# Patient Record
Sex: Male | Born: 1960 | Race: Black or African American | Hispanic: No | Smoking: Never smoker
Health system: Southern US, Community
[De-identification: ages and names within clinical notes are randomized; demographics above are authoritative.]

## PROBLEM LIST (undated history)

## (undated) DIAGNOSIS — F419 Anxiety disorder, unspecified: Secondary | ICD-10-CM

## (undated) DIAGNOSIS — I10 Essential (primary) hypertension: Secondary | ICD-10-CM

## (undated) DIAGNOSIS — R21 Rash and other nonspecific skin eruption: Secondary | ICD-10-CM

## (undated) DIAGNOSIS — N529 Male erectile dysfunction, unspecified: Secondary | ICD-10-CM

## (undated) HISTORY — DX: Rash and other nonspecific skin eruption: R21

## (undated) HISTORY — DX: Male erectile dysfunction, unspecified: N52.9

## (undated) HISTORY — DX: Essential (primary) hypertension: I10

## (undated) HISTORY — DX: Anxiety disorder, unspecified: F41.9

---

## 2016-09-29 DIAGNOSIS — R0902 Hypoxemia: Secondary | ICD-10-CM | POA: Diagnosis not present

## 2016-09-30 DIAGNOSIS — D571 Sickle-cell disease without crisis: Secondary | ICD-10-CM | POA: Diagnosis not present

## 2016-09-30 DIAGNOSIS — R0902 Hypoxemia: Secondary | ICD-10-CM | POA: Diagnosis not present

## 2016-10-04 DIAGNOSIS — Q8901 Asplenia (congenital): Secondary | ICD-10-CM | POA: Diagnosis not present

## 2016-10-04 DIAGNOSIS — R040 Epistaxis: Secondary | ICD-10-CM | POA: Diagnosis not present

## 2016-10-04 DIAGNOSIS — D571 Sickle-cell disease without crisis: Secondary | ICD-10-CM | POA: Diagnosis not present

## 2016-10-04 DIAGNOSIS — R931 Abnormal findings on diagnostic imaging of heart and coronary circulation: Secondary | ICD-10-CM | POA: Diagnosis not present

## 2016-10-04 DIAGNOSIS — Z7722 Contact with and (suspected) exposure to environmental tobacco smoke (acute) (chronic): Secondary | ICD-10-CM | POA: Diagnosis not present

## 2016-10-04 DIAGNOSIS — D5740 Sickle-cell thalassemia without crisis: Secondary | ICD-10-CM | POA: Diagnosis not present

## 2016-10-04 DIAGNOSIS — J4520 Mild intermittent asthma, uncomplicated: Secondary | ICD-10-CM | POA: Diagnosis not present

## 2016-11-02 DIAGNOSIS — Z5189 Encounter for other specified aftercare: Secondary | ICD-10-CM | POA: Diagnosis not present

## 2016-11-02 DIAGNOSIS — Q8901 Asplenia (congenital): Secondary | ICD-10-CM | POA: Diagnosis not present

## 2016-11-02 DIAGNOSIS — D571 Sickle-cell disease without crisis: Secondary | ICD-10-CM | POA: Diagnosis not present

## 2016-11-02 DIAGNOSIS — R040 Epistaxis: Secondary | ICD-10-CM | POA: Diagnosis not present

## 2016-11-29 DIAGNOSIS — D571 Sickle-cell disease without crisis: Secondary | ICD-10-CM | POA: Diagnosis not present

## 2016-11-30 DIAGNOSIS — D571 Sickle-cell disease without crisis: Secondary | ICD-10-CM | POA: Diagnosis not present

## 2016-12-11 DIAGNOSIS — R1110 Vomiting, unspecified: Secondary | ICD-10-CM | POA: Diagnosis not present

## 2016-12-11 DIAGNOSIS — R040 Epistaxis: Secondary | ICD-10-CM | POA: Diagnosis not present

## 2016-12-11 DIAGNOSIS — R509 Fever, unspecified: Secondary | ICD-10-CM | POA: Diagnosis not present

## 2016-12-11 DIAGNOSIS — R0981 Nasal congestion: Secondary | ICD-10-CM | POA: Diagnosis not present

## 2016-12-26 DIAGNOSIS — D571 Sickle-cell disease without crisis: Secondary | ICD-10-CM | POA: Diagnosis not present

## 2016-12-27 DIAGNOSIS — R040 Epistaxis: Secondary | ICD-10-CM | POA: Diagnosis not present

## 2016-12-27 DIAGNOSIS — Z5189 Encounter for other specified aftercare: Secondary | ICD-10-CM | POA: Diagnosis not present

## 2016-12-27 DIAGNOSIS — D571 Sickle-cell disease without crisis: Secondary | ICD-10-CM | POA: Diagnosis not present

## 2016-12-27 DIAGNOSIS — Q8901 Asplenia (congenital): Secondary | ICD-10-CM | POA: Diagnosis not present

## 2016-12-28 DIAGNOSIS — D571 Sickle-cell disease without crisis: Secondary | ICD-10-CM | POA: Diagnosis not present

## 2017-01-24 DIAGNOSIS — Q8901 Asplenia (congenital): Secondary | ICD-10-CM | POA: Diagnosis not present

## 2017-01-24 DIAGNOSIS — Z79899 Other long term (current) drug therapy: Secondary | ICD-10-CM | POA: Diagnosis not present

## 2017-01-24 DIAGNOSIS — D571 Sickle-cell disease without crisis: Secondary | ICD-10-CM | POA: Diagnosis not present

## 2017-01-24 DIAGNOSIS — R9439 Abnormal result of other cardiovascular function study: Secondary | ICD-10-CM | POA: Diagnosis not present

## 2017-01-24 DIAGNOSIS — R931 Abnormal findings on diagnostic imaging of heart and coronary circulation: Secondary | ICD-10-CM | POA: Diagnosis not present

## 2017-01-24 DIAGNOSIS — Z52008 Unspecified donor, other blood: Secondary | ICD-10-CM | POA: Diagnosis not present

## 2017-01-25 DIAGNOSIS — Z79899 Other long term (current) drug therapy: Secondary | ICD-10-CM | POA: Diagnosis not present

## 2017-01-25 DIAGNOSIS — D571 Sickle-cell disease without crisis: Secondary | ICD-10-CM | POA: Diagnosis not present

## 2017-01-25 DIAGNOSIS — Z52008 Unspecified donor, other blood: Secondary | ICD-10-CM | POA: Diagnosis not present

## 2017-01-25 DIAGNOSIS — Q8901 Asplenia (congenital): Secondary | ICD-10-CM | POA: Diagnosis not present

## 2017-01-25 DIAGNOSIS — R9439 Abnormal result of other cardiovascular function study: Secondary | ICD-10-CM | POA: Diagnosis not present

## 2017-03-30 ENCOUNTER — Emergency Department (HOSPITAL_BASED_OUTPATIENT_CLINIC_OR_DEPARTMENT_OTHER): Payer: BLUE CROSS/BLUE SHIELD

## 2017-03-30 ENCOUNTER — Encounter (HOSPITAL_BASED_OUTPATIENT_CLINIC_OR_DEPARTMENT_OTHER): Payer: Self-pay

## 2017-03-30 ENCOUNTER — Other Ambulatory Visit: Payer: Self-pay

## 2017-03-30 ENCOUNTER — Emergency Department (HOSPITAL_BASED_OUTPATIENT_CLINIC_OR_DEPARTMENT_OTHER)
Admission: EM | Admit: 2017-03-30 | Discharge: 2017-03-30 | Disposition: A | Discharge status: Home / Self Care | Payer: BLUE CROSS/BLUE SHIELD | Attending: Emergency Medicine | Admitting: Emergency Medicine

## 2017-03-30 DIAGNOSIS — Y939 Activity, unspecified: Secondary | ICD-10-CM | POA: Diagnosis not present

## 2017-03-30 DIAGNOSIS — S300XXA Contusion of lower back and pelvis, initial encounter: Secondary | ICD-10-CM | POA: Insufficient documentation

## 2017-03-30 DIAGNOSIS — Y929 Unspecified place or not applicable: Secondary | ICD-10-CM | POA: Diagnosis not present

## 2017-03-30 DIAGNOSIS — Y999 Unspecified external cause status: Secondary | ICD-10-CM | POA: Insufficient documentation

## 2017-03-30 DIAGNOSIS — S8991XA Unspecified injury of right lower leg, initial encounter: Secondary | ICD-10-CM | POA: Insufficient documentation

## 2017-03-30 DIAGNOSIS — W109XXA Fall (on) (from) unspecified stairs and steps, initial encounter: Secondary | ICD-10-CM | POA: Diagnosis not present

## 2017-03-30 DIAGNOSIS — S301XXA Contusion of abdominal wall, initial encounter: Secondary | ICD-10-CM | POA: Diagnosis not present

## 2017-03-30 DIAGNOSIS — M25461 Effusion, right knee: Secondary | ICD-10-CM | POA: Diagnosis not present

## 2017-03-30 DIAGNOSIS — M25462 Effusion, left knee: Secondary | ICD-10-CM | POA: Diagnosis not present

## 2017-03-30 MED ORDER — NAPROXEN 250 MG PO TABS
500.0000 mg | ORAL_TABLET | Freq: Once | ORAL | Status: AC
  Administered 2017-03-30: 500 mg via ORAL
  Filled 2017-03-30: qty 2

## 2017-03-30 MED ORDER — NAPROXEN 500 MG PO TABS: ORAL_TABLET | ORAL | 0 refills | Status: DC

## 2017-03-30 NOTE — ED Triage Notes (Signed)
Pt tripped up the stairs two nights ago, c/o bilateral knee pain, walks with slight limp, last took ibuprofen Wednesday morning

## 2017-03-30 NOTE — ED Provider Notes (Signed)
[facility] Provider Note: Lowella DellJ. Lane Javious Hallisey, MD, FACEP  CSN: [id number] MRN: [id number] ARRIVAL: 03/30/17 at 0251 ROOM: [facility]   CHIEF COMPLAINT  Fall   HISTORY OF PRESENT ILLNESS  03/30/17 3:02 AM Scarlette CalicoRoderick Laventure is a 57 y.o. male who tripped on the stairs and fell to nights ago.  He is complaining of moderate, throbbing right knee pain that is worse with flexion and extension or weightbearing.  The pain is located primarily in the posterior medial aspect of the knee.  The knee is not unstable.  He is also having pain in his left iliac crest.  He has been taking ibuprofen intermittently without adequate relief of the pain.  He denies neck pain or back pain.   History reviewed. No pertinent past medical history.  History reviewed. No pertinent surgical history.  No family history on file.  Social History   Tobacco Use  . Smoking status: Not on file  Substance Use Topics  . Alcohol use: Not on file  . Drug use: Not on file    Prior to Admission medications   Not on File    Allergies Patient has no known allergies.   REVIEW OF SYSTEMS  Negative except as noted here or in the History of Present Illness.   PHYSICAL EXAMINATION  Initial Vital Signs Blood pressure (!) 182/122, pulse 90, temperature 99 F (37.2 C), temperature source Oral, resp. rate 20, SpO2 97 %.  Examination General: Well-developed, well-nourished male in no acute distress; appearance consistent with age of record HENT: normocephalic; atraumatic Eyes: pupils equal, round and reactive to light; extraocular muscles intact Neck: supple; nontender Heart: regular rate and rhythm Lungs: clear to auscultation bilaterally Abdomen: soft; nondistended; nontender; bowel sounds present Extremities: No deformity; right knee stable but with posterior medial pain on passive range of motion; pulses normal; tenderness over left iliac crest without deformity or crepitus Neurologic: Awake, alert and  oriented; motor function intact in all extremities and symmetric; no facial droop Skin: Warm and dry Psychiatric: Normal mood and affect   RESULTS  Summary of this visit's results, reviewed by myself:   EKG Interpretation  Date/Time:    Ventricular Rate:    PR Interval:    QRS Duration:   QT Interval:    QTC Calculation:   R Axis:     Text Interpretation:        Laboratory Studies: No results found for this or any previous visit (from the past 24 hour(s)). Imaging Studies: Dg Knee Complete 4 Views Left  Result Date: 03/30/2017 CLINICAL DATA:  Left knee pain after fall last night. EXAM: LEFT KNEE - COMPLETE 4+ VIEW COMPARISON:  None. FINDINGS: No fracture or dislocation. Moderate joint effusion. Tricompartmental osteoarthritis with peripheral spurring. Lobular osseous density projecting medially from the proximal fibula may be an osteochondroma or sequela of remote prior injury. Possible intra-articular bodies posteriorly versus capsular calcification. IMPRESSION: 1. Moderate joint effusion without acute fracture. 2. Mild to moderate tricompartmental osteoarthritis. 3. Lobular osseous density projecting medially from the proximal fibula may be an osteochondroma versus sequela of remote prior injury. Electronically Signed   By: Rubye OaksMelanie  Ehinger M.D.   On: 03/30/2017 03:37   Dg Knee Complete 4 Views Right  Result Date: 03/30/2017 CLINICAL DATA:  Fall last evening with anterior and lateral right knee pain. EXAM: RIGHT KNEE - COMPLETE 4+ VIEW COMPARISON:  None. FINDINGS: No evidence of fracture or dislocation. Moderate joint effusion. Tricompartmental osteoarthritis with peripheral spurring and mild lateral tibiofemoral joint space  narrowing. There is soft tissue edema anteriorly. IMPRESSION: 1. Joint effusion without acute fracture. 2. Mild-to-moderate tricompartmental osteoarthritis. Electronically Signed   By: Rubye Oaks M.D.   On: 03/30/2017 03:35    ED COURSE  Nursing notes  and initial vitals signs, including pulse oximetry, reviewed.  Vitals:   03/30/17 0254  BP: (!) 182/122  Pulse: 90  Resp: 20  Temp: 99 F (37.2 C)  TempSrc: Oral  SpO2: 97%   Will place in right knee immobilizer and treat with NSAIDs.  Will refer to sports medicine.  PROCEDURES    ED DIAGNOSES     ICD-10-CM   1. Fall on stairs, initial encounter W10.9XXA   2. Right knee injury, initial encounter S89.91XA   3. Contusion of iliac crest, initial encounter S30.1XXA        Barclay Lennox, Jonny Ruiz, MD 03/30/17 [phone]

## 2018-05-30 ENCOUNTER — Emergency Department (HOSPITAL_COMMUNITY)
Admission: EM | Admit: 2018-05-30 | Discharge: 2018-05-30 | Disposition: A | Discharge status: Home / Self Care | Payer: BC Managed Care – PPO | Attending: Emergency Medicine | Admitting: Emergency Medicine

## 2018-05-30 ENCOUNTER — Other Ambulatory Visit: Payer: Self-pay

## 2018-05-30 ENCOUNTER — Emergency Department (HOSPITAL_COMMUNITY): Payer: BC Managed Care – PPO

## 2018-05-30 ENCOUNTER — Encounter (HOSPITAL_COMMUNITY): Payer: Self-pay | Admitting: Emergency Medicine

## 2018-05-30 DIAGNOSIS — R05 Cough: Secondary | ICD-10-CM | POA: Diagnosis not present

## 2018-05-30 DIAGNOSIS — Z20828 Contact with and (suspected) exposure to other viral communicable diseases: Secondary | ICD-10-CM | POA: Diagnosis not present

## 2018-05-30 DIAGNOSIS — J069 Acute upper respiratory infection, unspecified: Secondary | ICD-10-CM | POA: Diagnosis not present

## 2018-05-30 DIAGNOSIS — R0602 Shortness of breath: Secondary | ICD-10-CM | POA: Diagnosis not present

## 2018-05-30 DIAGNOSIS — I10 Essential (primary) hypertension: Secondary | ICD-10-CM | POA: Insufficient documentation

## 2018-05-30 DIAGNOSIS — B9789 Other viral agents as the cause of diseases classified elsewhere: Secondary | ICD-10-CM | POA: Diagnosis not present

## 2018-05-30 LAB — CBC WITH DIFFERENTIAL/PLATELET
Abs Immature Granulocytes: 0 10*3/uL (ref 0.00–0.07)
Basophils Absolute: 0 10*3/uL (ref 0.0–0.1)
Basophils Relative: 0 %
Eosinophils Absolute: 0.1 10*3/uL (ref 0.0–0.5)
Eosinophils Relative: 1 %
HCT: 47 % (ref 39.0–52.0)
Hemoglobin: 15.5 g/dL (ref 13.0–17.0)
Immature Granulocytes: 0 %
Lymphocytes Relative: 54 %
Lymphs Abs: 2.9 10*3/uL (ref 0.7–4.0)
MCH: 31.3 pg (ref 26.0–34.0)
MCHC: 33 g/dL (ref 30.0–36.0)
MCV: 94.9 fL (ref 80.0–100.0)
Monocytes Absolute: 0.4 10*3/uL (ref 0.1–1.0)
Monocytes Relative: 7 %
Neutro Abs: 2.1 10*3/uL (ref 1.7–7.7)
Neutrophils Relative %: 38 %
Platelets: 296 10*3/uL (ref 150–400)
RBC: 4.95 MIL/uL (ref 4.22–5.81)
RDW: 13.2 % (ref 11.5–15.5)
WBC: 5.4 10*3/uL (ref 4.0–10.5)
nRBC: 0 % (ref 0.0–0.2)

## 2018-05-30 LAB — COMPREHENSIVE METABOLIC PANEL
ALT: 36 U/L (ref 0–44)
AST: 26 U/L (ref 15–41)
Albumin: 4.5 g/dL (ref 3.5–5.0)
Alkaline Phosphatase: 51 U/L (ref 38–126)
Anion gap: 8 (ref 5–15)
BUN: 13 mg/dL (ref 6–20)
CO2: 28 mmol/L (ref 22–32)
Calcium: 9.4 mg/dL (ref 8.9–10.3)
Chloride: 103 mmol/L (ref 98–111)
Creatinine, Ser: 1.12 mg/dL (ref 0.61–1.24)
GFR calc Af Amer: >60 mL/min (ref >60)
GFR calc non Af Amer: >60 mL/min (ref >60)
Glucose, Bld: 116 mg/dL — ABNORMAL HIGH (ref 70–99)
Potassium: 4.1 mmol/L (ref 3.5–5.1)
Sodium: 139 mmol/L (ref 135–145)
Total Bilirubin: 0.6 mg/dL (ref 0.3–1.2)
Total Protein: 8 g/dL (ref 6.5–8.1)

## 2018-05-30 LAB — I-STAT TROPONIN, ED: Troponin i, poc: 0 ng/mL (ref 0.00–0.08)

## 2018-05-30 LAB — SARS CORONAVIRUS 2 BY RT PCR (HOSPITAL ORDER, PERFORMED IN ~~LOC~~ HOSPITAL LAB): SARS Coronavirus 2: NEGATIVE

## 2018-05-30 NOTE — ED Triage Notes (Signed)
Patient c/o intermittent SOB and chest pain with non productive cough x1 week. Denies N/V/D and fevers.

## 2018-05-30 NOTE — ED Provider Notes (Signed)
[facility] Note   CSN: [id number] Arrival date & time: 05/30/18  1508    History   Chief Complaint Chief Complaint  Patient presents with  . Shortness of Breath    HPI William Ellison is a 58 y.o. male here presenting with shortness of breath.  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  For the last 3 to 4 days, he has been having some congestion.  He states that he has a nonproductive cough.  He has some subjective shortness of breath as well but denies any fevers.  He denies any sick contacts with known COVID.  He denies any recent travels.  Denies any medical problems and denies any lung disease in particular.      The history is provided by the patient.    History reviewed. No pertinent past medical history.  There are no active problems to display for this patient.   History reviewed. No pertinent surgical history.      Home Medications    Prior to Admission medications   Medication Sig Start Date End Date Taking? Authorizing Provider  naproxen (NAPROSYN) 500 MG tablet Take 1 tablet twice daily as needed for pain. Patient not taking: Reported on 05/30/2018 03/30/17   Molpus, Jonny RuizJohn, MD    Family History No family history on file.  Social History Social History   Tobacco Use  . Smoking status: Not on file  Substance Use Topics  . Alcohol use: Not on file  . Drug use: Not on file     Allergies   Patient has no known allergies.   Review of Systems Review of Systems  Respiratory: Positive for shortness of breath.   All other systems reviewed and are negative.    Physical Exam Updated Vital Signs BP (!) 155/107   Pulse 73   Temp 98.6 F (37 C) (Oral)   Resp 16   Ht 5\' 10"  (1.778 m)   Wt 120.2 kg   SpO2 98%   BMI 38.02 kg/m   Physical Exam Vitals signs and nursing note reviewed.  HENT:     Head: Normocephalic.     Mouth/Throat:     Mouth: Mucous membranes are  moist.  Eyes:     Pupils: Pupils are equal, round, and reactive to light.  Neck:     Musculoskeletal: Normal range of motion.  Cardiovascular:     Rate and Rhythm: Normal rate and regular rhythm.  Pulmonary:     Effort: Pulmonary effort is normal.     Breath sounds: Normal breath sounds.  Abdominal:     General: Bowel sounds are normal.     Palpations: Abdomen is soft.  Musculoskeletal: Normal range of motion.  Skin:    General: Skin is warm.     Capillary Refill: Capillary refill takes less than 2 seconds.  Neurological:     General: No focal deficit present.     Mental Status: He is alert and oriented to person, place, and time.  Psychiatric:        Mood and Affect: Mood normal.        Behavior: Behavior normal.      ED Treatments / Results  Labs (all labs ordered are listed, but only abnormal results are displayed) Labs Reviewed  COMPREHENSIVE METABOLIC PANEL - Abnormal; Notable for the following components:      Result Value   Glucose, Bld 116 (*)    All other components within  normal limits  SARS CORONAVIRUS 2 (HOSPITAL ORDER, PERFORMED IN [facility])  CBC WITH DIFFERENTIAL/PLATELET  I-STAT TROPONIN, ED    EKG EKG Interpretation  Date/Time:  Wednesday May 30 2018 15:34:20 EDT Ventricular Rate:  84 PR Interval:    QRS Duration: 90 QT Interval:  339 QTC Calculation: 401 R Axis:   -17 Text Interpretation:  Sinus rhythm Borderline left axis deviation Borderline T wave abnormalities No previous ECGs available Confirmed by Richardean Canal ([id number]) on 05/30/2018 3:41:40 PM   Radiology Dg Chest Port 1 View  Result Date: 05/30/2018 CLINICAL DATA:  Shortness of breath EXAM: PORTABLE CHEST 1 VIEW COMPARISON:  None. FINDINGS: The heart size and mediastinal contours are within normal limits. Both lungs are clear. The visualized skeletal structures are unremarkable. IMPRESSION: No active disease. Electronically Signed   By: Katherine Mantle M.D.   On:  05/30/2018 16:02    Procedures Procedures (including critical care time)  Medications Ordered in ED Medications - No data to display   Initial Impression / Assessment and Plan / ED Course  I have reviewed the triage vital signs and the nursing notes.  Pertinent labs & imaging results that were available during my care of the patient were reviewed by me and considered in my medical decision making (see chart for details).       William Ellison is a 58 y.o. male here with SOB. Likely viral URI vs pneumonia. Low risk for COVID but given the pandemic, will test for COVID. Will get labs, CXR, COVID. Normal O2 saturation currently.   6:13 PM Labs and CXR and COVID negative. Stable for discharge. Likely viral URI.     Final Clinical Impressions(s) / ED Diagnoses   Final diagnoses:  None    ED Discharge Orders    None       Charlynne Pander, MD 05/30/18 [phone]

## 2018-05-30 NOTE — Discharge Instructions (Signed)
You likely have viral infection. Your COVID testing is negative today   Your blood pressure is elevated and follow up with your doctor for recheck   Return to ER if you have worse cough, chest pain, trouble breathing, fever

## 2018-06-08 ENCOUNTER — Telehealth: Payer: Self-pay

## 2018-06-08 NOTE — Telephone Encounter (Signed)
Left a vm for patient to callback 

## 2018-06-11 ENCOUNTER — Ambulatory Visit: Payer: BLUE CROSS/BLUE SHIELD | Admitting: Family Medicine

## 2018-06-19 ENCOUNTER — Telehealth: Payer: Self-pay

## 2018-06-19 NOTE — Telephone Encounter (Signed)
Patient was negative for screening and will be coming in office 

## 2018-06-20 ENCOUNTER — Encounter (HOSPITAL_COMMUNITY): Payer: Self-pay

## 2018-06-20 ENCOUNTER — Ambulatory Visit (INDEPENDENT_AMBULATORY_CARE_PROVIDER_SITE_OTHER): Payer: BC Managed Care – PPO | Admitting: Family Medicine

## 2018-06-20 ENCOUNTER — Encounter: Payer: Self-pay | Admitting: Family Medicine

## 2018-06-20 ENCOUNTER — Emergency Department (HOSPITAL_COMMUNITY)
Admission: EM | Admit: 2018-06-20 | Discharge: 2018-06-20 | Disposition: A | Discharge status: Home / Self Care | Payer: BC Managed Care – PPO | Attending: Emergency Medicine | Admitting: Emergency Medicine

## 2018-06-20 ENCOUNTER — Other Ambulatory Visit: Payer: Self-pay

## 2018-06-20 VITALS — BP 188/110 | HR 86 | Temp 98.0°F | Ht 70.0 in | Wt 296.0 lb

## 2018-06-20 DIAGNOSIS — Z6841 Body Mass Index (BMI) 40.0 and over, adult: Secondary | ICD-10-CM

## 2018-06-20 DIAGNOSIS — Z09 Encounter for follow-up examination after completed treatment for conditions other than malignant neoplasm: Secondary | ICD-10-CM | POA: Diagnosis not present

## 2018-06-20 DIAGNOSIS — I160 Hypertensive urgency: Secondary | ICD-10-CM

## 2018-06-20 DIAGNOSIS — E66813 Obesity, class 3: Secondary | ICD-10-CM | POA: Insufficient documentation

## 2018-06-20 DIAGNOSIS — Z131 Encounter for screening for diabetes mellitus: Secondary | ICD-10-CM

## 2018-06-20 DIAGNOSIS — R079 Chest pain, unspecified: Secondary | ICD-10-CM | POA: Diagnosis not present

## 2018-06-20 DIAGNOSIS — I10 Essential (primary) hypertension: Secondary | ICD-10-CM

## 2018-06-20 DIAGNOSIS — R21 Rash and other nonspecific skin eruption: Secondary | ICD-10-CM | POA: Insufficient documentation

## 2018-06-20 DIAGNOSIS — Z7689 Persons encountering health services in other specified circumstances: Secondary | ICD-10-CM

## 2018-06-20 DIAGNOSIS — R030 Elevated blood-pressure reading, without diagnosis of hypertension: Secondary | ICD-10-CM | POA: Insufficient documentation

## 2018-06-20 DIAGNOSIS — N529 Male erectile dysfunction, unspecified: Secondary | ICD-10-CM | POA: Insufficient documentation

## 2018-06-20 LAB — POCT URINALYSIS DIP (MANUAL ENTRY)
Bilirubin, UA: NEGATIVE
Glucose, UA: NEGATIVE mg/dL
Ketones, POC UA: NEGATIVE mg/dL
Leukocytes, UA: NEGATIVE
Nitrite, UA: NEGATIVE
Protein Ur, POC: NEGATIVE mg/dL
Spec Grav, UA: 1.025 (ref 1.010–1.025)
Urobilinogen, UA: 0.2 E.U./dL
pH, UA: 5.5 (ref 5.0–8.0)

## 2018-06-20 LAB — BASIC METABOLIC PANEL
Anion gap: 8 (ref 5–15)
BUN: 21 mg/dL — ABNORMAL HIGH (ref 6–20)
CO2: 26 mmol/L (ref 22–32)
Calcium: 8.7 mg/dL — ABNORMAL LOW (ref 8.9–10.3)
Chloride: 104 mmol/L (ref 98–111)
Creatinine, Ser: 1.23 mg/dL (ref 0.61–1.24)
GFR calc Af Amer: >60 mL/min (ref >60)
GFR calc non Af Amer: >60 mL/min (ref >60)
Glucose, Bld: 113 mg/dL — ABNORMAL HIGH (ref 70–99)
Potassium: 4.2 mmol/L (ref 3.5–5.1)
Sodium: 138 mmol/L (ref 135–145)

## 2018-06-20 LAB — POCT GLYCOSYLATED HEMOGLOBIN (HGB A1C): Hemoglobin A1C: 6.1 % — AB (ref 4.0–5.6)

## 2018-06-20 MED ORDER — HYDROCORTISONE 1 % EX CREA: 1.0000 "application " | TOPICAL_CREAM | Freq: Two times a day (BID) | CUTANEOUS | 3 refills | Status: DC

## 2018-06-20 MED ORDER — CLONIDINE HCL 0.1 MG PO TABS
0.1000 mg | ORAL_TABLET | Freq: Once | ORAL | Status: AC
  Administered 2018-06-20: 0.1 mg via ORAL

## 2018-06-20 MED ORDER — AMLODIPINE BESYLATE 5 MG PO TABS: 5.0000 mg | ORAL_TABLET | Freq: Every day | ORAL | 0 refills | Status: DC

## 2018-06-20 MED ORDER — TADALAFIL 10 MG PO TABS: 10.0000 mg | ORAL_TABLET | Freq: Every day | ORAL | 0 refills | Status: DC | PRN

## 2018-06-20 NOTE — ED Triage Notes (Signed)
Pt BIB EMS from PCP with BP 180/110. Pt was given 0.3 mg of clonidine before he left and BP is now 140/86. Pt is not symptomatic just sent here by PCP.  HR 80  18 100% RA CBG 115  20G LAC

## 2018-06-20 NOTE — ED Provider Notes (Signed)
[facility] DEPT Provider Note   CSN: [id number] Arrival date & time: 06/20/18  1142    History   Chief Complaint Chief Complaint  Patient presents with  . Hypertension    HPI William Ellison is a 58 y.o. male resenting today from primary care provider's office for hypertension.  Brought by EMS after primary care noted patient's blood pressure to be 180/110, he was given 0.3 mg of clonidine and blood pressure has since decreased to 140/86 on arrival.  Review of primary care provider's note reveals that patient had chest discomfort 1 week ago without visual changes, cough, shortness of breath, heart palpitations or falls, occasional headache and dizziness with positional changes.  He was diagnosed with hypertensive urgency and sent to the ER. - On my initial evaluation patient is resting comfortably in no acute distress blood pressure has improved.  Patient reports that he is asymptomatic throughout the day.  I discussed the above previous review of symptoms with the patient including the chest discomfort mentioned above, he states that he was not having chest pain at all, he states that he had chest congestion for which he was diagnosed with a URI on May 30, 2018 here in the ER and that chest congestion had lingered for some time and has since resolved.  He denies any chest pain, shortness of breath, visual symptoms or any additional concerns.  Additionally he denies to me any headache or dizziness.  He states that he is feeling well today and at his baseline.    HPI  Past Medical History:  Diagnosis Date  . Erectile dysfunction   . Hypertension   . Skin rash     Patient Active Problem List   Diagnosis Date Noted  . Blood pressure elevated without history of HTN 06/20/2018  . Class 3 severe obesity due to excess calories with serious comorbidity and body mass index (BMI) of 40.0 to 44.9 in adult ([place]) 06/20/2018  . Erectile dysfunction 06/20/2018  .  Skin rash 06/20/2018    History reviewed. No pertinent surgical history.      Home Medications    Prior to Admission medications   Medication Sig Start Date End Date Taking? Authorizing Provider  amLODipine (NORVASC) 5 MG tablet Take 1 tablet (5 mg total) by mouth daily. 06/20/18   Nuala Alpha A, PA-C  hydrocortisone cream 1 % Apply 1 application topically 2 (two) times daily. 06/20/18   Azzie Glatter, FNP  naproxen (NAPROSYN) 500 MG tablet Take 1 tablet twice daily as needed for pain. Patient not taking: Reported on 05/30/2018 03/30/17   Molpus, Jenny Reichmann, MD  tadalafil (CIALIS) 10 MG tablet Take 1 tablet (10 mg total) by mouth daily as needed for erectile dysfunction. 06/20/18   Azzie Glatter, FNP    Family History Family History  Problem Relation Age of Onset  . Hypertension Mother     Social History Social History   Tobacco Use  . Smoking status: Never Smoker  . Smokeless tobacco: Never Used  Substance Use Topics  . Alcohol use: Yes  . Drug use: Never     Allergies   Patient has no known allergies.   Review of Systems Review of Systems  Constitutional: Negative.  Negative for chills and fever.  Eyes: Negative.  Negative for visual disturbance.  Respiratory: Negative.  Negative for cough and shortness of breath.   Cardiovascular: Negative.  Negative for chest pain, palpitations and leg swelling.  Gastrointestinal: Negative.  Negative for abdominal pain,  nausea and vomiting.  Neurological: Negative.  Negative for dizziness, syncope, weakness and headaches.  All other systems reviewed and are negative.  Physical Exam Updated Vital Signs BP (!) 137/91   Pulse 73   Temp 98.5 F (36.9 C) (Oral)   Resp 19   Ht 5\' 10"  (1.778 m)   Wt 134.2 kg   SpO2 98%   BMI 42.45 kg/m   Physical Exam Constitutional:      General: He is not in acute distress.    Appearance: Normal appearance. He is well-developed. He is not ill-appearing or diaphoretic.  HENT:      Head: Normocephalic and atraumatic.     Right Ear: External ear normal.     Left Ear: External ear normal.     Nose: Nose normal.  Eyes:     General: Vision grossly intact. Gaze aligned appropriately.     Pupils: Pupils are equal, round, and reactive to light.  Neck:     Musculoskeletal: Normal range of motion.     Trachea: Trachea and phonation normal. No tracheal deviation.  Cardiovascular:     Rate and Rhythm: Normal rate and regular rhythm.     Pulses: Normal pulses.     Heart sounds: Normal heart sounds.  Pulmonary:     Effort: Pulmonary effort is normal. No accessory muscle usage or respiratory distress.     Breath sounds: Normal breath sounds and air entry.  Abdominal:     General: There is no distension.     Palpations: Abdomen is soft.     Tenderness: There is no abdominal tenderness. There is no guarding or rebound.  Musculoskeletal: Normal range of motion.  Skin:    General: Skin is warm and dry.  Neurological:     Mental Status: He is alert.     GCS: GCS eye subscore is 4. GCS verbal subscore is 5. GCS motor subscore is 6.     Comments: Speech is clear and goal oriented, follows commands Major Cranial nerves without deficit, no facial droop Moves extremities without ataxia, coordination intact  Psychiatric:        Behavior: Behavior normal.      ED Treatments / Results  Labs (all labs ordered are listed, but only abnormal results are displayed) Labs Reviewed  BASIC METABOLIC PANEL - Abnormal; Notable for the following components:      Result Value   Glucose, Bld 113 (*)    BUN 21 (*)    Calcium 8.7 (*)    All other components within normal limits    EKG EKG Interpretation  Date/Time:  Wednesday June 20 2018 11:58:51 EDT Ventricular Rate:  84 PR Interval:    QRS Duration: 93 QT Interval:  365 QTC Calculation: 432 R Axis:   -40 Text Interpretation:  Sinus rhythm Left axis deviation Abnormal R-wave progression, late transition Minimal ST depression,  inferior leads No significant change since last tracing Confirmed by Lorre NickAllen, Anthony ([id number]) on 06/20/2018 12:11:28 PM   Radiology No results found.  Procedures Procedures (including critical care time)  Medications Ordered in ED Medications - No data to display   Initial Impression / Assessment and Plan / ED Course  I have reviewed the triage vital signs and the nursing notes.  Pertinent labs & imaging results that were available during my care of the patient were reviewed by me and considered in my medical decision making (see chart for details).  Clinical Course as of Jun 19 1301  Wed Jun 20, 2018  1247 Amlodipine 5mg    [BM]    Clinical Course User Index [BM] Bill SalinasMorelli, Lynita Groseclose A, PA-C   Patient here with asymptomatic hypertension resolved following 0.3 mg clonidine at primary care doctor's office.  Will check EKG and BMP during ED visit. - BMP with mildly elevated glucose, nonacute, no evidence of kidney injury EKG Sinus rhythm Left axis deviation Abnormal R-wave progression, late transition Minimal ST depression, inferior leads No significant change since last tracing Confirmed by Lorre NickAllen, Anthony - As patient is without history of chest pain, shortness of breath, headache/vision changes or other signs of hypertensive emergency/urgency no further work-up is indicated here in the ER.  Repeat blood pressure 137/91.  Will prescribe patient amlodipine 5 mg daily encourage PCP follow-up within 1 week for blood pressure recheck and medication management. I have spoken with the patient regarding elevated blood pressure readings and the need for improved management. I also counseled the patient regarding the signs and symptoms which would require an emergent visit to an emergency department for hypertensive urgency and/or hypertensive emergency.  At this time there does not appear to be any evidence of an acute emergency medical condition and the patient appears stable for discharge with  appropriate outpatient follow up. Diagnosis was discussed with patient who verbalizes understanding of care plan and is agreeable to discharge. I have discussed return precautions with patient who verbalizes understanding of return precautions. Patient encouraged to follow-up with their PCP. All questions answered.  Patient has been discharged in good condition.  Patient's case discussed with Dr. Freida BusmanAllen who has seen and evaluated the patient and agrees with plan to discharge with amlodipine 5 mg and PCP follow-up.   Note: Portions of this report may have been transcribed using voice recognition software. Every effort was made to ensure accuracy; however, inadvertent computerized transcription errors may still be present. Final Clinical Impressions(s) / ED Diagnoses   Final diagnoses:  Asymptomatic hypertension    ED Discharge Orders         Ordered    amLODipine (NORVASC) 5 MG tablet  Daily     06/20/18 1258           Elizabeth PalauMorelli, Lurena Naeve A, PA-C 06/20/18 1313    Lorre NickAllen, Anthony, MD 06/21/18 [phone]

## 2018-06-20 NOTE — Progress Notes (Signed)
[facility]    Established Patient Office Visit  Subjective:  Patient ID: William Ellison, male    DOB: 1960/01/16  Age: 58 y.o. MRN: [id number]  CC:  Chief Complaint  Patient presents with  . Establish Care  . Hospitalization Follow-up  . Rash    right knee    HPI William Ellison is a 58 year old male who presents for Hospital Follow Up and to Establish Care today.   Past Medical History:  Diagnosis Date  . Erectile dysfunction   . Hypertension   . Skin rash     Current Status: This will be my initial office visit with William Ellison . Sice his last ED visit on 05/30/2018 for URI and Cough. He was briefly a previous patient of [facility] where he was diagnosed with Essential Hypertension on 07/26/2017. He is no longer there, because he was lost to follow up. He state that he was diagnosed by nurse at his job and given Rx for Hypertension last, which he never picked up Rx.  He reports that he did have mild chest discomfort 1 week ago. He denies visual changes, cough, shortness of breath, heart palpitations, and falls. He has occasional headaches and dizziness with position changes. Denies severe headaches, confusion, seizures, double vision, and blurred vision, nausea and vomiting.  He denies fevers, chills, fatigue, recent infections, weight loss, and night sweats. No reports of GI problems such as diarrhea, and constipation. He has no reports of blood in stools, dysuria and hematuria. No depression or anxiety reported. He denies pain today.   No past surgical history on file.  Family History  Problem Relation Age of Onset  . Hypertension Mother     Social History   Socioeconomic History  . Marital status: Significant Other    Spouse name: Not on file  . Number of children: Not on file  . Years of education: Not on file  . Highest education level: Not on file  Occupational History  . Not on file  Social  Needs  . Financial resource strain: Not on file  . Food insecurity:    Worry: Not on file    Inability: Not on file  . Transportation needs:    Medical: Not on file    Non-medical: Not on file  Tobacco Use  . Smoking status: Never Smoker  . Smokeless tobacco: Never Used  Substance and Sexual Activity  . Alcohol use: Yes  . Drug use: Never  . Sexual activity: Not on file  Lifestyle  . Physical activity:    Days per week: Not on file    Minutes per session: Not on file  . Stress: Not on file  Relationships  . Social connections:    Talks on phone: Not on file    Gets together: Not on file    Attends religious service: Not on file    Active member of club or organization: Not on file    Attends meetings of clubs or organizations: Not on file    Relationship status: Not on file  . Intimate partner violence:    Fear of current or ex partner: Not on file    Emotionally abused: Not on file    Physically abused: Not on file    Forced sexual activity: Not on file  Other Topics Concern  . Not on file  Social History Narrative  . Not on file    Outpatient Medications Prior to  Visit  Medication Sig Dispense Refill  . naproxen (NAPROSYN) 500 MG tablet Take 1 tablet twice daily as needed for pain. (Patient not taking: Reported on 05/30/2018) 15 tablet 0   No facility-administered medications prior to visit.     No Known Allergies  ROS Review of Systems  Constitutional: Negative.   HENT: Negative.   Eyes: Negative.   Respiratory: Negative.   Cardiovascular: Negative.   Gastrointestinal: Negative.   Endocrine: Negative.   Genitourinary: Negative.   Musculoskeletal: Negative.   Skin: Negative.   Allergic/Immunologic: Negative.   Neurological: Positive for dizziness (Occasional) and headaches (occasional ).  Hematological: Negative.   Psychiatric/Behavioral: Negative.       Objective:    Physical Exam  Constitutional: He is oriented to person, place, and time. He  appears well-developed and well-nourished.  HENT:  Head: Normocephalic and atraumatic.  Eyes: Conjunctivae are normal.  Neck: Normal range of motion. Neck supple.  Cardiovascular: Normal rate, regular rhythm, normal heart sounds and intact distal pulses.  Pulmonary/Chest: Effort normal and breath sounds normal.  Abdominal: Soft. Bowel sounds are normal.  Musculoskeletal: Normal range of motion.  Neurological: He is alert and oriented to person, place, and time. He has normal reflexes.  Skin: Skin is warm and dry.  Psychiatric: He has a normal mood and affect. His behavior is normal. Judgment and thought content normal.  Nursing note and vitals reviewed.   BP (!) 188/110   Pulse 86   Temp 98 F (36.7 C) (Oral)   Ht 5\' 10"  (1.778 m)   Wt 296 lb (134.3 kg)   SpO2 92%   BMI 42.47 kg/m  Wt Readings from Last 3 Encounters:  06/20/18 295 lb 13.7 oz (134.2 kg)  06/20/18 296 lb (134.3 kg)  05/30/18 265 lb (120.2 kg)     Health Maintenance Due  Topic Date Due  . Hepatitis C Screening  March 12, 1960  . HIV Screening  05/22/1975  . COLONOSCOPY  05/22/2010    There are no preventive care reminders to display for this patient.  No results found for: TSH Lab Results  Component Value Date   WBC 5.4 05/30/2018   HGB 15.5 05/30/2018   HCT 47.0 05/30/2018   MCV 94.9 05/30/2018   PLT 296 05/30/2018   Lab Results  Component Value Date   NA 139 05/30/2018   K 4.1 05/30/2018   CO2 28 05/30/2018   GLUCOSE 116 (H) 05/30/2018   BUN 13 05/30/2018   CREATININE 1.12 05/30/2018   BILITOT 0.6 05/30/2018   ALKPHOS 51 05/30/2018   AST 26 05/30/2018   ALT 36 05/30/2018   PROT 8.0 05/30/2018   ALBUMIN 4.5 05/30/2018   CALCIUM 9.4 05/30/2018   ANIONGAP 8 05/30/2018   No results found for: CHOL No results found for: HDL No results found for: LDLCALC No results found for: TRIG No results found for: CHOLHDL Lab Results  Component Value Date   HGBA1C 6.1 (A) 06/20/2018   Assessment &  Plan:   1. Hospital discharge follow-up  2. Encounter to establish care  3. Hypertensive urgency Blood pressures are elevated today. Clonidine 0.3 mg given to patient in office and blood pressures remain elevated. We referred him to ED via ambulance at this time. He denies severe headaches, confusion, seizures, double vision, and blurred vision, nausea and vomiting. Patient transported to ED via EMS today.  - cloNIDine (CATAPRES) tablet 0.1 mg - cloNIDine (CATAPRES) tablet 0.1 mg - cloNIDine (CATAPRES) tablet 0.1 mg  4. Chest pain  ECG performed in office today.   5. Hypertension, unspecified type  6. Obesity Body mass index is 42.47 kg/m. Goal BMI  is <30. Encouraged efforts to reduce weight include engaging in physical activity as tolerated with goal of 150 minutes per week. Improve dietary choices and eat a meal regimen consistent with a Mediterranean or DASH diet. Reduce simple carbohydrates. Do not skip meals and eat healthy snacks throughout the day to avoid over-eating at dinner. Set a goal weight loss that is achievable for you.  7. Screening for diabetes mellitus Hgb A1c is within normal range of 6.1 today. He will continue to decrease foods/beverages high in sugars and carbs and follow Heart Healthy or DASH diet. Increase physical activity to at least 30 minutes cardio exercise daily.  - POCT glycosylated hemoglobin (Hb A1C) - POCT urinalysis dipstick   8. Erectile dysfunction Rx for Cialis sent to pharmacy today.   9. Skin rash Rx for Hydrocortisone sent to pharmacy today.   10. Follow up He will follow up with our office after ED evaluation.   Meds ordered this encounter  Medications  . cloNIDine (CATAPRES) tablet 0.1 mg  . cloNIDine (CATAPRES) tablet 0.1 mg  . cloNIDine (CATAPRES) tablet 0.1 mg  . tadalafil (CIALIS) 10 MG tablet    Sig: Take 1 tablet (10 mg total) by mouth daily as needed for erectile dysfunction.    Dispense:  10 tablet    Refill:  0  .  hydrocortisone cream 1 %    Sig: Apply 1 application topically 2 (two) times daily.    Dispense:  30 g    Refill:  3    Orders Placed This Encounter  Procedures  . POCT glycosylated hemoglobin (Hb A1C)  . POCT urinalysis dipstick  . EKG 12-Lead    Referral Orders  No referral(s) requested today    Raliegh IpNatalie Denzell Colasanti,  MSN, FNP-BC [facility] [address]  [place], [place] [id number] [phone]  Problem List Items Addressed This Visit      Other   Blood pressure elevated without history of HTN   Relevant Medications   cloNIDine (CATAPRES) tablet 0.1 mg (Completed)   cloNIDine (CATAPRES) tablet 0.1 mg (Completed)   cloNIDine (CATAPRES) tablet 0.1 mg (Completed)    Other Visit Diagnoses    Hospital discharge follow-up    -  Primary   Encounter to establish care       Hypertensive urgency       Relevant Medications   cloNIDine (CATAPRES) tablet 0.1 mg (Completed)   cloNIDine (CATAPRES) tablet 0.1 mg (Completed)   cloNIDine (CATAPRES) tablet 0.1 mg (Completed)   tadalafil (CIALIS) 10 MG tablet   Other Relevant Orders   EKG 12-Lead   Chest pain, unspecified type       Relevant Orders   EKG 12-Lead   Class 3 severe obesity due to excess calories with serious comorbidity and body mass index (BMI) of 40.0 to 44.9 in adult [facility](HCC)       Screening for diabetes mellitus       Relevant Orders   POCT glycosylated hemoglobin (Hb A1C) (Completed)   POCT urinalysis dipstick (Completed)   Erectile dysfunction, unspecified erectile dysfunction type       Relevant Medications   tadalafil (CIALIS) 10 MG tablet   Skin rash       Relevant Medications   hydrocortisone cream 1 %   Follow up          Meds  ordered this encounter  Medications  . cloNIDine (CATAPRES) tablet 0.1 mg  . cloNIDine (CATAPRES) tablet 0.1 mg  . cloNIDine (CATAPRES) tablet 0.1 mg  . tadalafil (CIALIS) 10 MG tablet    Sig: Take 1 tablet (10 mg total) by mouth daily as  needed for erectile dysfunction.    Dispense:  10 tablet    Refill:  0  . hydrocortisone cream 1 %    Sig: Apply 1 application topically 2 (two) times daily.    Dispense:  30 g    Refill:  3    Follow-up: No follow-ups on file.    Kallie LocksNatalie M Charlisha Market, FNP

## 2018-06-20 NOTE — ED Provider Notes (Signed)
Medical screening examination/treatment/procedure(s) were conducted as a shared visit with non-physician practitioner(s) and myself.  I personally evaluated the patient during the encounter.  EKG Interpretation  Date/Time:  Wednesday June 20 2018 11:58:51 EDT Ventricular Rate:  84 PR Interval:    QRS Duration: 93 QT Interval:  365 QTC Calculation: 432 R Axis:   -40 Text Interpretation:  Sinus rhythm Left axis deviation Abnormal R-wave progression, late transition Minimal ST depression, inferior leads No significant change since last tracing Confirmed by Lacretia Leigh ([id number]) on 06/20/2018 12:5:80 PM  58 year old male presents with asymptomatic hypertension from his doctor's office.  His renal function is within normal limits here.  He had received clonidine by his physician and had improvement in his blood pressure.  Will be discharged home   Lacretia Leigh, MD 06/20/18 1246

## 2018-06-20 NOTE — Discharge Instructions (Addendum)
You have been diagnosed today with asymptomatic hypertension.  At this time there does not appear to be the presence of an emergent medical condition, however there is always the potential for conditions to change. Please read and follow the below instructions.  Please return to the Emergency Department immediately for any new or worsening symptoms. Please be sure to follow up with your Primary Care Provider within one week regarding your visit today; please call their office to schedule an appointment even if you are feeling better for a follow-up visit. You may begin taking the blood pressure medication amlodipine as prescribed to help treat your hypertension.  Call your primary care doctor's office today to discuss your visit here in the ED and new medication.  You will need follow-up within 1 week for blood pressure recheck and medication management.  Get help right away if: You get a very bad headache. You start to feel confused. You feel weak or numb. You feel faint. You get very bad pain in your: Chest. Belly (abdomen). You throw up (vomit) more than once. You have trouble breathing. You are dizzy or have headaches Any new/concerning or worsening symptoms.  Please read the additional information packets attached to your discharge summary.  Do not take your medicine if  develop an itchy rash, swelling in your mouth or lips, or difficulty breathing; call 911 and seek immediate emergency medical attention if this occurs.

## 2018-06-20 NOTE — ED Notes (Signed)
Bed: WA02 Expected date:  Expected time:  Means of arrival:  Comments: EMS/hypertension

## 2018-06-22 ENCOUNTER — Telehealth: Payer: Self-pay

## 2018-06-22 NOTE — Telephone Encounter (Signed)
Patient negative for screening and will be coming in office for appointment

## 2018-06-22 NOTE — Telephone Encounter (Signed)
Patient needs a work for note from 06/20/2018-06/23/2018.

## 2018-06-25 ENCOUNTER — Other Ambulatory Visit: Payer: Self-pay | Admitting: Family Medicine

## 2018-06-25 ENCOUNTER — Encounter: Payer: Self-pay | Admitting: Family Medicine

## 2018-06-25 ENCOUNTER — Ambulatory Visit (INDEPENDENT_AMBULATORY_CARE_PROVIDER_SITE_OTHER): Payer: BC Managed Care – PPO | Admitting: Family Medicine

## 2018-06-25 ENCOUNTER — Other Ambulatory Visit: Payer: Self-pay

## 2018-06-25 VITALS — BP 160/91 | HR 92 | Temp 98.1°F | Ht 70.0 in | Wt 300.2 lb

## 2018-06-25 DIAGNOSIS — E66813 Obesity, class 3: Secondary | ICD-10-CM

## 2018-06-25 DIAGNOSIS — R21 Rash and other nonspecific skin eruption: Secondary | ICD-10-CM

## 2018-06-25 DIAGNOSIS — Z6841 Body Mass Index (BMI) 40.0 and over, adult: Secondary | ICD-10-CM

## 2018-06-25 DIAGNOSIS — I10 Essential (primary) hypertension: Secondary | ICD-10-CM

## 2018-06-25 DIAGNOSIS — N529 Male erectile dysfunction, unspecified: Secondary | ICD-10-CM

## 2018-06-25 DIAGNOSIS — Z09 Encounter for follow-up examination after completed treatment for conditions other than malignant neoplasm: Secondary | ICD-10-CM

## 2018-06-25 DIAGNOSIS — I160 Hypertensive urgency: Secondary | ICD-10-CM | POA: Insufficient documentation

## 2018-06-25 LAB — POCT URINALYSIS DIP (MANUAL ENTRY)
Bilirubin, UA: NEGATIVE
Glucose, UA: NEGATIVE mg/dL
Ketones, POC UA: NEGATIVE mg/dL
Leukocytes, UA: NEGATIVE
Nitrite, UA: NEGATIVE
Protein Ur, POC: NEGATIVE mg/dL
Spec Grav, UA: 1.02 (ref 1.010–1.025)
Urobilinogen, UA: 0.2 E.U./dL
pH, UA: 6 (ref 5.0–8.0)

## 2018-06-25 MED ORDER — CLONIDINE HCL 0.1 MG PO TABS
0.2000 mg | ORAL_TABLET | Freq: Once | ORAL | Status: AC
  Administered 2018-06-25: 0.2 mg via ORAL

## 2018-06-25 MED ORDER — CLONIDINE HCL 0.1 MG PO TABS
0.1000 mg | ORAL_TABLET | Freq: Once | ORAL | Status: AC
  Administered 2018-06-25: 0.1 mg via ORAL

## 2018-06-25 MED ORDER — BENAZEPRIL-HYDROCHLOROTHIAZIDE 20-12.5 MG PO TABS: 1.0000 | ORAL_TABLET | Freq: Every day | ORAL | 3 refills | Status: DC

## 2018-06-25 NOTE — Telephone Encounter (Signed)
Patient notified

## 2018-06-25 NOTE — Progress Notes (Signed)
[facility]   Hospital Follow Up  Subjective:  Patient ID: William Ellison, male    DOB: 12/17/1960  Age: 58 y.o. MRN: [id number]  CC:  Chief Complaint  Patient presents with  . Hospitalization Follow-up    HPI William Ellison is a 58 year old male who presents for Hospital Follow Up for Elevated Blood Pressures.  Past Medical History:  Diagnosis Date  . Erectile dysfunction   . Hypertension   . Skin rash    Current Status: Since his last office visit, he is doing well with no complaints.        He denies fevers, chills, fatigue, recent infections, weight loss, and night sweats. He has not had any headaches, visual changes, dizziness, and falls. No chest pain, heart palpitations, cough and shortness of breath reported. No reports of GI problems such as nausea, vomiting, diarrhea, and constipation. He has no reports of blood in stools, dysuria and hematuria. No depression or anxiety, and denies suicidal ideations, homicidal ideations, or auditory hallucinations. He denies pain today.      History reviewed. No pertinent surgical history.  Family History  Problem Relation Age of Onset  . Hypertension Mother     Social History   Socioeconomic History  . Marital status: Significant Other    Spouse name: Not on file  . Number of children: Not on file  . Years of education: Not on file  . Highest education level: Not on file  Occupational History  . Not on file  Social Needs  . Financial resource strain: Not on file  . Food insecurity    Worry: Not on file    Inability: Not on file  . Transportation needs    Medical: Not on file    Non-medical: Not on file  Tobacco Use  . Smoking status: Never Smoker  . Smokeless tobacco: Never Used  Substance and Sexual Activity  . Alcohol use: Yes  . Drug use: Never  . Sexual activity: Not on file  Lifestyle  . Physical activity    Days per week: Not on file    Minutes  per session: Not on file  . Stress: Not on file  Relationships  . Social Musicianconnections    Talks on phone: Not on file    Gets together: Not on file    Attends religious service: Not on file    Active member of club or organization: Not on file    Attends meetings of clubs or organizations: Not on file    Relationship status: Not on file  . Intimate partner violence    Fear of current or ex partner: Not on file    Emotionally abused: Not on file    Physically abused: Not on file    Forced sexual activity: Not on file  Other Topics Concern  . Not on file  Social History Narrative  . Not on file    Outpatient Medications Prior to Visit  Medication Sig Dispense Refill  . amLODipine (NORVASC) 5 MG tablet Take 1 tablet (5 mg total) by mouth daily. 30 tablet 0  . hydrocortisone cream 1 % Apply 1 application topically 2 (two) times daily. 30 g 3  . tadalafil (CIALIS) 10 MG tablet Take 1 tablet (10 mg total) by mouth daily as needed for erectile dysfunction. 10 tablet 0  . naproxen (NAPROSYN) 500 MG tablet Take 1 tablet twice daily as needed for pain. (Patient not taking: Reported on 06/25/2018)  15 tablet 0   No facility-administered medications prior to visit.     No Known Allergies  ROS Review of Systems  Constitutional: Negative.   HENT: Negative.   Eyes: Negative.   Respiratory: Negative.   Cardiovascular: Negative.   Gastrointestinal: Negative.   Endocrine: Negative.   Genitourinary: Negative.   Musculoskeletal: Negative.   Skin: Negative.   Allergic/Immunologic: Negative.   Neurological: Positive for dizziness (occasional) and headaches (Occasional).  Hematological: Negative.   Psychiatric/Behavioral: Negative.       Objective:    Physical Exam  Constitutional: He is oriented to person, place, and time. He appears well-developed and well-nourished.  HENT:  Head: Normocephalic and atraumatic.  Eyes: Conjunctivae are normal.  Neck: Neck supple.  Cardiovascular:  Normal rate, regular rhythm, normal heart sounds and intact distal pulses.  Pulmonary/Chest: Effort normal and breath sounds normal.  Abdominal: Soft. Bowel sounds are normal.  Musculoskeletal: Normal range of motion.  Neurological: He is alert and oriented to person, place, and time. He has normal reflexes.  Skin: Skin is warm and dry.  Psychiatric: He has a normal mood and affect. His behavior is normal. Judgment and thought content normal.  Nursing note and vitals reviewed.   BP (!) 176/100   Pulse 92   Temp 98.1 F (36.7 C) (Oral)   Ht 5\' 10"  (1.778 m)   Wt (!) 300 lb 3.2 oz (136.2 kg)   SpO2 98%   BMI 43.07 kg/m  Wt Readings from Last 3 Encounters:  06/25/18 (!) 300 lb 3.2 oz (136.2 kg)  06/20/18 295 lb 13.7 oz (134.2 kg)  06/20/18 296 lb (134.3 kg)     Health Maintenance Due  Topic Date Due  . Hepatitis C Screening  August 12, 1960  . HIV Screening  05/22/1975  . COLONOSCOPY  05/22/2010    There are no preventive care reminders to display for this patient.  No results found for: TSH Lab Results  Component Value Date   WBC 5.4 05/30/2018   HGB 15.5 05/30/2018   HCT 47.0 05/30/2018   MCV 94.9 05/30/2018   PLT 296 05/30/2018   Lab Results  Component Value Date   NA 138 06/20/2018   K 4.2 06/20/2018   CO2 26 06/20/2018   GLUCOSE 113 (H) 06/20/2018   BUN 21 (H) 06/20/2018   CREATININE 1.23 06/20/2018   BILITOT 0.6 05/30/2018   ALKPHOS 51 05/30/2018   AST 26 05/30/2018   ALT 36 05/30/2018   PROT 8.0 05/30/2018   ALBUMIN 4.5 05/30/2018   CALCIUM 8.7 (L) 06/20/2018   ANIONGAP 8 06/20/2018   No results found for: CHOL No results found for: HDL No results found for: LDLCALC No results found for: TRIG No results found for: CHOLHDL Lab Results  Component Value Date   HGBA1C 6.1 (A) 06/20/2018      Assessment & Plan:   1. Hospital discharge follow-up  2. Hypertensive urgency Blood pressures are elevated today. Clonidine 0.3 mg given to patient in  office and blood pressures remain elevated. We referred him to ED via ambulance at this time. He denies severe headaches, confusion, seizures, double vision, and blurred vision, nausea and vomiting. He will report to ED if he experiences these symptoms. Patient verbalized understanding.   - cloNIDine (CATAPRES) tablet 0.2 mg - cloNIDine (CATAPRES) tablet 0.1 mg  3. Hypertension, unspecified type  4. Class 3 severe obesity due to excess calories with serious comorbidity and body mass index (BMI) of 40.0 to 44.9 in adult [facility](HCC) Body  mass index is 43.07 kg/m. Goal BMI  is <30. Encouraged efforts to reduce weight include engaging in physical activity as tolerated with goal of 150 minutes per week. Improve dietary choices and eat a meal regimen consistent with a Mediterranean or DASH diet. Reduce simple carbohydrates. Do not skip meals and eat healthy snacks throughout the day to avoid over-eating at dinner. Set a goal weight loss that is achievable for you.  5. Erectile dysfunction, unspecified erectile dysfunction type  6. Skin rash Stable.   7. Follow up He will follow up weekly X 2 weeks for blood pressure checks only. He will follow up in 1 month for office visit.  - POCT urinalysis dipstick  Meds ordered this encounter  Medications  . cloNIDine (CATAPRES) tablet 0.2 mg  . cloNIDine (CATAPRES) tablet 0.1 mg    Orders Placed This Encounter  Procedures  . POCT urinalysis dipstick    Referral Orders  No referral(s) requested today    Kathe Becton,  MSN, FNP-BC Patient [place], [place] [phone]   Problem List Items Addressed This Visit      Musculoskeletal and Integument   Skin rash     Other   Class 3 severe obesity due to excess calories with serious comorbidity and body mass index (BMI) of 40.0 to 44.9 in adult [facility])   Erectile dysfunction    Other Visit Diagnoses    Hospital discharge follow-up     -  Primary   Hypertensive urgency       Relevant Medications   cloNIDine (CATAPRES) tablet 0.2 mg (Completed)   cloNIDine (CATAPRES) tablet 0.1 mg (Completed)   Hypertension, unspecified type       Relevant Medications   cloNIDine (CATAPRES) tablet 0.2 mg (Completed)   cloNIDine (CATAPRES) tablet 0.1 mg (Completed)   Follow up       Relevant Orders   POCT urinalysis dipstick (Completed)      Meds ordered this encounter  Medications  . cloNIDine (CATAPRES) tablet 0.2 mg  . cloNIDine (CATAPRES) tablet 0.1 mg    Follow-up: No follow-ups on file.    Azzie Glatter, FNP

## 2018-07-06 ENCOUNTER — Ambulatory Visit: Payer: BC Managed Care – PPO | Admitting: Family Medicine

## 2018-07-09 ENCOUNTER — Ambulatory Visit (INDEPENDENT_AMBULATORY_CARE_PROVIDER_SITE_OTHER): Payer: BC Managed Care – PPO | Admitting: Family Medicine

## 2018-07-09 ENCOUNTER — Other Ambulatory Visit: Payer: Self-pay

## 2018-07-09 VITALS — BP 138/82 | HR 88 | Ht 70.0 in | Wt 300.0 lb

## 2018-07-09 DIAGNOSIS — Z0130 Encounter for examination of blood pressure without abnormal findings: Secondary | ICD-10-CM

## 2018-07-09 NOTE — Progress Notes (Signed)
Patient advise to continue his medication and keep follow up as schedule.

## 2018-07-11 ENCOUNTER — Other Ambulatory Visit: Payer: Self-pay | Admitting: Family Medicine

## 2018-07-11 ENCOUNTER — Encounter: Payer: Self-pay | Admitting: Family Medicine

## 2018-07-16 ENCOUNTER — Telehealth: Payer: Self-pay

## 2018-07-16 NOTE — Telephone Encounter (Signed)
Note faxed to [place] at patient work place. [phone]

## 2018-07-23 ENCOUNTER — Other Ambulatory Visit: Payer: Self-pay

## 2018-07-23 ENCOUNTER — Ambulatory Visit: Payer: BC Managed Care – PPO | Admitting: Family Medicine

## 2018-07-23 NOTE — Telephone Encounter (Signed)
Patient advise that his medication has refill and to contact the pharmacy

## 2018-07-30 ENCOUNTER — Ambulatory Visit: Payer: BC Managed Care – PPO | Admitting: Family Medicine

## 2019-05-11 DIAGNOSIS — I639 Cerebral infarction, unspecified: Secondary | ICD-10-CM

## 2019-05-11 HISTORY — DX: Cerebral infarction, unspecified: I63.9

## 2019-06-07 ENCOUNTER — Inpatient Hospital Stay (HOSPITAL_COMMUNITY)
Admission: EM | Admit: 2019-06-07 | Discharge: 2019-06-12 | DRG: 041 | Disposition: A | Discharge status: Rehabilitation Facility | Payer: BC Managed Care – PPO | Attending: Neurology | Admitting: Neurology

## 2019-06-07 ENCOUNTER — Other Ambulatory Visit: Payer: Self-pay

## 2019-06-07 ENCOUNTER — Emergency Department (HOSPITAL_COMMUNITY): Payer: BC Managed Care – PPO

## 2019-06-07 ENCOUNTER — Inpatient Hospital Stay (HOSPITAL_COMMUNITY): Payer: BC Managed Care – PPO

## 2019-06-07 DIAGNOSIS — I160 Hypertensive urgency: Secondary | ICD-10-CM | POA: Diagnosis present

## 2019-06-07 DIAGNOSIS — E785 Hyperlipidemia, unspecified: Secondary | ICD-10-CM | POA: Diagnosis present

## 2019-06-07 DIAGNOSIS — R0989 Other specified symptoms and signs involving the circulatory and respiratory systems: Secondary | ICD-10-CM | POA: Diagnosis not present

## 2019-06-07 DIAGNOSIS — I63312 Cerebral infarction due to thrombosis of left middle cerebral artery: Secondary | ICD-10-CM | POA: Diagnosis not present

## 2019-06-07 DIAGNOSIS — G479 Sleep disorder, unspecified: Secondary | ICD-10-CM | POA: Diagnosis not present

## 2019-06-07 DIAGNOSIS — R29810 Facial weakness: Secondary | ICD-10-CM | POA: Diagnosis present

## 2019-06-07 DIAGNOSIS — G8191 Hemiplegia, unspecified affecting right dominant side: Secondary | ICD-10-CM | POA: Diagnosis present

## 2019-06-07 DIAGNOSIS — I69320 Aphasia following cerebral infarction: Secondary | ICD-10-CM | POA: Diagnosis not present

## 2019-06-07 DIAGNOSIS — I10 Essential (primary) hypertension: Secondary | ICD-10-CM | POA: Diagnosis not present

## 2019-06-07 DIAGNOSIS — I63412 Cerebral infarction due to embolism of left middle cerebral artery: Secondary | ICD-10-CM | POA: Diagnosis not present

## 2019-06-07 DIAGNOSIS — R4701 Aphasia: Secondary | ICD-10-CM | POA: Diagnosis not present

## 2019-06-07 DIAGNOSIS — Z6841 Body Mass Index (BMI) 40.0 and over, adult: Secondary | ICD-10-CM | POA: Diagnosis not present

## 2019-06-07 DIAGNOSIS — K219 Gastro-esophageal reflux disease without esophagitis: Secondary | ICD-10-CM | POA: Diagnosis not present

## 2019-06-07 DIAGNOSIS — R7303 Prediabetes: Secondary | ICD-10-CM | POA: Diagnosis present

## 2019-06-07 DIAGNOSIS — Z79899 Other long term (current) drug therapy: Secondary | ICD-10-CM

## 2019-06-07 DIAGNOSIS — E876 Hypokalemia: Secondary | ICD-10-CM | POA: Diagnosis not present

## 2019-06-07 DIAGNOSIS — R414 Neurologic neglect syndrome: Secondary | ICD-10-CM | POA: Diagnosis not present

## 2019-06-07 DIAGNOSIS — I6389 Other cerebral infarction: Secondary | ICD-10-CM

## 2019-06-07 DIAGNOSIS — Z713 Dietary counseling and surveillance: Secondary | ICD-10-CM | POA: Diagnosis not present

## 2019-06-07 DIAGNOSIS — R404 Transient alteration of awareness: Secondary | ICD-10-CM | POA: Diagnosis not present

## 2019-06-07 DIAGNOSIS — G4700 Insomnia, unspecified: Secondary | ICD-10-CM | POA: Diagnosis not present

## 2019-06-07 DIAGNOSIS — N529 Male erectile dysfunction, unspecified: Secondary | ICD-10-CM | POA: Diagnosis not present

## 2019-06-07 DIAGNOSIS — Z7289 Other problems related to lifestyle: Secondary | ICD-10-CM | POA: Diagnosis not present

## 2019-06-07 DIAGNOSIS — R29708 NIHSS score 8: Secondary | ICD-10-CM | POA: Diagnosis not present

## 2019-06-07 DIAGNOSIS — Z20822 Contact with and (suspected) exposure to covid-19: Secondary | ICD-10-CM | POA: Diagnosis not present

## 2019-06-07 DIAGNOSIS — N179 Acute kidney failure, unspecified: Secondary | ICD-10-CM | POA: Diagnosis not present

## 2019-06-07 DIAGNOSIS — I639 Cerebral infarction, unspecified: Secondary | ICD-10-CM

## 2019-06-07 DIAGNOSIS — Z8249 Family history of ischemic heart disease and other diseases of the circulatory system: Secondary | ICD-10-CM | POA: Diagnosis not present

## 2019-06-07 DIAGNOSIS — R410 Disorientation, unspecified: Secondary | ICD-10-CM | POA: Diagnosis not present

## 2019-06-07 DIAGNOSIS — I69351 Hemiplegia and hemiparesis following cerebral infarction affecting right dominant side: Secondary | ICD-10-CM | POA: Diagnosis not present

## 2019-06-07 DIAGNOSIS — I63512 Cerebral infarction due to unspecified occlusion or stenosis of left middle cerebral artery: Secondary | ICD-10-CM | POA: Diagnosis not present

## 2019-06-07 DIAGNOSIS — S0990XA Unspecified injury of head, initial encounter: Secondary | ICD-10-CM | POA: Diagnosis not present

## 2019-06-07 LAB — ECHOCARDIOGRAM COMPLETE
Height: 70 in
Weight: 4720 oz

## 2019-06-07 LAB — COMPREHENSIVE METABOLIC PANEL
ALT: 24 U/L (ref 0–44)
AST: 18 U/L (ref 15–41)
Albumin: 3.9 g/dL (ref 3.5–5.0)
Alkaline Phosphatase: 46 U/L (ref 38–126)
Anion gap: 10 (ref 5–15)
BUN: 13 mg/dL (ref 6–20)
CO2: 26 mmol/L (ref 22–32)
Calcium: 9.2 mg/dL (ref 8.9–10.3)
Chloride: 103 mmol/L (ref 98–111)
Creatinine, Ser: 1.28 mg/dL — ABNORMAL HIGH (ref 0.61–1.24)
GFR calc Af Amer: >60 mL/min (ref >60)
GFR calc non Af Amer: >60 mL/min (ref >60)
Glucose, Bld: 106 mg/dL — ABNORMAL HIGH (ref 70–99)
Potassium: 4 mmol/L (ref 3.5–5.1)
Sodium: 139 mmol/L (ref 135–145)
Total Bilirubin: 0.8 mg/dL (ref 0.3–1.2)
Total Protein: 7.2 g/dL (ref 6.5–8.1)

## 2019-06-07 LAB — CBC WITH DIFFERENTIAL/PLATELET
Abs Immature Granulocytes: 0.01 10*3/uL (ref 0.00–0.07)
Basophils Absolute: 0 10*3/uL (ref 0.0–0.1)
Basophils Relative: 0 %
Eosinophils Absolute: 0 10*3/uL (ref 0.0–0.5)
Eosinophils Relative: 1 %
HCT: 45.6 % (ref 39.0–52.0)
Hemoglobin: 15.4 g/dL (ref 13.0–17.0)
Immature Granulocytes: 0 %
Lymphocytes Relative: 40 %
Lymphs Abs: 2.6 10*3/uL (ref 0.7–4.0)
MCH: 31.6 pg (ref 26.0–34.0)
MCHC: 33.8 g/dL (ref 30.0–36.0)
MCV: 93.4 fL (ref 80.0–100.0)
Monocytes Absolute: 0.5 10*3/uL (ref 0.1–1.0)
Monocytes Relative: 8 %
Neutro Abs: 3.3 10*3/uL (ref 1.7–7.7)
Neutrophils Relative %: 51 %
Platelets: 268 10*3/uL (ref 150–400)
RBC: 4.88 MIL/uL (ref 4.22–5.81)
RDW: 12.8 % (ref 11.5–15.5)
WBC: 6.5 10*3/uL (ref 4.0–10.5)
nRBC: 0 % (ref 0.0–0.2)

## 2019-06-07 LAB — URINALYSIS, COMPLETE (UACMP) WITH MICROSCOPIC
Bacteria, UA: NONE SEEN
Bilirubin Urine: NEGATIVE
Glucose, UA: NEGATIVE mg/dL
Ketones, ur: NEGATIVE mg/dL
Leukocytes,Ua: NEGATIVE
Nitrite: NEGATIVE
Protein, ur: NEGATIVE mg/dL
Specific Gravity, Urine: 1.028 (ref 1.005–1.030)
pH: 5 (ref 5.0–8.0)

## 2019-06-07 LAB — LIPID PANEL
Cholesterol: 198 mg/dL (ref 0–200)
HDL: 37 mg/dL — ABNORMAL LOW (ref >40)
LDL Cholesterol: 148 mg/dL — ABNORMAL HIGH (ref 0–99)
Total CHOL/HDL Ratio: 5.4 RATIO
Triglycerides: 67 mg/dL (ref <150)
VLDL: 13 mg/dL (ref 0–40)

## 2019-06-07 LAB — CBG MONITORING, ED: Glucose-Capillary: 113 mg/dL — ABNORMAL HIGH (ref 70–99)

## 2019-06-07 LAB — AMMONIA: Ammonia: 56 umol/L — ABNORMAL HIGH (ref 9–35)

## 2019-06-07 LAB — HEMOGLOBIN A1C
Hgb A1c MFr Bld: 6.2 % — ABNORMAL HIGH (ref 4.8–5.6)
Mean Plasma Glucose: 131.24 mg/dL

## 2019-06-07 LAB — HIV ANTIBODY (ROUTINE TESTING W REFLEX): HIV Screen 4th Generation wRfx: NONREACTIVE

## 2019-06-07 LAB — MRSA PCR SCREENING: MRSA by PCR: NEGATIVE

## 2019-06-07 MED ORDER — ATORVASTATIN CALCIUM 80 MG PO TABS
80.0000 mg | ORAL_TABLET | Freq: Every day | ORAL | Status: DC
  Administered 2019-06-07 – 2019-06-12 (×6): 80 mg via ORAL
  Filled 2019-06-07 (×6): qty 1

## 2019-06-07 MED ORDER — ACETAMINOPHEN 325 MG PO TABS: 650.0000 mg | ORAL_TABLET | ORAL | Status: DC | PRN

## 2019-06-07 MED ORDER — IOHEXOL 350 MG/ML SOLN
100.0000 mL | Freq: Once | INTRAVENOUS | Status: AC | PRN
  Administered 2019-06-07: 100 mL via INTRAVENOUS

## 2019-06-07 MED ORDER — SODIUM CHLORIDE 0.9 % IV BOLUS: 500.0000 mL | Freq: Once | INTRAVENOUS | Status: DC

## 2019-06-07 MED ORDER — ENOXAPARIN SODIUM 40 MG/0.4ML ~~LOC~~ SOLN
40.0000 mg | SUBCUTANEOUS | Status: DC
  Administered 2019-06-07: 40 mg via SUBCUTANEOUS
  Filled 2019-06-07: qty 0.4

## 2019-06-07 MED ORDER — ASPIRIN 300 MG RE SUPP: 300.0000 mg | Freq: Every day | RECTAL | Status: DC

## 2019-06-07 MED ORDER — STROKE: EARLY STAGES OF RECOVERY BOOK
Freq: Once | Status: DC
  Filled 2019-06-07: qty 1

## 2019-06-07 MED ORDER — SODIUM CHLORIDE 0.9 % IV SOLN: INTRAVENOUS | Status: DC

## 2019-06-07 MED ORDER — ENOXAPARIN SODIUM 80 MG/0.8ML ~~LOC~~ SOLN
0.5000 mg/kg | SUBCUTANEOUS | Status: DC
  Administered 2019-06-08 – 2019-06-12 (×5): 65 mg via SUBCUTANEOUS
  Filled 2019-06-07 (×5): qty 0.8

## 2019-06-07 MED ORDER — ACETAMINOPHEN 650 MG RE SUPP: 650.0000 mg | RECTAL | Status: DC | PRN

## 2019-06-07 MED ORDER — PERFLUTREN LIPID MICROSPHERE
1.0000 mL | INTRAVENOUS | Status: AC | PRN
  Administered 2019-06-07: 3 mL via INTRAVENOUS
  Filled 2019-06-07: qty 10

## 2019-06-07 MED ORDER — ASPIRIN EC 81 MG PO TBEC
81.0000 mg | DELAYED_RELEASE_TABLET | Freq: Every day | ORAL | Status: DC
  Administered 2019-06-07 – 2019-06-11 (×5): 81 mg via ORAL
  Filled 2019-06-07 (×6): qty 1

## 2019-06-07 MED ORDER — ASPIRIN 325 MG PO TABS
325.0000 mg | ORAL_TABLET | Freq: Every day | ORAL | Status: DC
  Filled 2019-06-07: qty 1

## 2019-06-07 MED ORDER — CLOPIDOGREL BISULFATE 75 MG PO TABS
75.0000 mg | ORAL_TABLET | Freq: Every day | ORAL | Status: DC
  Administered 2019-06-07 – 2019-06-12 (×6): 75 mg via ORAL
  Filled 2019-06-07 (×5): qty 1

## 2019-06-07 MED ORDER — ACETAMINOPHEN 160 MG/5ML PO SOLN: 650.0000 mg | ORAL | Status: DC | PRN
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
## 2019-06-07 NOTE — ED Notes (Signed)
William Ellison: [phone]/ [phone]

## 2019-06-07 NOTE — Evaluation (Signed)
Physical Therapy Evaluation Patient Details Name: William Ellison MRN: [id number] DOB: 22-Sep-1960 Today's Date: 06/07/2019   History of Present Illness  59 y.o. male who was last in his normal state of health prior to bed on 05/26.  This morning, he was still doing okay but had some confusion, however he passed out around 2 PM and then subsequently was much worse having trouble with word finding. 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 Pt was outside the window for TPA. PMH includes HTN and erectile dysfunction.  Clinical Impression  Pt presents to PT with deficits in functional mobility, gait, balance, cognition, and communication. PT with significant expressive and receptive aphasias, often repeating what therapist says and only able to answer questions in one to 2 word phrases. Pt only able to provide words that were previously spoken by PT. Pt with impaired ability to follow both verbal and tactile cues for motor commands during session. Pt demonstrates increased sway during mobility, requiring minA to maintain balance throughout OOB activity. Pt will benefit from acute PT POC to improve balance in an effort to restore independence. PT currently recommends CIR as the patient reports being independent prior to admission and demonstrates the potential to return to this level.    Follow Up Recommendations CIR;Supervision/Assistance - 24 hour    Equipment Recommendations  Rolling walker with 5" wheels;3in1 (PT)    Recommendations for Other Services       Precautions / Restrictions Precautions Precautions: Fall Restrictions Weight Bearing Restrictions: No      Mobility  Bed Mobility Overal bed mobility: Needs Assistance Bed Mobility: Supine to Sit     Supine to sit: Supervision        Transfers Overall transfer level: Needs assistance Equipment used: None Transfers: Sit to/from Stand Sit to Stand:  Min guard            Ambulation/Gait Ambulation/Gait assistance: Min assist Gait Distance (Feet): 20 Feet Assistive device: None Gait Pattern/deviations: Step-to pattern;Wide base of support Gait velocity: reduced Gait velocity interpretation: <1.8 ft/sec, indicate of risk for recurrent falls General Gait Details: pt with short step to gait, increased lateral sway  Stairs            Wheelchair Mobility    Modified Rankin (Stroke Patients Only) Modified Rankin (Stroke Patients Only) Pre-Morbid Rankin Score: No symptoms Modified Rankin: Moderately severe disability     Balance Overall balance assessment: Needs assistance Sitting-balance support: Single extremity supported;Feet supported Sitting balance-Leahy Scale: Fair Sitting balance - Comments: close supervision at the edge of bed   Standing balance support: No upper extremity supported;During functional activity Standing balance-Leahy Scale: Poor Standing balance comment: minA for hand washing at sink                             Pertinent Vitals/Pain Pain Assessment: Faces Faces Pain Scale: No hurt    Home Living Family/patient expects to be discharged to:: Private residence Living Arrangements: Spouse/significant other Available Help at Discharge: Family;Available PRN/intermittently Type of Home: Apartment Home Access: Level entry     Home Layout: One level Home Equipment: None Additional Comments: pt is unreliable historian due to significant expressive and receptive aphasias    Prior Function Level of Independence: Independent         Comments: 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   Dominant Hand: Right  Extremity/Trunk Assessment   Upper Extremity Assessment Upper Extremity Assessment: Difficult to assess due to impaired cognition(strength at least 4/5 bilaterally)    Lower Extremity Assessment Lower Extremity Assessment: Difficult to assess due to  impaired cognition(strength at least 4/5 bilaterally)    Cervical / Trunk Assessment Cervical / Trunk Assessment: Normal  Communication   Communication: Receptive difficulties;Expressive difficulties  Cognition Arousal/Alertness: Awake/alert Behavior During Therapy: WFL for tasks assessed/performed Overall Cognitive Status: Impaired/Different from baseline Area of Impairment: Orientation;Attention;Memory;Following commands;Safety/judgement;Awareness;Problem solving                 Orientation Level: Disoriented to;Person;Place;Time;Situation Current Attention Level: Focused Memory: Decreased recall of precautions;Decreased short-term memory Following Commands: Follows one step commands inconsistently Safety/Judgement: Decreased awareness of safety;Decreased awareness of deficits Awareness: Intellectual Problem Solving: Slow processing;Requires verbal cues;Requires tactile cues;Difficulty sequencing        General Comments General comments (skin integrity, edema, etc.): VSS on RA, pre-mobility BP 160/111. Post-mobility BP 149/99    Exercises     Assessment/Plan    PT Assessment Patient needs continued PT services  PT Problem List Decreased activity tolerance;Decreased balance;Decreased mobility;Decreased coordination;Decreased cognition;Decreased knowledge of use of DME;Decreased safety awareness;Decreased knowledge of precautions       PT Treatment Interventions DME instruction;Gait training;Stair training;Functional mobility training;Therapeutic activities;Therapeutic exercise;Balance training;Neuromuscular re-education;Cognitive remediation;Patient/family education    PT Goals (Current goals can be found in the Care Plan section)  Acute Rehab PT Goals Patient Stated Goal: Pt unable to state, PT goal to reduce falls risk and restore independent mobility PT Goal Formulation: With patient Time For Goal Achievement: 06/21/19 Potential to Achieve Goals: Good Additional  Goals Additional Goal #1: Pt will score 42/56 or greater on BERG balance test to indicate a low falls risk.    Frequency Min 4X/week   Barriers to discharge        Co-evaluation               AM-PAC PT "6 Clicks" Mobility  Outcome Measure Help needed turning from your back to your side while in a flat bed without using bedrails?: None Help needed moving from lying on your back to sitting on the side of a flat bed without using bedrails?: None Help needed moving to and from a bed to a chair (including a wheelchair)?: A Little Help needed standing up from a chair using your arms (e.g., wheelchair or bedside chair)?: A Little Help needed to walk in hospital room?: A Lot Help needed climbing 3-5 steps with a railing? : Total 6 Click Score: 17    End of Session   Activity Tolerance: Patient tolerated treatment well Patient left: in chair;with call bell/phone within reach;with nursing/sitter in room;with chair alarm set Nurse Communication: Mobility status PT Visit Diagnosis: Unsteadiness on feet (R26.81);Other abnormalities of gait and mobility (R26.89);Other symptoms and signs involving the nervous system (J94.174)    Time: 0814-4818 PT Time Calculation (min) (ACUTE ONLY): 21 min   Charges:   PT Evaluation $PT Eval Moderate Complexity: 1 Mod          Arlyss Gandy, PT, DPT Acute Rehabilitation Pager: [phone]   Arlyss Gandy 06/07/2019, 8:51 AM

## 2019-06-07 NOTE — Progress Notes (Signed)
  Echocardiogram 2D Echocardiogram has been performed.  Gerda Diss 06/07/2019, 10:16 AM

## 2019-06-07 NOTE — Progress Notes (Signed)
Woke Pt from  sleep for assessment, he was unable to answer any of the orientation questions including his name, tried multiple times. Notified MD on call

## 2019-06-07 NOTE — Progress Notes (Signed)
Rehab Admissions Coordinator Note:  Per PT and OT recommendation, this patient was screened by Cheri Rous for appropriateness for an Inpatient Acute Rehab Consult.  At this time, we are recommending an Inpatient Rehab consult. AC will contact MD to request order.   Cheri Rous 06/07/2019, 1:11 PM  I can be reached at [phone].

## 2019-06-07 NOTE — Evaluation (Signed)
Occupational Therapy Evaluation Patient Details Name: William Ellison MRN: [id number] DOB: [birth year] Today's Date: 06/07/2019    History of Present Illness 59 y.o. male who was last in his normal state of health prior to bed on 05/26.  This morning, he was still doing okay but had some confusion, however he passed out around 2 PM and then subsequently was much worse having trouble with word finding. 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 Pt was outside the window for TPA. PMH includes HTN and erectile dysfunction.   Clinical Impression   PT admitted with LMCA. Pt currently with functional limitiations due to the deficits listed below (see OT problem list). Pt currently with global aphasia but able to make correct choice with two options. Pt able to sing abcs with 2 attempts. Pt required increased (A) for basic adl task of washing hands in static standing  Pt will benefit from skilled OT to increase their independence and safety with adls and balance to allow discharge CIR.     Follow Up Recommendations  CIR    Equipment Recommendations  None recommended by OT    Recommendations for Other Services Rehab consult     Precautions / Restrictions Precautions Precautions: Fall Restrictions Weight Bearing Restrictions: No      Mobility Bed Mobility Overal bed mobility: Needs Assistance Bed Mobility: Supine to Sit     Supine to sit: Supervision     General bed mobility comments: exiting L side  Transfers Overall transfer level: Needs assistance Equipment used: None Transfers: Sit to/from Stand Sit to Stand: Min guard              Balance Overall balance assessment: Needs assistance Sitting-balance support: Single extremity supported;Feet supported Sitting balance-Leahy Scale: Fair Sitting balance - Comments: close supervision at the edge of bed   Standing balance support: No upper  extremity supported;During functional activity Standing balance-Leahy Scale: Poor Standing balance comment: minA for hand washing at sink                           ADL either performed or assessed with clinical judgement   ADL Overall ADL's : Needs assistance/impaired Eating/Feeding: Minimal assistance;Sitting   Grooming: Wash/dry hands;Minimal assistance;Sitting Grooming Details (indicate cue type and reason): pt needed max cues to retrieve soap wash hands and then paper towels Upper Body Bathing: Moderate assistance   Lower Body Bathing: Moderate assistance   Upper Body Dressing : Moderate assistance   Lower Body Dressing: Moderate assistance   Toilet Transfer: Minimal assistance Toilet Transfer Details (indicate cue type and reason): pt states "sit' when given choice of stand vs sit and pt standing to void bladder. pt sequenced without need for (A) from therapist. pt needed cues to get closer to commode to decr spillage and increase aim Toileting- Clothing Manipulation and Hygiene: Minimal assistance       Functional mobility during ADLs: Minimal assistance General ADL Comments: pt attempting to exit the bed on arrival and pointing to urinal on floor across room. Pt (A) to bathroom this session and pt then settled into chair     Vision         Perception     Praxis      Pertinent Vitals/Pain Pain Assessment: No/denies pain Faces Pain Scale: No hurt     Hand Dominance Right   Extremity/Trunk Assessment Upper Extremity Assessment Upper Extremity Assessment: Overall  WFL for tasks assessed   Lower Extremity Assessment Lower Extremity Assessment: Defer to PT evaluation   Cervical / Trunk Assessment Cervical / Trunk Assessment: Normal   Communication Communication Communication: Receptive difficulties;Expressive difficulties   Cognition Arousal/Alertness: Awake/alert Behavior During Therapy: WFL for tasks assessed/performed Overall Cognitive Status:  Impaired/Different from baseline Area of Impairment: Orientation;Attention;Memory;Following commands;Safety/judgement;Awareness;Problem solving                 Orientation Level: Disoriented to;Place;Time;Situation Current Attention Level: Sustained Memory: Decreased recall of precautions;Decreased short-term memory Following Commands: Follows one step commands inconsistently Safety/Judgement: Decreased awareness of safety;Decreased awareness of deficits Awareness: Intellectual Problem Solving: Slow processing;Requires verbal cues;Requires tactile cues;Difficulty sequencing General Comments: pt when given two options able to correctly pick hospital May 2021. pt perseverating at times repeating the same information. OT repeating an incorrect answer back to patient and pt makes a face to indicate incorrect. pt laughs and shakes head with realization of wrong answer.    General Comments  VSS- Pt required x2 attempts to sing ABCS. pt signing to "Q" and stopped completely. OT tapping hand a the vocalization of each letter. pt needed x3 pauses but able to finish the song and even sand "now i know my abcs arent you proud of me"    Exercises     Shoulder Instructions      Home Living Family/patient expects to be discharged to:: Private residence Living Arrangements: Spouse/significant other Available Help at Discharge: Family;Available PRN/intermittently Type of Home: Apartment Home Access: Level entry     Home Layout: One level     Bathroom Shower/Tub: Chief Strategy Officer: Standard     Home Equipment: None   Additional Comments: pt is unreliable historian due to significant expressive and receptive aphasias      Prior Functioning/Environment Level of Independence: Independent        Comments: 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 pt agreeable to chair at this time OT Goal Formulation: Patient unable to participate in goal setting Time For Goal Achievement: 06/21/19 Potential to Achieve Goals: Good  OT Frequency: Min 3X/week   Barriers to D/C:            Co-evaluation              AM-PAC OT "6 Clicks" Daily Activity     Outcome Measure Help from another person eating meals?: A Little Help from another person taking care of personal grooming?: A Little Help from another person toileting, which includes using toliet, bedpan, or urinal?: A Lot Help from another person bathing (including washing, rinsing, drying)?: A Lot Help from another person to put on and taking off regular upper body clothing?: A Little Help from another person to put on and taking off regular lower body clothing?: A Lot 6 Click Score: 15   End of Session Nurse Communication: Mobility status;Precautions  Activity Tolerance: Patient tolerated treatment well Patient left: in chair;with chair alarm set;with call bell/phone within reach  OT Visit Diagnosis: Unsteadiness on feet (R26.81);Muscle weakness (generalized) (M62.81);Other symptoms and signs involving cognitive function;Cognitive communication deficit (R41.841)  Time: 2376-2831 OT Time Calculation (min): 15 min Charges:  OT General Charges $OT Visit: 1 Visit OT Evaluation $OT Eval Moderate Complexity: 1 Mod   Brynn, OTR/L  Acute Rehabilitation Services Pager: [phone] Office: [phone] .   Jeri Modena 06/07/2019, 11:06  AM
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
## 2019-06-07 NOTE — Consult Note (Signed)
Physical Medicine and Rehabilitation Consult Reason for Consult: Decreased functional ability with word finding difficulty Referring Physician: Dr. Pearlean Brownie  HPI: William Ellison is a 59 y.o. right-handed male with history of hypertension.  History taken from chart review and wife due to cognition. Patient lives with spouse.  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  He presents on 06/07/19 with AMS and word finding difficulties. CT/MRI showed scattered foci of acute subacute nonhemorrhagic left MCA territory, predominantly posterior infarct.  Minimal involvement of the precentral gyrus.  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  Admission chemistries with creatinine 1.28, ammonia level 56, urinalysis negative nitrite, hemoglobin A1c 6.2.  Patient did not receive TPA.  Echocardiogram with EF on 65% with no wall motion abnormalities.  Neurology consulted, plan for TEE, work-up ongoing presently on aspirin and Plavix for CVA prophylaxis.  Subcutaneous Lovenox for DVT prophylaxis.  Bilateral lower extremity Dopplers negative.  Therapy evaluation completed with recommendations of physical medicine rehab consult.  Review of Systems  Unable to perform ROS: Mental acuity   Past Medical History:  Diagnosis Date  . Erectile dysfunction   . Hypertension   . Skin rash    No past surgical history on file. Family History  Problem Relation Age of Onset  . Hypertension Mother    Social History:  reports that he has never smoked. He has never used smokeless tobacco. He reports current alcohol use. He reports that he does not use drugs. Allergies: No Known Allergies Medications Prior to Admission  Medication Sig Dispense Refill  . amLODipine (NORVASC) 5 MG tablet Take 1 tablet (5 mg total) by mouth daily. (Patient not taking: Reported on 06/07/2019) 30 tablet 0  . benazepril-hydrochlorthiazide (LOTENSIN HCT) 20-12.5 MG tablet  Take 1 tablet by mouth daily. (Patient not taking: Reported on 06/07/2019) 30 tablet 3  . hydrocortisone cream 1 % Apply 1 application topically 2 (two) times daily. (Patient not taking: Reported on 06/07/2019) 30 g 3  . naproxen (NAPROSYN) 500 MG tablet Take 1 tablet twice daily as needed for pain. (Patient not taking: Reported on 06/25/2018) 15 tablet 0  . tadalafil (CIALIS) 10 MG tablet Take 1 tablet (10 mg total) by mouth daily as needed for erectile dysfunction. (Patient not taking: Reported on 06/07/2019) 10 tablet 0    Home: Home Living Family/patient expects to be discharged to:: Private residence Living Arrangements: Spouse/significant other Available Help at Discharge: Family, Available PRN/intermittently Type of Home: Apartment Home Access: Level entry Home Layout: One level Bathroom Shower/Tub: Engineer, manufacturing systems: Standard Home Equipment: None Additional Comments: pt is unreliable historian due to significant expressive and receptive aphasias  Lives With: Spouse  Functional History: Prior Function Level of Independence: Independent Comments: 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 Status:  Mobility: Bed Mobility Overal bed mobility: Needs Assistance Bed Mobility: Supine to Sit Supine to sit: Min assist General bed mobility comments: Required increased tactile cues in session, spreading legs to either side of the bed, had to physically move L leg in order to exit on R side Transfers Overall transfer level: Needs assistance Equipment used: Rolling walker (2 wheeled), 1 person hand held assist Transfers: Sit to/from Stand Sit to Stand: Min guard General transfer comment: used walker initially, however pt with increased confusion and carrying the walker vs pushing it, pt required max verbal cues to slow gait speed, noted to improve when provided HHA of 1 Ambulation/Gait  Ambulation/Gait assistance: Min Chemical engineer (Feet): 20 Feet Assistive device:  None Gait Pattern/deviations: Step-to pattern, Wide base of support General Gait Details: pt with short step to gait, increased lateral sway Gait velocity: reduced Gait velocity interpretation: <1.8 ft/sec, indicate of risk for recurrent falls    ADL: ADL Overall ADL's : Needs assistance/impaired Eating/Feeding: Minimal assistance, Sitting Grooming: Wash/dry hands, Minimal assistance, Standing, Oral care, Wash/dry face, Moderate assistance Grooming Details (indicate cue type and reason): Min A to wash hands, increased cues needed for teeth brushing (unable to manipulate appropriately without HOH A for tops and loading toothbrush) Upper Body Bathing: Moderate assistance Lower Body Bathing: Moderate assistance Upper Body Dressing : Moderate assistance Lower Body Dressing: Moderate assistance Toilet Transfer: Minimal assistance Toilet Transfer Details (indicate cue type and reason): Stated he needed to use bathroom, unable to void, requiring increased multi-modal cues to position self appropriately around toilet Toileting- Clothing Manipulation and Hygiene: Minimal assistance Functional mobility during ADLs: Minimal assistance General ADL Comments: Attempted use of RW in session, increased confusion and impulsivity, noted to be less impulsive with HHA  Cognition: Cognition Overall Cognitive Status: Impaired/Different from baseline Arousal/Alertness: Lethargic Orientation Level: Oriented to person Attention: Focused, Sustained Focused Attention: Appears intact Sustained Attention: Impaired Sustained Attention Impairment: Verbal basic Memory: (not assessed due to extent of language impairment) Cognition Arousal/Alertness: Awake/alert Behavior During Therapy: WFL for tasks assessed/performed Overall Cognitive Status: Impaired/Different from baseline Area of Impairment: Orientation, Attention, Memory, Following commands, Safety/judgement, Awareness, Problem solving Orientation Level:  Disoriented to, Place, Time, Situation Current Attention Level: Sustained Memory: Decreased recall of precautions, Decreased short-term memory Following Commands: Follows one step commands inconsistently Safety/Judgement: Decreased awareness of safety, Decreased awareness of deficits Awareness: Emergent Problem Solving: Slow processing, Requires verbal cues, Requires tactile cues, Difficulty sequencing General Comments: Unable to correctly pick between choice of two to date, only able to state yes or no inconsistently in session, able to follow basic commands with increased time at 25% accuracy  Blood pressure (!) 145/92, pulse 78, temperature 98.3 F (36.8 C), temperature source Oral, resp. rate 19, height 5\' 10"  (1.778 m), weight 133.8 kg, SpO2 100 %. Physical Exam  Vitals reviewed. Constitutional: He appears well-developed.  Obese  HENT:  Head: Normocephalic and atraumatic.  Eyes: Right eye exhibits no discharge. Left eye exhibits no discharge.  Keeps eyes closed  Neck: No tracheal deviation present. No thyromegaly present.  Respiratory: Effort normal. No stridor. No respiratory distress.  GI: Soft. He exhibits no distension.  Musculoskeletal:     Comments: No edema or tenderness in extremities  Neurological:  Somnolent Global aphasia Motor: Limited by ability to follow commands, moving left side freely No movement noted on right side Dysarthria Right neglect  Skin: Skin is warm and dry.  Psychiatric:  Unable to assess due to mentation    Results for orders placed or performed during the hospital encounter of 06/07/19 (from the past 24 hour(s))  CBC     Status: None   Collection Time: 06/10/19  5:15 AM  Result Value Ref Range   WBC 6.0 4.0 - 10.5 K/uL   RBC 4.56 4.22 - 5.81 MIL/uL   Hemoglobin 14.4 13.0 - 17.0 g/dL   HCT 06/12/19 16.1 - 09.6 %   MCV 94.1 80.0 - 100.0 fL   MCH 31.6 26.0 - 34.0 pg   MCHC 33.6 30.0 - 36.0 g/dL   RDW 04.5 40.9 - 81.1 %   Platelets 259 150 -  400 K/uL   nRBC 0.0  0.0 - 0.2 %  Basic metabolic panel     Status: Abnormal   Collection Time: 06/10/19  5:15 AM  Result Value Ref Range   Sodium 139 135 - 145 mmol/L   Potassium 3.4 (L) 3.5 - 5.1 mmol/L   Chloride 104 98 - 111 mmol/L   CO2 28 22 - 32 mmol/L   Glucose, Bld 136 (H) 70 - 99 mg/dL   BUN 6 6 - 20 mg/dL   Creatinine, Ser 9.601.23 0.61 - 1.24 mg/dL   Calcium 8.9 8.9 - 45.410.3 mg/dL   GFR calc non Af Amer >60 >60 mL/min   GFR calc Af Amer >60 >60 mL/min   Anion gap 7 5 - 15   VAS US LOWER EXTREMITY VENOUS (DVT)  Result Date: 06/09/2019  Lower Venous DVT Study Indications: Stroke.  Comparison Study: No prior study on file for comparison Performing Technologist: Sherren Kernsandace Kanady RVS  Examination Guidelines: A complete evaluation includes B-mode imaging, spectral Doppler, color Doppler, and power Doppler as needed of all accessible portions of each vessel. Bilateral testing is considered an integral part of a complete examination. Limited examinations for reoccurring indications may be performed as noted. The reflux portion of the exam is performed with the patient in reverse Trendelenburg.  +---------+---------------+---------+-----------+----------+--------------+ RIGHT    CompressibilityPhasicitySpontaneityPropertiesThrombus Aging +---------+---------------+---------+-----------+----------+--------------+ CFV      Full           Yes      Yes                                 +---------+---------------+---------+-----------+----------+--------------+ SFJ      Full                                                        +---------+---------------+---------+-----------+----------+--------------+ FV Prox  Full                                                        +---------+---------------+---------+-----------+----------+--------------+ FV Mid   Full                                                         +---------+---------------+---------+-----------+----------+--------------+ FV DistalFull                                                        +---------+---------------+---------+-----------+----------+--------------+ PFV      Full                                                        +---------+---------------+---------+-----------+----------+--------------+ POP      Full  Yes      Yes                                 +---------+---------------+---------+-----------+----------+--------------+ PTV      Full                                                        +---------+---------------+---------+-----------+----------+--------------+ PERO     Full                                                        +---------+---------------+---------+-----------+----------+--------------+   +---------+---------------+---------+-----------+----------+--------------+ LEFT     CompressibilityPhasicitySpontaneityPropertiesThrombus Aging +---------+---------------+---------+-----------+----------+--------------+ CFV      Full           Yes      Yes                                 +---------+---------------+---------+-----------+----------+--------------+ SFJ      Full                                                        +---------+---------------+---------+-----------+----------+--------------+ FV Prox  Full                                                        +---------+---------------+---------+-----------+----------+--------------+ FV Mid   Full                                                        +---------+---------------+---------+-----------+----------+--------------+ FV DistalFull                                                        +---------+---------------+---------+-----------+----------+--------------+ PFV      Full                                                         +---------+---------------+---------+-----------+----------+--------------+ POP      Full           Yes      Yes                                 +---------+---------------+---------+-----------+----------+--------------+ PTV  Full                                                        +---------+---------------+---------+-----------+----------+--------------+ PERO     Full                                                        +---------+---------------+---------+-----------+----------+--------------+     Summary: BILATERAL: - No evidence of deep vein thrombosis seen in the lower extremities, bilaterally. -   *See table(s) above for measurements and observations.    Preliminary     Assessment/Plan: Diagnosis: Left MCA territory infarcts Stroke: Continue secondary stroke prophylaxis and Risk Factor Modification listed below:   Antiplatelet therapy:   Blood Pressure Management:  Continue current medication with prn's with permisive HTN per primary team Statin Agent:   Prediabetes management:   Right sided hemiparesis: fit for orthosis to prevent contractures (resting hand splint for day, wrist cock up splint at night, PRAFO, etc) Labs independently reviewed.  Records reviewed and summated above.  1. Does the need for close, 24 hr/day medical supervision in concert with the patient's rehab needs make it unreasonable for this patient to be served in a less intensive setting? Yes 2. Co-Morbidities requiring supervision/potential complications: HTN (monitor and provide prns in accordance with increased physical exertion and pain), Prediabetes (Monitor in accordance with exercise and adjust meds as necessary), hypokalemia (continue to monitor and replete as necessary) 3. Due to bladder management, bowel management, safety, skin/wound care, disease management, medication administration and patient education, does the patient require 24 hr/day rehab nursing? Yes 4. Does the patient  require coordinated care of a physician, rehab nurse, therapy disciplines of PT/OT/SLP to address physical and functional deficits in the context of the above medical diagnosis(es)? Yes Addressing deficits in the following areas: balance, endurance, locomotion, strength, transferring, bathing, dressing, toileting, cognition, speech, language and psychosocial support 5. Can the patient actively participate in an intensive therapy program of at least 3 hrs of therapy per day at least 5 days per week? Yes 6. The potential for patient to make measurable gains while on inpatient rehab is excellent 7. Anticipated functional outcomes upon discharge from inpatient rehab are supervision and min assist  with PT, supervision and min assist with OT, supervision and min assist with SLP. 8. Estimated rehab length of stay to reach the above functional goals is: 15-20 days. 9. Anticipated discharge destination: Home 10. Overall Rehab/Functional Prognosis: good  RECOMMENDATIONS: This patient's condition is appropriate for continued rehabilitative care in the following setting: Patient will likely require CIR, however recommend reeval by therapies.  Patient did relatively well on day of evaluation, but was not able to participate in therapy thereafter.  This morning noted to have limited supination/engagement,?  Due to lethargy versus stroke. Patient has agreed to participate in recommended program. Potentially Note that insurance prior authorization may be required for reimbursement for recommended care.  Comment: Rehab Admissions Coordinator to follow up.  I have personally performed a face to face diagnostic evaluation, including, but not limited to relevant history and physical exam findings, of this patient and developed relevant assessment and plan.  Additionally, I have reviewed  and concur with the physician assistant's documentation above.   Delice Lesch, MD, ABPMR Lavon Paganini Angiulli, PA-C 06/10/2019

## 2019-06-07 NOTE — H&P (Signed)
Neurology H&P  CC: Aphasia  History is obtained from:Significant other, chart  HPI: William Ellison is a 59 y.o. male who was last ni his normal state of health prior to bed on 05/26.  This morning, he was still doing okay but had some confusion, however he passed out around 2 PM and then subsequently was much worse having trouble with word finding.  He was finally brought into the emergency department this evening, and was outside the 24-hour window   LKW: 5/26, late evening, unclear exact time. tpa given?: No, outside of window IR Thrombectomy? No, outside of window Modified Rankin Scale: 0-Completely asymptomatic and back to baseline post- stroke NIHSS: 8   ROS: A complete ROS was performed and is negative except as noted in the HPI.   Past Medical History:  Diagnosis Date  . Erectile dysfunction   . Hypertension   . Skin rash      Family History  Problem Relation Age of Onset  . Hypertension Mother      Social History:  reports that he has never smoked. He has never used smokeless tobacco. He reports current alcohol use. He reports that he does not use drugs.   Prior to Admission medications   Medication Sig Start Date End Date Taking? Authorizing Provider  amLODipine (NORVASC) 5 MG tablet Take 1 tablet (5 mg total) by mouth daily. 06/20/18   Bill Salinas, PA-C  benazepril-hydrochlorthiazide (LOTENSIN HCT) 20-12.5 MG tablet Take 1 tablet by mouth daily. 06/25/18   Kallie Locks, FNP  hydrocortisone cream 1 % Apply 1 application topically 2 (two) times daily. 06/20/18   Kallie Locks, FNP  naproxen (NAPROSYN) 500 MG tablet Take 1 tablet twice daily as needed for pain. Patient not taking: Reported on 06/25/2018 03/30/17   Molpus, Jonny Ruiz, MD  tadalafil (CIALIS) 10 MG tablet Take 1 tablet (10 mg total) by mouth daily as needed for erectile dysfunction. 06/20/18   Kallie Locks, FNP     Exam: Current vital signs: BP (!) 171/89   Pulse (!) 58   Temp 98.6 F  (37 C) (Oral)   Resp 17   Ht 5\' 10"  (1.778 m)   Wt 133.8 kg   SpO2 97%   BMI 42.33 kg/m    Physical Exam  Constitutional: Appears well-developed and well-nourished.  Psych: Affect appropriate to situation Eyes: No scleral injection HENT: No OP obstrucion Head: Normocephalic.  Cardiovascular: Normal rate and regular rhythm.  Respiratory: Effort normal and breath sounds normal to anterior ascultation GI: Soft.  No distension. There is no tenderness.  Skin: WDI  Neuro: Mental Status: Patient is awake, alert, he has a significant aphasia with some difficulty following commands as well as answering questions.  He has some perseveration, saying yes over and over again inappropriately. Cranial Nerves: II: R upper field cut.  Pupils are equal, round, and reactive to light.   III,IV, VI: EOMI without ptosis or diploplia.  V: Facial sensation is symmetric to temperature VII: Facial movement is symmetric.  VIII: hearing is intact to voice X: Uvula elevates symmetrically XI: Shoulder shrug is symmetric. XII: tongue is midline without atrophy or fasciculations.  Motor: Tone is normal. Bulk is normal. Though he doe snot drift, he does have mild weakness sin the right arm and leg.  Sensory: Sensation is symmetric to light touch and temperature in the arms and legs. Cerebellar: Impaired fine motor movements in the righ tarm.   I have reviewed labs in epic and the pertinent  results are: Cr 1.28  I have reviewed the images obtained: CT/CTA  Primary Diagnosis:  Cerebral infarction due to thrombosis of left middle cerebral artery.   Secondary Diagnosis: Essential (primary) hypertension   Impression: 59 year old male with left MCA occlusion.  Given the degree of collateralization I suspect that this is an acute on chronic process.  He is now more than 24 hours from his last known well and I suspect that the CT perfusion is underestimating the degree of infarct given that there appears  to be some early ischemic change in the subcortical regions on the left.  At the current time, I would not favor taking thrombectomy given that he is outside the window, however if he does have further worsening then this may still need to be considered.  For closer monitoring, I would favor admitting to the ICU so that we know if he has further worsening.  Plan: - HgbA1c, fasting lipid panel - MRI of the brain without contrast - Frequent neuro checks - Echocardiogram - Prophylactic therapy-Antiplatelet med: Aspirin - dose 325mg  PO or 300mg  PR - Risk factor modification - Telemetry monitoring - PT consult, OT consult, Speech consult - Stroke team to follow  This patient is critically ill and at significant risk of neurological worsening, death and care requires constant monitoring of vital signs, hemodynamics,respiratory and cardiac monitoring, neurological assessment, discussion with family, other specialists and medical decision making of high complexity. I spent 65 minutes of neurocritical care time  in the care of  this patient. This was time spent independent of any time provided by nurse practitioner or PA.  Roland Rack, MD [facility] [phone]  If 7pm- 7am, please page neurology on call as listed in [place].
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
## 2019-06-08 DIAGNOSIS — I63412 Cerebral infarction due to embolism of left middle cerebral artery: Secondary | ICD-10-CM | POA: Diagnosis not present

## 2019-06-08 DIAGNOSIS — E785 Hyperlipidemia, unspecified: Secondary | ICD-10-CM | POA: Diagnosis not present

## 2019-06-08 DIAGNOSIS — R0989 Other specified symptoms and signs involving the circulatory and respiratory systems: Secondary | ICD-10-CM | POA: Diagnosis not present

## 2019-06-08 DIAGNOSIS — R7303 Prediabetes: Secondary | ICD-10-CM | POA: Diagnosis not present

## 2019-06-08 DIAGNOSIS — G479 Sleep disorder, unspecified: Secondary | ICD-10-CM | POA: Diagnosis not present

## 2019-06-08 DIAGNOSIS — E876 Hypokalemia: Secondary | ICD-10-CM | POA: Diagnosis not present

## 2019-06-08 DIAGNOSIS — I63512 Cerebral infarction due to unspecified occlusion or stenosis of left middle cerebral artery: Secondary | ICD-10-CM | POA: Diagnosis not present

## 2019-06-08 DIAGNOSIS — I10 Essential (primary) hypertension: Secondary | ICD-10-CM | POA: Diagnosis not present

## 2019-06-08 DIAGNOSIS — I639 Cerebral infarction, unspecified: Secondary | ICD-10-CM | POA: Diagnosis not present

## 2019-06-08 NOTE — Plan of Care (Signed)
  Problem: Self-Care: Goal: Ability to participate in self-care as condition permits will improve Outcome: Progressing   Problem: Self-Care: Goal: Ability to communicate needs accurately will improve Outcome: Progressing   Problem: Nutrition: Goal: Risk of aspiration will decrease Outcome: Progressing   Problem: Nutrition: Goal: Dietary intake will improve Outcome: Progressing

## 2019-06-08 NOTE — Progress Notes (Signed)
SLP Cancellation Note  Patient Details Name: William Ellison MRN: [id number] DOB: [birth year]   Cancelled treatment:       Reason Eval/Treat Not Completed: Fatigue/lethargy limiting ability to participate   Tressie Stalker, M.S.,CCC-SLP 06/08/2019, 1:35 PM

## 2019-06-08 NOTE — Progress Notes (Signed)
Occupational Therapy Treatment Patient Details Name: William Ellison MRN: [id number] DOB: 1960/11/24 Today's Date: 06/08/2019    History of present illness 59 y.o. male who was last in his normal state of health prior to bed on 05/26.  This morning, he was still doing okay but had some confusion, however he passed out around 2 PM and then subsequently was much worse having trouble with word finding. 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 Pt was outside the window for TPA. PMH includes HTN and erectile dysfunction.   OT comments  Patient continues to make steady progress towards goals in skilled OT session. Patient's session encompassed further assessment of cognition, functional mobility, and ADLs. Pt with noted change in ability to verbalize in session. Per last OT note, pt was able to provide answer when given the choice of two. Attempted this multiple times throughout the session, and patient was unable to complete. Pt continues to require increased multi-modal cues for sequencing of basic ADLs, however with increased time and min-mod A is able to complete. Pt would continue to benefit greatly from intensive rehab in order to address functional deficits aforementioned; will continue to follow acutely.    Follow Up Recommendations  CIR    Equipment Recommendations  None recommended by OT    Recommendations for Other Services Rehab consult    Precautions / Restrictions Precautions Precautions: Fall Restrictions Weight Bearing Restrictions: No       Mobility Bed Mobility Overal bed mobility: Needs Assistance Bed Mobility: Supine to Sit     Supine to sit: Min assist     General bed mobility comments: Required increased tactile cues in session, spreading legs to either side of the bed, had to physically move L leg in order to exit on R side  Transfers Overall transfer level: Needs assistance Equipment  used: Rolling walker (2 wheeled);1 person hand held assist Transfers: Sit to/from Stand Sit to Stand: Min guard         General transfer comment: used walker initially, however pt with increased confusion and carrying the walker vs pushing it, pt required max verbal cues to slow gait speed, noted to improve when provided HHA of 1    Balance Overall balance assessment: Needs assistance Sitting-balance support: Single extremity supported;Feet supported Sitting balance-Leahy Scale: Fair Sitting balance - Comments: close supervision at the edge of bed   Standing balance support: No upper extremity supported;During functional activity Standing balance-Leahy Scale: Poor Standing balance comment: min-mod A with HHA solely due to pt gait speed, not paying attention to environment or IV pole and often wrapping himself around despite verbal and tactile cues                           ADL either performed or assessed with clinical judgement   ADL Overall ADL's : Needs assistance/impaired     Grooming: Wash/dry hands;Minimal assistance;Standing;Oral care;Wash/dry face;Moderate assistance Grooming Details (indicate cue type and reason): Min A to wash hands, increased cues needed for teeth brushing (unable to manipulate appropriately without HOH A for tops and loading toothbrush)                 Toilet Transfer: Minimal assistance Toilet Transfer Details (indicate cue type and reason): Stated he needed to use bathroom, unable to void, requiring increased multi-modal cues to position self appropriately around toilet Toileting- Clothing Manipulation and Hygiene: Minimal assistance  Functional mobility during ADLs: Minimal assistance General ADL Comments: Attempted use of RW in session, increased confusion and impulsivity, noted to be less impulsive with HHA     Vision       Perception     Praxis      Cognition Arousal/Alertness: Awake/alert Behavior During  Therapy: WFL for tasks assessed/performed Overall Cognitive Status: Impaired/Different from baseline Area of Impairment: Orientation;Attention;Memory;Following commands;Safety/judgement;Awareness;Problem solving                 Orientation Level: Disoriented to;Place;Time;Situation Current Attention Level: Sustained Memory: Decreased recall of precautions;Decreased short-term memory Following Commands: Follows one step commands inconsistently Safety/Judgement: Decreased awareness of safety;Decreased awareness of deficits Awareness: Emergent Problem Solving: Slow processing;Requires verbal cues;Requires tactile cues;Difficulty sequencing General Comments: Unable to correctly pick between choice of two to date, only able to state yes or no inconsistently in session, able to follow basic commands with increased time at 25% accuracy        Exercises     Shoulder Instructions       General Comments      Pertinent Vitals/ Pain       Pain Assessment: Faces Faces Pain Scale: No hurt  Home Living                                          Prior Functioning/Environment              Frequency  Min 3X/week        Progress Toward Goals  OT Goals(current goals can now be found in the care plan section)  Progress towards OT goals: Progressing toward goals  Acute Rehab OT Goals Patient Stated Goal: Pt unable to state OT Goal Formulation: Patient unable to participate in goal setting Time For Goal Achievement: 06/21/19 Potential to Achieve Goals: Good  Plan Discharge plan remains appropriate    Co-evaluation                 AM-PAC OT "6 Clicks" Daily Activity     Outcome Measure   Help from another person eating meals?: A Little Help from another person taking care of personal grooming?: A Little Help from another person toileting, which includes using toliet, bedpan, or urinal?: A Lot Help from another person bathing (including washing,  rinsing, drying)?: A Lot Help from another person to put on and taking off regular upper body clothing?: A Little Help from another person to put on and taking off regular lower body clothing?: A Lot 6 Click Score: 15    End of Session Equipment Utilized During Treatment: Gait belt  OT Visit Diagnosis: Unsteadiness on feet (R26.81);Muscle weakness (generalized) (M62.81);Other symptoms and signs involving cognitive function;Cognitive communication deficit (R41.841)   Activity Tolerance Patient tolerated treatment well   Patient Left in chair;with chair alarm set;with call bell/phone within reach   Nurse Communication Mobility status;Precautions        Time: 1610-9604 OT Time Calculation (min): 23 min  Charges: OT General Charges $OT Visit: 1 Visit OT Treatments $Self Care/Home Management : 23-37 mins  Hornell. Jesslyn Viglione, COTA/L Acute Rehabilitation Services [place] 06/08/2019, 10:07 AM

## 2019-06-08 NOTE — Progress Notes (Signed)
PT Cancellation Note  Patient Details Name: William Ellison MRN: [id number] DOB: [birth year]   Cancelled Treatment:    Reason Eval/Treat Not Completed: Fatigue/lethargy limiting ability to participate. Pt in recliner on arrival with spouse lying in bed. Neither this PTA or spouse could arouse pt to participate with therapy. Pt would briefly open eyes and mumbled a few times, then resumed sleeping/snoring. Will follow up today as time allows vs another date.  Sallyanne Kuster, PTA, CLT Acute Rehab Services [phone] 06/08/19, 11:07 AM   Sallyanne Kuster 06/08/2019, 11:06 AM

## 2019-06-08 NOTE — Progress Notes (Signed)
STROKE TEAM PROGRESS NOTE   INTERVAL HISTORY  This is a patient who presented with expressive aphasia CT scan on admission showed subacute left parietal MCA branch infarct, CTA showed M1 occlusion, MRI showed embolic left MCA strokes, he is stable and pending further work-up including transesophageal echocardiogram and possibly loop recorder next week.  Male friend is at bedside, he is stable, still with expressive > receptive aphasia, he laughs appropriately at jokes but having difficulty with verbal tasks such as "give me a thumbs up". Pending further evaluation.     Vitals:   06/07/19 1951 06/07/19 2255 06/07/19 2325 06/08/19 0341  BP: (!) 146/90 (!) 131/91 (!) 142/98 (!) 176/98  Pulse: 70 69 65 69  Resp: 18 18 19 19   Temp: 98.8 F (37.1 C) 97.7 F (36.5 C) 98.3 F (36.8 C) 97.7 F (36.5 C)  TempSrc: Oral Oral Oral Oral  SpO2: 97% 95% 97% 100%  Weight:      Height:        CBC:  Recent Labs  Lab 06/07/19 0112  WBC 6.5  NEUTROABS 3.3  HGB 15.4  HCT 45.6  MCV 93.4  PLT 161    Basic Metabolic Panel:  Recent Labs  Lab 06/07/19 0112  NA 139  K 4.0  CL 103  CO2 26  GLUCOSE 106*  BUN 13  CREATININE 1.28*  CALCIUM 9.2   Lipid Panel:     Component Value Date/Time   CHOL 198 06/07/2019 0653   TRIG 67 06/07/2019 0653   HDL 37 (L) 06/07/2019 0653   CHOLHDL 5.4 06/07/2019 0653   VLDL 13 06/07/2019 0653   LDLCALC 148 (H) 06/07/2019 0653   HgbA1c:  Lab Results  Component Value Date   HGBA1C 6.2 (H) 06/07/2019   Urine Drug Screen: No results found for: LABOPIA, COCAINSCRNUR, LABBENZ, AMPHETMU, THCU, LABBARB  Alcohol Level No results found for: [facility]  IMAGING past 24 hours ECHOCARDIOGRAM COMPLETE  Result Date: 06/07/2019    ECHOCARDIOGRAM REPORT   Patient Name:   William Ellison Date of Exam: 06/07/2019 Medical Rec #:  [id number]       Height:       70.0 in Accession #:    [id number]      Weight:       295.0 lb Date of Birth:  08-10-1960       BSA:          2.462  m Patient Age:    59 years        BP:           160/111 mmHg Patient Gender: M               HR:           71 bpm. Exam Location:  Inpatient Procedure: 2D Echo, Cardiac Doppler, Color Doppler and Intracardiac            Opacification Agent Indications:    Stroke  History:        Patient has no prior history of Echocardiogram examinations.                 Risk Factors:Hypertension.  Sonographer:    Clayton Lefort RDCS (AE) Referring Phys: [phone] [facility]  Sonographer Comments: Technically difficult study due to poor echo windows. IMPRESSIONS  1. Left ventricular ejection fraction, by estimation, is 60 to 65%. The left ventricle has normal function. The left ventricle has no regional wall motion abnormalities. There is moderate left ventricular hypertrophy. Left  ventricular diastolic parameters are consistent with Grade I diastolic dysfunction (impaired relaxation).  2. Right ventricular systolic function is normal. The right ventricular size is normal.  3. The mitral valve is grossly normal. Trivial mitral valve regurgitation.  4. The aortic valve was not well visualized. Aortic valve regurgitation is not visualized. No aortic stenosis is present. FINDINGS  Left Ventricle: Left ventricular ejection fraction, by estimation, is 60 to 65%. The left ventricle has normal function. The left ventricle has no regional wall motion abnormalities. Definity contrast agent was given IV to delineate the left ventricular  endocardial borders. The left ventricular internal cavity size was normal in size. There is moderate left ventricular hypertrophy. Left ventricular diastolic parameters are consistent with Grade I diastolic dysfunction (impaired relaxation). Indeterminate filling pressures. Right Ventricle: The right ventricular size is normal. No increase in right ventricular wall thickness. Right ventricular systolic function is normal. Left Atrium: Left atrial size was normal in size. Right Atrium: Right atrial size was  normal in size. Pericardium: There is no evidence of pericardial effusion. Mitral Valve: The mitral valve is grossly normal. Trivial mitral valve regurgitation. MV peak gradient, 2.1 mmHg. The mean mitral valve gradient is 1.0 mmHg. Tricuspid Valve: The tricuspid valve is not well visualized. Tricuspid valve regurgitation is trivial. Aortic Valve: The aortic valve was not well visualized. Aortic valve regurgitation is not visualized. No aortic stenosis is present. Pulmonic Valve: The pulmonic valve was normal in structure. Pulmonic valve regurgitation is not visualized. Aorta: The aortic root and ascending aorta are structurally normal, with no evidence of dilitation. IAS/Shunts: The interatrial septum was not well visualized.  LEFT VENTRICLE PLAX 2D LVIDd:         4.50 cm  Diastology LVIDs:         2.68 cm  LV e' lateral:   9.03 cm/s LV PW:         1.79 cm  LV E/e' lateral: 6.3 LV IVS:        1.41 cm  LV e' medial:    5.11 cm/s LVOT diam:     2.20 cm  LV E/e' medial:  11.1 LV SV:         59 LV SV Index:   24 LVOT Area:     3.80 cm  RIGHT VENTRICLE RV Basal diam:  2.88 cm RV S prime:     13.20 cm/s TAPSE (M-mode): 2.1 cm LEFT ATRIUM             Index       RIGHT ATRIUM           Index LA diam:        3.20 cm 1.30 cm/m  RA Area:     18.00 cm LA Vol (A2C):   29.4 ml 11.94 ml/m RA Volume:   54.70 ml  22.22 ml/m LA Vol (A4C):   39.0 ml 15.84 ml/m LA Biplane Vol: 36.0 ml 14.62 ml/m  AORTIC VALVE AV Area (Vmax):    3.03 cm AV Area (Vmean):   3.07 cm AV Area (VTI):     3.61 cm AV Vmax:           98.70 cm/s AV Vmean:          71.500 cm/s AV VTI:            0.162 m AV Peak Grad:      3.9 mmHg AV Mean Grad:      2.0 mmHg LVOT Vmax:  78.80 cm/s LVOT Vmean:        57.700 cm/s LVOT VTI:          0.154 m LVOT/AV VTI ratio: 0.95  AORTA Ao Root diam: 3.30 cm Ao Asc diam:  3.50 cm MITRAL VALVE MV Area (PHT): 3.77 cm    SHUNTS MV Peak grad:  2.1 mmHg    Systemic VTI:  0.15 m MV Mean grad:  1.0 mmHg    Systemic Diam:  2.20 cm MV Vmax:       0.72 m/s MV Vmean:      42.5 cm/s MV Decel Time: 201 msec MV E velocity: 56.90 cm/s MV A velocity: 68.90 cm/s MV E/A ratio:  0.83 Zoila Shutter MD Electronically signed by Zoila Shutter MD Signature Date/Time: 06/07/2019/11:33:14 AM    Final     PHYSICAL EXAM Pleasant middle-aged obese African-American male sitting up in chair.  Not in distress. . Afebrile. Head is nontraumatic. Neck is supple without bruit.    Cardiac exam no murmur or gallop. Lungs are clear to auscultation. Distal pulses are well felt. Neurological Exam ;  Awake  Alert. Significant aphasia, expressive>receptive.Nonfluent speech with significant verbal perseveration and only answering yes and no.  Follows only simple midline and one-step commands with visual cues.  Unable to name and repeat and comprehend..eye movements full without nystagmus.fundi were not visualized. Blinks to threat bilaterally.Hearing appears normal to voice. Palatal movements are normal. Face asymmetric with right lower facial weakness.. Tongue midline.Normal strength, tone, reflexes and coordination. Normal sensation. Gait deferred.  ASSESSMENT/PLAN William Ellison is a 59 y.o. male with morbid obesity who woke with confusion then passed out. Later developed word finding difficulties. Out of tPA & IR window. Admitted to NICU to assess for further worsening.  NIH stroke scale 7 Stroke:   L MCA territory infarcts embolic secondary to unknown source vs large vessel disease given LVO L M1 occlusion   CT head multifocial L cerebral infarcts. ? L MCA hyperdensity. No hemorrhage. ASPECTS 6  CTA head & neck L M1 occlusion.   CT perfusion L MCA cord underestimated. Penumbra < 85mL (probably 20-29mL)  MRI  Scattered L MCA territory infarcts. abnormal distal L M1 and proximal M2 c/w occlusion.  2D Echo pending  Check LE doppler  TEE to look for embolic source. Will arranged with [facility] for next week (likely  Tues as [place] is a holiday).   If TEE negative, a [facility] electrophysiologist will consult and consider placement of an implantable loop recorder to evaluate for atrial fibrillation as etiology of stroke. This has been explained to patient/family by Dr. Pearlean Brownie and Dr. Lucia Gaskins and they are agreeable. Patient will need rehabilitation, will stay for TEE and touch base with cardiology over the weekend.  LDL 148  HgbA1c 6.2  SCDs for VTE prophylaxis. Change to Lovenox 40 mg sq daily   No antithrombotic prior to admission, now on aspirin 300 mg suppository daily. Add plavix and decrease aspirin to 81 mg daily x 3 months then aspirin alone given LVO.    Therapy recommendations:  pending   Disposition:  pending   Transfer to the floor  Hypertensive Urgency  BP as high as 160/111  Home meds:  norvasc 5, benazepril-HCTZ 20-12.5  Stable . Permissive hypertension (OK if < 220/120) but gradually normalize in 5-7 days . Long-term BP goal normotensive  Hyperlipidemia  Home meds:  No statin  Add lipitor 80  LDL 148, goal <  70  Continue statin at discharge  Pre-Diabetes   HgbA1c 6.2, goal < 7.0  Other Stroke Risk Factors  ETOH use, advised to drink no more than 2 drink(s) a day  Morbid Obesity, Body mass index is 42.33 kg/m., recommend weight loss, diet and exercise as appropriate   Likely obstructive sleep apnea, needs OP evaluation  later   Other Active Problems  AKI 1.28    Hospital day # 1   Personally examined patient and images, and have participated in and made any corrections needed to history, physical, neuro exam,assessment and plan as stated above.  I have personally obtained the history, evaluated lab date, reviewed imaging studies and agree with radiology interpretations.    Naomie Dean, MD Stroke Neurology   A total of 35 minutes was spent for the care of this patient, spent on counseling patient and family on different diagnostic  and therapeutic options, counseling and coordination of care, riskd ans benefits of management, compliance, or risk factor reduction and education.     To contact Stroke Continuity provider, please refer to [email or web address]. After hours, contact General Neurology

## 2019-06-09 ENCOUNTER — Inpatient Hospital Stay (HOSPITAL_COMMUNITY): Payer: BC Managed Care – PPO

## 2019-06-09 DIAGNOSIS — I639 Cerebral infarction, unspecified: Secondary | ICD-10-CM

## 2019-06-09 NOTE — Evaluation (Signed)
Speech Language Pathology Evaluation Patient Details Name: William Ellison MRN: [id number] DOB: [birth year] Today's Date: 06/09/2019 Time: 5573-2202 SLP Time Calculation (min) (ACUTE ONLY): 20 min  Problem List:  Patient Active Problem List   Diagnosis Date Noted  . 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 Hypertensive urgency 06/25/2018  . Hypertension 06/25/2018  . Blood pressure elevated without history of HTN 06/20/2018  . Class 3 severe obesity due to excess calories with serious comorbidity and body mass index (BMI) of 40.0 to 44.9 in adult (HCC) 06/20/2018  . Erectile dysfunction 06/20/2018  . Skin rash 06/20/2018   Past Medical History:  Past Medical History:  Diagnosis Date  . Erectile dysfunction   . Hypertension   . Skin rash    Past Surgical History: No past surgical history on file. HPI:  59 y.o. male with hx HTN was last in his normal state of health prior to going to bed on 05/26.  The following morning, he was still doing okay but had some confusion, however he passed out around 2 PM and then subsequently was much worse having trouble with word finding. 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 Pt was outside the window for TPA.    Assessment / Plan / Recommendation Clinical Impression  Pt presents with signicant non-fluent expressive and receptive aphasia.  He was sleepy today, and participation fluctuated.  His wife was at bedside. Comprehension is marked by difficulty following one-step commands, answering yes/no questions about self reliably, and discriminating between two functional objects.  Performance improves in predictable, highly overlearned contexts.  Expression is characterized by single word responses, often repeated in perseverative manner when target word has changed.  Pt unable to name to confrontation, repeat, nor produce overlearned sequences (counting, singing).  He required  maximal visual/verbal/tactile cues to sustain wakefulness/attention today.  In one incident, he produced repetitive, unintelligible output and gestured across the room to communicate to his wife, who guessed he needed the urinal, and he nodded emphatically in affirmation.  Pt will require intensive speech therapy to address his aphasia, to assist with basic communication and comprehension.  D/W pt's wife the nature of aphasia and f/u therapy.  She verbalized agreement.     SLP Assessment  SLP Recommendation/Assessment: Patient needs continued Speech Lanaguage Pathology Services SLP Visit Diagnosis: Aphasia (R47.01)    Follow Up Recommendations  Inpatient Rehab    Frequency and Duration min 3x week  2 weeks      SLP Evaluation Cognition  Overall Cognitive Status: Impaired/Different from baseline Arousal/Alertness: Lethargic Orientation Level: Oriented to person Attention: Focused;Sustained Focused Attention: Appears intact Sustained Attention: Impaired Sustained Attention Impairment: Verbal basic Memory: (not assessed due to extent of language impairment)       Comprehension  Auditory Comprehension Overall Auditory Comprehension: Impaired Yes/No Questions: Impaired Basic Biographical Questions: 26-50% accurate Commands: Impaired One Step Basic Commands: 0-24% accurate EffectiveTechniques: Extra processing time;Increased volume;Slowed speech;Visual/Gestural cues Visual Recognition/Discrimination Discrimination: Exceptions to [facility] Common Objects: Other (comment)(unable in field of two) Reading Comprehension Reading Status: Not tested    Expression Expression Primary Mode of Expression: Verbal Verbal Expression Overall Verbal Expression: Impaired Automatic Speech: (severe impairment) Level of Generative/Spontaneous Verbalization: (jargon) Repetition: Impaired Level of Impairment: Word level Naming: Impairment Confrontation: Impaired Common Objects: Other (comment)(unable  in field of two) Convergent: Not tested Divergent: Not tested Verbal Errors: Perseveration;Phonemic paraphasias Written Expression Dominant Hand: Right Written Expression: Not tested   Oral / Motor  Oral Motor/Sensory Function Overall Oral Motor/Sensory Function: Mild impairment Facial Symmetry: Abnormal symmetry right;Suspected CN VII (facial) dysfunction Lingual ROM: Within Functional Limits Motor Speech Overall Motor Speech: Other (comment)(unintelligible speech) Phonation: Normal Resonance: Within functional limits Articulation: Impaired Level of Impairment: Word Intelligibility: Intelligibility reduced Word: 0-24% accurate Motor Planning: (tba)   GO                    William Ellison William Ellison 06/09/2019, 2:02 PM William Ellison L. Tivis Ringer, [place] Office number [phone] Pager [phone]

## 2019-06-09 NOTE — Progress Notes (Signed)
STROKE TEAM PROGRESS NOTE   INTERVAL HISTORY  This is a patient who presented with expressive aphasia CT scan on admission showed subacute left parietal MCA branch infarct, CTA showed M1 occlusion, MRI showed embolic left MCA strokes, he is stable and pending further work-up including transesophageal echocardiogram and possibly loop recorder next week.  Male friend is at bedside again sleeping , he is stable, still with expressive > receptive aphasia, he laughs appropriately at jokes but still having difficulty with verbal tasks such as "give me a thumbs up". Pending further evaluation.     Vitals:   06/08/19 1456 06/08/19 2000 06/09/19 0100 06/09/19 0400  BP: (!) 143/92 (!) 142/84 (!) 159/95 (!) 154/103  Pulse: 85 98 67 75  Resp: 17     Temp: 98.6 F (37 C) 98 F (36.7 C) 98.2 F (36.8 C) 98 F (36.7 C)  TempSrc: Oral Oral Oral Oral  SpO2: 97% 98% 98% 100%  Weight:      Height:        CBC:  Recent Labs  Lab 06/07/19 0112  WBC 6.5  NEUTROABS 3.3  HGB 15.4  HCT 45.6  MCV 93.4  PLT 268    Basic Metabolic Panel:  Recent Labs  Lab 06/07/19 0112  NA 139  K 4.0  CL 103  CO2 26  GLUCOSE 106*  BUN 13  CREATININE 1.28*  CALCIUM 9.2   Lipid Panel:     Component Value Date/Time   CHOL 198 06/07/2019 0653   TRIG 67 06/07/2019 0653   HDL 37 (L) 06/07/2019 0653   CHOLHDL 5.4 06/07/2019 0653   VLDL 13 06/07/2019 0653   LDLCALC 148 (H) 06/07/2019 0653   HgbA1c:  Lab Results  Component Value Date   HGBA1C 6.2 (H) 06/07/2019   Urine Drug Screen: No results found for: LABOPIA, COCAINSCRNUR, LABBENZ, AMPHETMU, THCU, LABBARB  Alcohol Level No results found for: ETH  IMAGING past 24 hours No results found.  PHYSICAL EXAM Pleasant middle-aged obese African-American male sitting up in chair.  Not in distress. . Afebrile. Head is nontraumatic. Neck is supple without bruit.    Cardiac exam no murmur or gallop. Lungs are clear to auscultation. Distal pulses are well  felt. Neurological Exam ;  Drowsy, sleeping, but arousable. Significant aphasia, expressive>receptive.Nonfluent speech with significant verbal perseveration and only answering yes and no.  Follows only simple midline and one-step commands with visual cues.  Unable to name and repeat and comprehend..eye movements full without nystagmus.fundi were not visualized. Blinks to threat bilaterally.Hearing appears normal to voice. Palatal movements are normal. Face asymmetric with right lower facial weakness.. Tongue midline.Normal strength, tone, reflexes and coordination. Normal sensation. Gait deferred.  Transthoracic Echocardiogram  06/09/19 IMPRESSIONS  1. Left ventricular ejection fraction, by estimation, is 60 to 65%. The  left ventricle has normal function. The left ventricle has no regional  wall motion abnormalities. There is moderate left ventricular hypertrophy.  Left ventricular diastolic  parameters are consistent with Grade I diastolic dysfunction (impaired  relaxation).  2. Right ventricular systolic function is normal. The right ventricular  size is normal.  3. The mitral valve is grossly normal. Trivial mitral valve  regurgitation.  4. The aortic valve was not well visualized. Aortic valve regurgitation  is not visualized. No aortic stenosis is present.      ASSESSMENT/PLAN [name] is a 59 y.o. male with morbid obesity who woke with confusion then passed out. Later developed word finding difficulties. Out of tPA & IR  window. Admitted to NICU to assess for further worsening.  NIH stroke scale 7 Stroke:   L MCA territory infarcts embolic secondary to unknown source vs large vessel disease given LVO L M1 occlusion   CT head multifocial L cerebral infarcts. ? L MCA hyperdensity. No hemorrhage. ASPECTS 6  CTA head & neck L M1 occlusion.   CT perfusion L MCA cord underestimated. Penumbra < 2mL (probably 20-50mL)  MRI  Scattered L MCA territory infarcts. abnormal  distal L M1 and proximal M2 c/w occlusion.  2D Echo - EF 60 - 65%. No cardiac source of emboli identified.   Check LE doppler - will order  TEE to look for embolic source. Will arrange with [facility] for next week (likely Tues as [place] is a holiday).   If TEE negative, a [place] electrophysiologist will consult and consider placement of an implantable loop recorder to evaluate for atrial fibrillation as etiology of stroke. This has been explained to patient/family by Dr. Leonie Man and Dr. Jaynee Eagles and they are agreeable. Patient will need rehabilitation, will stay for TEE and touch base with cardiology over the weekend.   (will send message to [place] for possibly Tuesday)  LDL 148  HgbA1c 6.2  SCDs for VTE prophylaxis. Change to Lovenox 40 mg sq daily   No antithrombotic prior to admission, now on aspirin 300 mg suppository daily. Add plavix and decrease aspirin to 81 mg daily x 3 months then aspirin alone given LVO.    Therapy recommendations: CIR recommended  Disposition:  pending   Transfer to the floor  Hypertensive Urgency  BP as high as 160/111  Home meds:  norvasc 5, benazepril-HCTZ 20-12.5  Stable . Permissive hypertension (OK if < 220/120) but gradually normalize in 5-7 days . Long-term BP goal normotensive  Hyperlipidemia  Home meds:  No statin  Add lipitor 80  LDL 148, goal < 70  Continue statin at discharge  Pre-Diabetes   HgbA1c 6.2, goal < 7.0  Other Stroke Risk Factors  ETOH use, advised to drink no more than 2 drink(s) a day  Morbid Obesity, Body mass index is 42.33 kg/m., recommend weight loss, diet and exercise as appropriate   Likely obstructive sleep apnea, needs OP evaluation  later   Other Active Problems  AKI 1.28 - recheck in AM Monday  Hospital day # 2   Personally examined patient and images, and have participated in and made any corrections needed to history, physical, neuro  exam,assessment and plan as stated above.  I have personally obtained the history, evaluated lab date, reviewed imaging studies and agree with radiology interpretations.    Sarina Ill, MD Stroke Neurology   A total of 15 minutes was spent for the care of this patient, spent on counseling patient and family on different diagnostic and therapeutic options, counseling and coordination of care, riskd ans benefits of management, compliance, or risk factor reduction and education.    To contact Stroke Continuity provider, please refer to [email or web address] After hours, contact General Neurology

## 2019-06-09 NOTE — Progress Notes (Signed)
VASCULAR LAB PRELIMINARY  PRELIMINARY  PRELIMINARY  PRELIMINARY  Bilateral lower extremity venous duplex completed.    Preliminary report:  See CV proc for preliminary results.  Stellar Gensel, RVT 06/09/2019, 3:11 PM

## 2019-06-09 NOTE — Plan of Care (Signed)
  Problem: Nutrition: Goal: Adequate nutrition will be maintained Outcome: Progressing   Problem: Pain Managment: Goal: General experience of comfort will improve Outcome: Progressing   Problem: Nutrition: Goal: Risk of aspiration will decrease Outcome: Progressing   Problem: Nutrition: Goal: Dietary intake will improve Outcome: Progressing

## 2019-06-10 DIAGNOSIS — E876 Hypokalemia: Secondary | ICD-10-CM

## 2019-06-10 DIAGNOSIS — I639 Cerebral infarction, unspecified: Secondary | ICD-10-CM

## 2019-06-10 DIAGNOSIS — I10 Essential (primary) hypertension: Secondary | ICD-10-CM | POA: Diagnosis present

## 2019-06-10 DIAGNOSIS — R7303 Prediabetes: Secondary | ICD-10-CM

## 2019-06-10 LAB — BASIC METABOLIC PANEL
Anion gap: 7 (ref 5–15)
BUN: 6 mg/dL (ref 6–20)
CO2: 28 mmol/L (ref 22–32)
Calcium: 8.9 mg/dL (ref 8.9–10.3)
Chloride: 104 mmol/L (ref 98–111)
Creatinine, Ser: 1.23 mg/dL (ref 0.61–1.24)
GFR calc Af Amer: >60 mL/min (ref >60)
GFR calc non Af Amer: >60 mL/min (ref >60)
Glucose, Bld: 136 mg/dL — ABNORMAL HIGH (ref 70–99)
Potassium: 3.4 mmol/L — ABNORMAL LOW (ref 3.5–5.1)
Sodium: 139 mmol/L (ref 135–145)

## 2019-06-10 LAB — CBC
HCT: 42.9 % (ref 39.0–52.0)
Hemoglobin: 14.4 g/dL (ref 13.0–17.0)
MCH: 31.6 pg (ref 26.0–34.0)
MCHC: 33.6 g/dL (ref 30.0–36.0)
MCV: 94.1 fL (ref 80.0–100.0)
Platelets: 259 10*3/uL (ref 150–400)
RBC: 4.56 MIL/uL (ref 4.22–5.81)
RDW: 12.7 % (ref 11.5–15.5)
WBC: 6 10*3/uL (ref 4.0–10.5)
nRBC: 0 % (ref 0.0–0.2)

## 2019-06-10 MED ORDER — AMLODIPINE BESYLATE 5 MG PO TABS
5.0000 mg | ORAL_TABLET | Freq: Every day | ORAL | Status: DC
  Administered 2019-06-10 – 2019-06-12 (×3): 5 mg via ORAL
  Filled 2019-06-10 (×3): qty 1

## 2019-06-10 NOTE — Progress Notes (Signed)
Physical Therapy Treatment Patient Details Name: William Ellison MRN: [id number] DOB: 05/29/1960 Today's Date: 06/10/2019    History of Present Illness 59 y.o. male who was last in his normal state of health prior to bed on 05/26.  This morning, he was still doing okay but had some confusion, however he passed out around 2 PM and then subsequently was much worse having trouble with word finding. 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 Pt was outside the window for TPA. PMH includes HTN and erectile dysfunction.    PT Comments    Patient is making progress toward PT goals and tolerated session well. Pt presents with R side weakness, balance deficits, and impaired communication. Pt requires min A for gait training and multimodal cues for all mobility tasks. Continue to recommend CIR for further skilled PT services to maximize independence and safety with mobility.      Follow Up Recommendations  CIR;Supervision/Assistance - 24 hour     Equipment Recommendations  Rolling walker with 5" wheels;3in1 (PT)    Recommendations for Other Services       Precautions / Restrictions Precautions Precautions: Fall Restrictions Weight Bearing Restrictions: No    Mobility  Bed Mobility Overal bed mobility: Needs Assistance Bed Mobility: Supine to Sit     Supine to sit: Min guard;HOB elevated     General bed mobility comments: min guard for safety; use of rail and increased time/effort needed  Transfers Overall transfer level: Needs assistance Equipment used: None Transfers: Sit to/from Stand Sit to Stand: Min guard         General transfer comment: min guard for safety due to instability upon standing   Ambulation/Gait Ambulation/Gait assistance: Min assist Gait Distance (Feet): 150 Feet Assistive device: None Gait Pattern/deviations: Wide base of support;Step-through pattern;Decreased stride  length;Drifts right/left Gait velocity: reduced   General Gait Details: multimodal cues to navigate environment; nearly running into objects on R side in hallway; assistance for balance and increased gait deviations with horizontal head turns   Stairs             Wheelchair Mobility    Modified Rankin (Stroke Patients Only) Modified Rankin (Stroke Patients Only) Pre-Morbid Rankin Score: No symptoms Modified Rankin: Moderately severe disability     Balance Overall balance assessment: Needs assistance Sitting-balance support: Feet supported Sitting balance-Leahy Scale: Good     Standing balance support: No upper extremity supported;During functional activity Standing balance-Leahy Scale: Fair                              Cognition Arousal/Alertness: Awake/alert Behavior During Therapy: WFL for tasks assessed/performed Overall Cognitive Status: Impaired/Different from baseline                                 General Comments: multimodal cues required for mobility tasks; pt responding yes/no appropriately ~75% on the time; did not attempt to verbalize further and responded no when asked to state name      Exercises      General Comments        Pertinent Vitals/Pain Pain Assessment: Faces Faces Pain Scale: No hurt    Home Living                      Prior Function  PT Goals (current goals can now be found in the care plan section) Acute Rehab PT Goals Patient Stated Goal: Pt unable to state Progress towards PT goals: Progressing toward goals    Frequency    Min 4X/week      PT Plan Current plan remains appropriate    Co-evaluation              AM-PAC PT "6 Clicks" Mobility   Outcome Measure  Help needed turning from your back to your side while in a flat bed without using bedrails?: None Help needed moving from lying on your back to sitting on the side of a flat bed without using bedrails?:  None Help needed moving to and from a bed to a chair (including a wheelchair)?: A Little Help needed standing up from a chair using your arms (e.g., wheelchair or bedside chair)?: A Little Help needed to walk in hospital room?: A Little Help needed climbing 3-5 steps with a railing? : A Lot 6 Click Score: 19    End of Session   Activity Tolerance: Patient tolerated treatment well Patient left: in chair;with call bell/phone within reach;with family/visitor present;with nursing/sitter in room Nurse Communication: Mobility status PT Visit Diagnosis: Unsteadiness on feet (R26.81);Other abnormalities of gait and mobility (R26.89);Other symptoms and signs involving the nervous system (I14.431)     Time: 5400-8676 PT Time Calculation (min) (ACUTE ONLY): 26 min  Charges:  $Gait Training: 23-37 mins                     William Ellison, PTA Acute Rehabilitation Services Pager: [phone] Office: [phone]     William Ellison 06/10/2019, 11:30 AM

## 2019-06-10 NOTE — Progress Notes (Addendum)
Inpatient Rehabilitation Admissions Coordinator  I met with patient at bedside with his girlfriend, Colletta [place]. We discussed a possible CIR admit pending insurance approval when medical work up complete. Goals and expectations reviewed. She is in agreement. I will follow up tomorrow to begin insurance authorization.  Danne Baxter, RN, MSN Rehab Admissions Coordinator [phone] 06/10/2019 2:06 PM

## 2019-06-10 NOTE — Progress Notes (Signed)
STROKE TEAM PROGRESS NOTE   INTERVAL HISTORY No changes overnight, his male friend is still there, today again they are both sleeping; his friend in his bed and patient sitting in chair, he arouses easily.  Vitals:   06/09/19 2002 06/09/19 2330 06/10/19 0430 06/10/19 0818  BP: (!) 156/103 (!) 184/102 (!) 173/92 (!) 145/92  Pulse: 75 70 74 78  Resp: 18 17 19 19   Temp: 98.5 F (36.9 C) 99 F (37.2 C) 99.5 F (37.5 C) 98.3 F (36.8 C)  TempSrc: Oral Oral Oral Oral  SpO2: 95% 99% 99% 100%  Weight:      Height:        CBC:  Recent Labs  Lab 06/07/19 0112 06/10/19 0515  WBC 6.5 6.0  NEUTROABS 3.3  --   HGB 15.4 14.4  HCT 45.6 42.9  MCV 93.4 94.1  PLT 268 259    Basic Metabolic Panel:  Recent Labs  Lab 06/07/19 0112 06/10/19 0515  NA 139 139  K 4.0 3.4*  CL 103 104  CO2 26 28  GLUCOSE 106* 136*  BUN 13 6  CREATININE 1.28* 1.23  CALCIUM 9.2 8.9   Lipid Panel:     Component Value Date/Time   CHOL 198 06/07/2019 0653   TRIG 67 06/07/2019 0653   HDL 37 (L) 06/07/2019 0653   CHOLHDL 5.4 06/07/2019 0653   VLDL 13 06/07/2019 0653   LDLCALC 148 (H) 06/07/2019 0653   HgbA1c:  Lab Results  Component Value Date   HGBA1C 6.2 (H) 06/07/2019   Urine Drug Screen: No results found for: LABOPIA, COCAINSCRNUR, LABBENZ, AMPHETMU, THCU, LABBARB  Alcohol Level No results found for: ETH  IMAGING past 24 hours VAS 06/09/2019 LOWER EXTREMITY VENOUS (DVT)  Result Date: 06/09/2019  Lower Venous DVT Study Indications: Stroke.  Comparison Study: No prior study on file for comparison Performing Technologist: 06/11/2019 RVS  Examination Guidelines: A complete evaluation includes B-mode imaging, spectral Doppler, color Doppler, and power Doppler as needed of all accessible portions of each vessel. Bilateral testing is considered an integral part of a complete examination. Limited examinations for reoccurring indications may be performed as noted. The reflux portion of the exam is  performed with the patient in reverse Trendelenburg.  +---------+---------------+---------+-----------+----------+--------------+ RIGHT    CompressibilityPhasicitySpontaneityPropertiesThrombus Aging +---------+---------------+---------+-----------+----------+--------------+ CFV      Full           Yes      Yes                                 +---------+---------------+---------+-----------+----------+--------------+ SFJ      Full                                                        +---------+---------------+---------+-----------+----------+--------------+ FV Prox  Full                                                        +---------+---------------+---------+-----------+----------+--------------+ FV Mid   Full                                                        +---------+---------------+---------+-----------+----------+--------------+  FV DistalFull                                                        +---------+---------------+---------+-----------+----------+--------------+ PFV      Full                                                        +---------+---------------+---------+-----------+----------+--------------+ POP      Full           Yes      Yes                                 +---------+---------------+---------+-----------+----------+--------------+ PTV      Full                                                        +---------+---------------+---------+-----------+----------+--------------+ PERO     Full                                                        +---------+---------------+---------+-----------+----------+--------------+   +---------+---------------+---------+-----------+----------+--------------+ LEFT     CompressibilityPhasicitySpontaneityPropertiesThrombus Aging +---------+---------------+---------+-----------+----------+--------------+ CFV      Full           Yes      Yes                                  +---------+---------------+---------+-----------+----------+--------------+ SFJ      Full                                                        +---------+---------------+---------+-----------+----------+--------------+ FV Prox  Full                                                        +---------+---------------+---------+-----------+----------+--------------+ FV Mid   Full                                                        +---------+---------------+---------+-----------+----------+--------------+ FV DistalFull                                                        +---------+---------------+---------+-----------+----------+--------------+  PFV      Full                                                        +---------+---------------+---------+-----------+----------+--------------+ POP      Full           Yes      Yes                                 +---------+---------------+---------+-----------+----------+--------------+ PTV      Full                                                        +---------+---------------+---------+-----------+----------+--------------+ PERO     Full                                                        +---------+---------------+---------+-----------+----------+--------------+     Summary: BILATERAL: - No evidence of deep vein thrombosis seen in the lower extremities, bilaterally. -   *See table(s) above for measurements and observations.    Preliminary    PHYSICAL EXAM   Pleasant middle-aged obese African-American male sitting up in chair.  Not in distress. . Afebrile. Head is nontraumatic. Neck is supple without bruit.    Cardiac exam no murmur or gallop. Lungs are clear to auscultation. Distal pulses are well felt. Neurological Exam ;  Drowsy, sleeping in chair, but easily arousable. Significant aphasia, expressive>receptive.tried to state his name when I ask, Nonfluent speech with significant verbal  perseveration, can answer yes and no.  Follows only simple midline and one-step commands with visual cues.  Unable to name and repeat objects.[place] movements full without nystagmus.fundi were not visualized due to noncooperation. Blinks to threat bilaterally.Hearing normal to voice. Palatal movements are normal. Face asymmetric with right lower facial weakness.. Tongue midline.Normal strength, tone, reflexes and coordination. Normal sensation. Gait deferred.  ASSESSMENT/PLAN William Ellison is a 59 y.o. male with morbid obesity who woke with confusion then passed out. Later developed word finding difficulties. Out of tPA & IR window. Admitted to NICU to assess for further worsening.  NIH stroke scale 7  Stroke:   L MCA territory infarcts embolic secondary to unknown source vs large vessel disease given LVO L M1 occlusion   CT head multifocial L cerebral infarcts. ? L MCA hyperdensity. No hemorrhage. ASPECTS 6  CTA head & neck L M1 occlusion.   CT perfusion L MCA cord underestimated. Penumbra < 69mL (probably 20-52mL)  MRI  Scattered L MCA territory infarcts. abnormal distal L M1 and proximal M2 c/w occlusion.  2D Echo - EF 60 - 65%. No cardiac source of emboli identified.   Check LE doppler - neg DVT  TEE to look for embolic source. Will arrange with [facility] for next week - I sent email reminder / confirmation today.   If TEE negative, a [facility] electrophysiologist will consult and  consider placement of an implantable loop recorder to evaluate for atrial fibrillation as etiology of stroke. This has been explained to patient/family by Dr. Pearlean Brownie and Dr. Lucia Gaskins and they are agreeable. Patient will need rehabilitation, will stay for TEE and touch base with cardiology over the weekend.   LDL 148  HgbA1c 6.2  SCDs for VTE prophylaxis. Change to Lovenox 40 mg sq daily   No antithrombotic prior to admission, now on aspirin 300 mg  suppository daily. Add plavix and decrease aspirin to 81 mg daily x 3 months then aspirin alone given LVO.    Therapy recommendations: CIR   Disposition:  pending   Hypertensive Urgency  BP as high as 160/111  Home meds:  NONE - (not taking norvasc 5, benazepril-HCTZ 20-12.5 previously prescribed)  Stable . Permissive hypertension (OK if < 220/120) but gradually normalize in 5-7 days . Start Norvasc . Long-term BP goal normotensive  Hyperlipidemia  Home meds:  No statin  Add lipitor 80  LDL 148, goal < 70  Continue statin at discharge  Pre-Diabetes   HgbA1c 6.2, goal < 7.0  Other Stroke Risk Factors  ETOH use, advised to drink no more than 2 drink(s) a day  Morbid Obesity, Body mass index is 42.33 kg/m., recommend weight loss, diet and exercise as appropriate   Likely obstructive sleep apnea, needs OP evaluation  later   Other Active Problems  AKI 1.28->1.23  Hospital day # 3   Personally examined patient and images, and have participated in and made any corrections needed to history, physical, neuro exam,assessment and plan as stated above.  I have personally obtained the history, evaluated lab date, reviewed imaging studies and agree with radiology interpretations.   Naomie Dean, MD Stroke Neurology  A total of 15  minutes was spent for the care of this patient, spent on counseling patient and family on different diagnostic and therapeutic options, counseling and coordination of care, riskd ans benefits of management, compliance, or risk factor reduction and education.    To contact Stroke Continuity provider, please refer to [email or web address]. After hours, contact General Neurology

## 2019-06-11 DIAGNOSIS — R414 Neurologic neglect syndrome: Secondary | ICD-10-CM | POA: Diagnosis not present

## 2019-06-11 DIAGNOSIS — I639 Cerebral infarction, unspecified: Secondary | ICD-10-CM | POA: Diagnosis not present

## 2019-06-11 DIAGNOSIS — I69320 Aphasia following cerebral infarction: Secondary | ICD-10-CM | POA: Diagnosis not present

## 2019-06-11 DIAGNOSIS — Z713 Dietary counseling and surveillance: Secondary | ICD-10-CM | POA: Diagnosis not present

## 2019-06-11 DIAGNOSIS — R7303 Prediabetes: Secondary | ICD-10-CM | POA: Diagnosis not present

## 2019-06-11 DIAGNOSIS — I10 Essential (primary) hypertension: Secondary | ICD-10-CM | POA: Diagnosis not present

## 2019-06-11 DIAGNOSIS — Z6841 Body Mass Index (BMI) 40.0 and over, adult: Secondary | ICD-10-CM | POA: Diagnosis not present

## 2019-06-11 DIAGNOSIS — N529 Male erectile dysfunction, unspecified: Secondary | ICD-10-CM | POA: Diagnosis not present

## 2019-06-11 DIAGNOSIS — I69351 Hemiplegia and hemiparesis following cerebral infarction affecting right dominant side: Secondary | ICD-10-CM | POA: Diagnosis not present

## 2019-06-11 DIAGNOSIS — Z8249 Family history of ischemic heart disease and other diseases of the circulatory system: Secondary | ICD-10-CM | POA: Diagnosis not present

## 2019-06-11 DIAGNOSIS — I63412 Cerebral infarction due to embolism of left middle cerebral artery: Secondary | ICD-10-CM | POA: Diagnosis not present

## 2019-06-11 DIAGNOSIS — G4700 Insomnia, unspecified: Secondary | ICD-10-CM | POA: Diagnosis not present

## 2019-06-11 DIAGNOSIS — I63312 Cerebral infarction due to thrombosis of left middle cerebral artery: Secondary | ICD-10-CM | POA: Diagnosis not present

## 2019-06-11 DIAGNOSIS — Z20822 Contact with and (suspected) exposure to covid-19: Secondary | ICD-10-CM | POA: Diagnosis not present

## 2019-06-11 DIAGNOSIS — Z7289 Other problems related to lifestyle: Secondary | ICD-10-CM | POA: Diagnosis not present

## 2019-06-11 DIAGNOSIS — E876 Hypokalemia: Secondary | ICD-10-CM | POA: Diagnosis not present

## 2019-06-11 DIAGNOSIS — E785 Hyperlipidemia, unspecified: Secondary | ICD-10-CM | POA: Diagnosis not present

## 2019-06-11 DIAGNOSIS — Z79899 Other long term (current) drug therapy: Secondary | ICD-10-CM | POA: Diagnosis not present

## 2019-06-11 NOTE — Progress Notes (Signed)
Physical Therapy Treatment Patient Details Name: William Ellison MRN: [id number] DOB: [birth year] Today's Date: 06/11/2019    History of Present Illness 59 y.o. male who was last in his normal state of health prior to bed on 05/26.  This morning, he was still doing okay but had some confusion, however he passed out around 2 PM and then subsequently was much worse having trouble with word finding. 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 Pt was outside the window for TPA. PMH includes HTN and erectile dysfunction.    PT Comments    Patient continues to present with R side weakness, inattention, and noted at times to run into objects on R side when navigating environment.  Pt unable to follow multimodal cues for some balance activities during session despite visual demonstration. Continue to recommend CIR for further skilled PT services to maximize independence and safety with mobility.    Follow Up Recommendations  CIR;Supervision/Assistance - 24 hour     Equipment Recommendations  3in1 (PT);Other (comment)(TBD next venue)    Recommendations for Other Services       Precautions / Restrictions Precautions Precautions: Fall Restrictions Weight Bearing Restrictions: No    Mobility  Bed Mobility               General bed mobility comments: pt OOB in chair upon arrival   Transfers Overall transfer level: Needs assistance Equipment used: None Transfers: Sit to/from Stand Sit to Stand: Min guard            Ambulation/Gait Ambulation/Gait assistance: Min assist;Min guard Gait Distance (Feet): 200 Feet Assistive device: None Gait Pattern/deviations: Wide base of support;Step-through pattern;Decreased stride length;Drifts right/left     General Gait Details: running into objects on R side at times; assist to steady when turning, stepping backwards, and directional changes   Stairs              Wheelchair Mobility    Modified Rankin (Stroke Patients Only) Modified Rankin (Stroke Patients Only) Pre-Morbid Rankin Score: No symptoms Modified Rankin: Moderately severe disability     Balance Overall balance assessment: Needs assistance Sitting-balance support: Feet supported Sitting balance-Leahy Scale: Good     Standing balance support: No upper extremity supported;During functional activity Standing balance-Leahy Scale: Fair                              Cognition Arousal/Alertness: Awake/alert Behavior During Therapy: WFL for tasks assessed/performed Overall Cognitive Status: Impaired/Different from baseline Area of Impairment: Following commands;Safety/judgement;Problem solving                       Following Commands: Follows one step commands inconsistently Safety/Judgement: Decreased awareness of safety;Decreased awareness of deficits   Problem Solving: Requires verbal cues;Requires tactile cues;Difficulty sequencing General Comments: pt unable to complete most balance activities during session despite multimodal cues       Exercises      General Comments        Pertinent Vitals/Pain Pain Assessment: Faces Faces Pain Scale: No hurt    Home Living   Living Arrangements: Spouse/significant other Available Help at Discharge: Family;Available 24 hours/day(Stephanie unemployed)           Additional Comments: Judeth Cornfield verified info    Prior Function            PT Goals (current goals can now be found in  the care plan section) Progress towards PT goals: Progressing toward goals    Frequency    Min 4X/week      PT Plan Current plan remains appropriate    Co-evaluation              AM-PAC PT "6 Clicks" Mobility   Outcome Measure  Help needed turning from your back to your side while in a flat bed without using bedrails?: None Help needed moving from lying on your back to sitting on the side of a flat  bed without using bedrails?: None Help needed moving to and from a bed to a chair (including a wheelchair)?: A Little Help needed standing up from a chair using your arms (e.g., wheelchair or bedside chair)?: A Little Help needed to walk in hospital room?: A Little Help needed climbing 3-5 steps with a railing? : A Little 6 Click Score: 20    End of Session   Activity Tolerance: Patient tolerated treatment well Patient left: in chair;with call bell/phone within reach;with family/visitor present;with chair alarm set Nurse Communication: Mobility status PT Visit Diagnosis: Unsteadiness on feet (R26.81);Other abnormalities of gait and mobility (R26.89);Other symptoms and signs involving the nervous system (R29.898)     Time: 1529-1600 PT Time Calculation (min) (ACUTE ONLY): 31 min  Charges:  $Gait Training: 23-37 mins                     Earney Navy, PTA Acute Rehabilitation Services Pager: [phone] Office: [phone]     Darliss Cheney 06/11/2019, 4:57 PM

## 2019-06-11 NOTE — Progress Notes (Signed)
  Speech Language Pathology Treatment: Cognitive-Linquistic  Patient Details Name: William Ellison MRN: [id number] DOB: 1960/12/01 Today's Date: 06/11/2019 Time: 3734-2876 SLP Time Calculation (min) (ACUTE ONLY): 13 min  Assessment / Plan / Recommendation Clinical Impression  Pt presented with improved expression today.  Girlfriend present for session.  He required constant cues to look at clinician and focus on speech tasks.  With visual/tactile/verbal cues, William Ellison was able to count from 1-3, then 1-10 with occasional perseveratory responses.  He demonstrated improved recognition of errors, shaking his head and saying "no."  He demonstrated intermittent ability to repeat single words with 30% accuracy (improvement since 5/30), and showed promise for melodic-intonation-influenced treatment by approximating "how are you" when given a model sung in a simple melody.  He sang along with "Got to be Real" when played on his girlfriend's phone, producing the melody and demonstrating successful production of the final words of each phrase.  Pt will benefit from use of transparent masks so that he can watch staff's lip movement to help self-cue. He was more engaged today, laughing and showing frustration under appropriate conditions.  He will definitely benefit from the intensive speech/language therapy available at CIR.  SLP will follow.   HPI HPI: 59 y.o. male with hx HTN was last in his normal state of health prior to going to bed on 05/26.  The following morning, he was still doing okay but had some confusion, however he passed out around 2 PM and then subsequently was much worse having trouble with word finding. 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 Pt was outside the window for TPA.       SLP Plan  Continue with current plan of care       Recommendations                   Oral Care Recommendations: Oral care  BID Follow up Recommendations: Inpatient Rehab SLP Visit Diagnosis: Aphasia (R47.01) Plan: Continue with current plan of care       GO                William Ellison 06/11/2019, 12:48 PM  William Ellison L. William Frederic, MA CCC/SLP Acute Rehabilitation Services Office number [phone] Pager [phone]

## 2019-06-11 NOTE — Progress Notes (Signed)
STROKE TEAM PROGRESS NOTE   INTERVAL HISTORY RN and RT in room to assist pt in bathroom. Neuro stable, no acute event overnight, pending TEE and loop tomorrow.    Vitals:   06/11/19 0005 06/11/19 0341 06/11/19 0800 06/11/19 1200  BP: (!) 171/90 (!) 156/97 (!) 153/85 (!) 159/96  Pulse: 76 75 79 79  Resp: 18 20  18   Temp: 98.9 F (37.2 C) 99.1 F (37.3 C) 98.2 F (36.8 C) 98.5 F (36.9 C)  TempSrc: Oral Oral Oral Oral  SpO2: 100% 99% 100% 96%  Weight:      Height:       CBC:  Recent Labs  Lab 06/07/19 0112 06/10/19 0515  WBC 6.5 6.0  NEUTROABS 3.3  --   HGB 15.4 14.4  HCT 45.6 42.9  MCV 93.4 94.1  PLT 268 259   Basic Metabolic Panel:  Recent Labs  Lab 06/07/19 0112 06/10/19 0515  NA 139 139  K 4.0 3.4*  CL 103 104  CO2 26 28  GLUCOSE 106* 136*  BUN 13 6  CREATININE 1.28* 1.23  CALCIUM 9.2 8.9   Lipid Panel:     Component Value Date/Time   CHOL 198 06/07/2019 0653   TRIG 67 06/07/2019 0653   HDL 37 (L) 06/07/2019 0653   CHOLHDL 5.4 06/07/2019 0653   VLDL 13 06/07/2019 0653   LDLCALC 148 (H) 06/07/2019 0653   HgbA1c:  Lab Results  Component Value Date   HGBA1C 6.2 (H) 06/07/2019   Urine Drug Screen: No results found for: LABOPIA, COCAINSCRNUR, LABBENZ, AMPHETMU, THCU, LABBARB  Alcohol Level No results found for: ETH  IMAGING past 24 hours No results found.  PHYSICAL EXAM  Pleasant middle-aged obese African-American male sitting up in commode.   Not in distress. . Afebrile. Head is nontraumatic. Neck is supple without bruit.    Cardiac exam no murmur or gallop. Lungs are clear to auscultation. Distal pulses are well felt.  Neurological Exam ;  Awake alert, global aphasia, expressive>receptive. Nonfluent speech with significant verbal perseveration, can answer yes and no.  Follows only simple midline and one-step commands with visual cues.  Unable to name and repeat objects. Eye movements full without nystagmus.fundi were not visualized due to  noncooperation. Blinks to threat bilaterally.Hearing normal to voice. Palatal movements are normal. Face asymmetric with right lower facial weakness. Tongue midline.Normal strength, tone, reflexes and coordination. Normal sensation. Gait deferred.  ASSESSMENT/PLAN William Ellison is a 59 y.o. male with morbid obesity who woke with confusion then passed out. Later developed word finding difficulties. Out of tPA & IR window. Admitted to NICU to assess for further worsening. NIH stroke scale 7  Stroke:   L MCA territory infarcts embolic secondary to unknown source vs large vessel disease given LVO L M1 occlusion   CT head multifocial L cerebral infarcts. ? L MCA hyperdensity. No hemorrhage. ASPECTS 6  CTA head & neck L M1 occlusion.   CT perfusion L MCA cord underestimated. Penumbra < 69mL (probably 20-30mL)  MRI  Scattered L MCA territory infarcts. abnormal distal L M1 and proximal M2 c/w occlusion.  2D Echo - EF 60 - 65%. No cardiac source of emboli identified.   LE doppler - neg DVT  TEE to look for embolic source. Arranged with [facility] for Wednesday.   If TEE negative, a [facility] electrophysiologist will consult and consider placement of an implantable loop recorder to evaluate for atrial fibrillation as etiology of  stroke prior to d/c to rehab  LDL 148  HgbA1c 6.2  lovenox for VTE prophylaxis  No antithrombotic prior to admission, now on aspirin 325 and plavix 75 daily x 3 months then aspirin alone given LVO.    Therapy recommendations: CIR   Disposition:  pending - anticipate d/c to CIR following TEE/loop 6/2  Hypertensive Urgency  BP as high as 160/111  Home meds:  NONE - (not taking norvasc 5, benazepril-HCTZ 20-12.5 previously prescribed) . Gradually normalize in 2-3 days . Added Norvasc 5 . SBP 150s . Long-term BP goal normotensive  Hyperlipidemia  Home meds:  No statin  Lipitor 80  LDL 148, goal <  70  Continue statin at discharge  Pre-Diabetes   HgbA1c 6.2, goal < 7.0  CBG monitoring  SSI  PCP follow up  Other Stroke Risk Factors  ETOH use, advised to drink no more than 2 drink(s) a day  Morbid Obesity, Body mass index is 42.33 kg/m., recommend weight loss, diet and exercise as appropriate   Likely obstructive sleep apnea, needs OP evaluation  later   Other Active Problems  AKI 1.28->1.23  Hospital day # 4   Rosalin Hawking, MD PhD Stroke Neurology 06/12/2019 12:15 AM     To contact Stroke Continuity provider, please refer to [email or web address] After hours, contact General Neurology

## 2019-06-11 NOTE — PMR Pre-admission (Addendum)
PMR Admission Coordinator Pre-Admission Assessment  Patient: William Ellison is an 59 y.o., male MRN: [id number] DOB: [birth year] Height: 5\' 10"  (177.8 cm) Weight: 133.8 kg              Insurance Information HMO:     PPO: yes     PCP:      IPA:      80/20:      OTHER:  PRIMARY: [employer] [place]      Policy#:      Subscriber: pt CM Name: [id number]     Phone#: [phone]     Fax#: [phone] Pre-Cert#: [id number]  Updates due 06/25/2019 Employer: 06/27/2019 Benefits:  Phone #: [phone]     Name: 6/1 Eff. Date: 01/11/2019     Deduct: $3000      Out of Pocket Max: $4500 includes deductible      Life Max: none  CIR: 80%      SNF: 80% 60 days per year Outpatient: 80%     Co-Pay: 30 visits combined PT And OT, 30 visits SLP Home Health: 80%      Co-Pay: visits limited per medical neccesity DME: 80%     Co-Pay: 20% Providers: in network  SECONDARY: none      Financial Counselor:       Phone#:   The 03/11/2019" for patients in Inpatient Rehabilitation Facilities with attached "Privacy Act Statement-Health Care Records" was provided and verbally reviewed with: N/A  Emergency Contact Information Contact Information     Name Relation Home Work Mobile   smith,stephanie Significant other [phone]  [phone]      Current Medical History  Patient Admitting Diagnosis: CVA  History of Present Illness:   William Ellison is a 59 y.o. right-handed male with history of hypertension.  History taken from chart review and wife due to cognition. Patient lives with spouse.  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  He presents on 06/07/19 with AMS and word finding difficulties. CT/MRI showed scattered foci of acute subacute nonhemorrhagic left MCA territory, predominantly posterior infarct.  Minimal involvement of the precentral gyrus.  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  Admission  chemistries with creatinine 1.28, ammonia level 56, urinalysis negative nitrite, hemoglobin A1c 6.2.  Patient did not receive TPA.  Echocardiogram with EF on 65% with no wall motion abnormalities.  Neurology consulted, plan for TEE, work-up ongoing presently on aspirin and Plavix for CVA prophylaxis.  Subcutaneous Lovenox for DVT prophylaxis.  Bilateral lower extremity Dopplers negative.   TEE 6/2 with LVEF >55%, trivial MR/TR, no LA/LAA thrombus or mass. No ASD/PFO. Plans for LOOP placement today.  Complete NIHSS TOTAL: 6 Glasgow Coma Scale Score: 14  Past Medical History  Past Medical History:  Diagnosis Date   Erectile dysfunction    Hypertension    Skin rash     Family History  family history includes Hypertension in his mother.  Prior Rehab/Hospitalizations:  Has the patient had prior rehab or hospitalizations prior to admission? Yes  Has the patient had major surgery during 100 days prior to admission? No  Current Medications   Current Facility-Administered Medications:    [MAR Hold]  stroke: mapping our early stages of recovery book, , Does not apply, Once, Biby, Sharon L, NP   0.9 %  sodium chloride infusion, , Intravenous, Continuous, Biby, Sharon L, NP, Last Rate: 75 mL/hr at 06/12/19 0127, New Bag at 06/12/19 0127  0.9 %  sodium chloride infusion, , Intravenous, Continuous, Kroeger, Krista M., PA-C   [MAR Hold] acetaminophen (TYLENOL) tablet 650 mg, 650 mg, Oral, Q4H PRN **OR** [MAR Hold] acetaminophen (TYLENOL) 160 MG/5ML solution 650 mg, 650 mg, Per Tube, Q4H PRN **OR** [MAR Hold] acetaminophen (TYLENOL) suppository 650 mg, 650 mg, Rectal, Q4H PRN, Biby, Sharon L, NP   [MAR Hold] amLODipine (NORVASC) tablet 5 mg, 5 mg, Oral, Daily, Naomie Dean B, MD, 5 mg at 06/12/19 0921   [MAR Hold] aspirin EC tablet 325 mg, 325 mg, Oral, Daily, Marvel Plan, MD, 325 mg at 06/12/19 0920   [MAR Hold] atorvastatin (LIPITOR) tablet 80 mg, 80 mg, Oral, Daily, Biby, Sharon L, NP, 80 mg at  06/12/19 5397   butamben-tetracaine-benzocaine (CETACAINE) spray, , , PRN, Chilton Si, MD, 1 spray at 06/12/19 1222   [MAR Hold] clopidogrel (PLAVIX) tablet 75 mg, 75 mg, Oral, Daily, Biby, Sharon L, NP, 75 mg at 06/12/19 0921   [MAR Hold] enoxaparin (LOVENOX) injection 65 mg, 0.5 mg/kg, Subcutaneous, Q24H, Biby, Sharon L, NP, 65 mg at 06/12/19 6734   [facility] Hold] sodium chloride 0.9 % bolus 500 mL, 500 mL, Intravenous, Once, Amada Jupiter, Hardin Negus, MD  Facility-Administered Medications Ordered in Other Encounters:    lidocaine (cardiac) 100 mg/13mL (XYLOCAINE) injection 2%, , Intravenous, Anesthesia Intra-op, Yolonda Kida, CRNA, 40 mg at 06/12/19 1224   propofol (DIPRIVAN) 10 mg/mL bolus/IV push, , Intravenous, Anesthesia Intra-op, Yolonda Kida, CRNA, 20 mg at 06/12/19 1233   propofol (DIPRIVAN) 500 MG/50ML infusion, , Intravenous, Continuous PRN, Yolonda Kida, CRNA, Stopped at 06/12/19 1242  Patients Current Diet:  Diet Order             Diet NPO time specified Except for: Sips with Meds  Diet effective midnight                Precautions / Restrictions Precautions Precautions: Fall Restrictions Weight Bearing Restrictions: No   Has the patient had 2 or more falls or a fall with injury in the past year?No  Prior Activity Level Community (5-7x/wk): Independent, driving , working at [employer], Barrister's clerk  Prior Functional Level Prior Function Level of Independence: Independent Comments: 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  Independent  Indoor Mobility: Did the patient need assistance with walking from room to room (with or without device)? Independent  Stairs: Did the patient need assistance with internal or external stairs (with or without device)? Independent  Functional Cognition: Did the patient need help planning regular tasks such as shopping or remembering to take  medications? Independent  Home Assistive Devices / Equipment Home Equipment: None  Prior Device Use: Indicate devices/aids used by the patient prior to current illness, exacerbation or injury? None of the above  Current Functional Level Cognition  Arousal/Alertness: Lethargic Overall Cognitive Status: Impaired/Different from baseline Difficult to assess due to: Impaired communication Current Attention Level: Sustained Orientation Level: Oriented to person Following Commands: Follows one step commands inconsistently Safety/Judgement: Decreased awareness of safety, Decreased awareness of deficits General Comments: pt unable to complete most balance activities during session despite multimodal cues  Attention: Focused, Sustained Focused Attention: Appears intact Sustained Attention: Impaired Sustained Attention Impairment: Verbal basic Memory: (not assessed due to extent of language impairment)    Extremity Assessment (includes Sensation/Coordination)  Upper Extremity Assessment: Overall WFL for tasks assessed  Lower Extremity Assessment: Defer to PT evaluation    ADLs  Overall ADL's : Needs assistance/impaired Eating/Feeding: Set up Grooming: Wash/dry hands, Standing, Oral care, Wash/dry face, Applying deodorant, Maximal assistance, Sitting, Cueing for sequencing, Cueing for safety Grooming Details (indicate cue type and reason): Requiring increased cues in order to incorporate RUE into tasks, increased apraxia and unintended movements noted with RUE, required increased mutimodal cues to incorporate, requiring HOH A to brush teeth and for appropriate sequencing of tasks, increased cues to attend to items on R Upper Body Bathing: Maximal assistance, Moderate assistance, Cueing for sequencing Upper Body Bathing Details (indicate cue type and reason): Requires multi-modal cues to sequence, unable to complete independently as pt will start with upper body, then face and be done (increased  cueing needed for thoroughness) Lower Body Bathing: Moderate assistance, Cueing for safety, Cueing for sequencing, Sit to/from stand Lower Body Bathing Details (indicate cue type and reason): Perseverative on peri-care area, however demonstrated awareness of dirty rag, required cues to wet washcloth as pt attempted 2x with dry wash cloth and requiring tactile cues to wet washcloth, pt using solely L hand, and when prompted with R would switch washcloth back to L Upper Body Dressing : Moderate assistance Upper Body Dressing Details (indicate cue type and reason): Able to thread L side, requiring assistance with R to find arm hole Lower Body Dressing: Moderate assistance Toilet Transfer: Minimal assistance, Cueing for safety, Cueing for sequencing Toilet Transfer Details (indicate cue type and reason): Simulated with sit<>stands; continues to require multi-modal cues for sequencing Toileting- Clothing Manipulation and Hygiene: Minimal assistance Functional mobility during ADLs: Moderate assistance, Maximal assistance, Cueing for safety, Cueing for sequencing General ADL Comments: Noted to required increased cues for sequencing in basic ADLs, as well as incorporation of RUE into tasks, minimally impulsive in movement when completing functional mobility    Mobility  Overal bed mobility: Needs Assistance Bed Mobility: Supine to Sit Supine to sit: Min guard General bed mobility comments: pt OOB in chair upon arrival     Transfers  Overall transfer level: Needs assistance Equipment used: None Transfers: Sit to/from Stand Sit to Stand: Min guard General transfer comment: min guard for safety due to instability upon standing and impulsivity    Ambulation / Gait / Stairs / Wheelchair Mobility  Ambulation/Gait Ambulation/Gait assistance: Min assist, Min guard Gait Distance (Feet): 200 Feet Assistive device: None Gait Pattern/deviations: Wide base of support, Step-through pattern, Decreased stride  length, Drifts right/left General Gait Details: running into objects on R side at times; assist to steady when turning, stepping backwards, and directional changes Gait velocity: reduced Gait velocity interpretation: <1.8 ft/sec, indicate of risk for recurrent falls    Posture / Balance Dynamic Sitting Balance Sitting balance - Comments: close supervision at the edge of bed Balance Overall balance assessment: Needs assistance Sitting-balance support: Feet supported Sitting balance-Leahy Scale: Good Sitting balance - Comments: close supervision at the edge of bed Standing balance support: No upper extremity supported, During functional activity Standing balance-Leahy Scale: Fair Standing balance comment: noted to fatigue quickly in standing, however able to address peri care area in standing with min gaurd for safety and stability    Special needs/care consideration  Hgb A1c 6.2 Designated visitor is girlfriend, Judeth Cornfield who stay 24/7 to assist due to patient's aphasia LOOP to be placed 6/2   Previous Home Environment  Living Arrangements: Spouse/significant other  Lives With: Significant other(Stephanie) Available Help at Discharge: Family, Available 24 hours/day(Stephanie unemployed) Type of Home: Apartment Home Layout: One level Home Access: Level entry Bathroom Shower/Tub: Tub/shower unit  Bathroom Toilet: Programmer, systems: Yes How Accessible: Accessible via walker Home Care Services: No Additional Comments: Colletta [place] verified info  Discharge Living Setting Plans for Discharge Living Setting: Lives with (comment), Apartment(Stephanie, significant other) Type of Home at Discharge: Apartment Discharge Home Layout: One level Discharge Home Access: Level entry Discharge Bathroom Shower/Tub: Tub/shower unit Discharge Bathroom Toilet: Standard Does the patient have any problems obtaining your medications?: No  Social/Family/Support Systems Patient Roles:  Parent(fulltime Dealer) Contact Information: Colletta [place] Anticipated Caregiver: Dispensing optician Information: see above Ability/Limitations of Caregiver: no limitations Caregiver Availability: 24/7 Discharge Plan Discussed with Primary Caregiver: Yes Is Caregiver In Agreement with Plan?: Yes Does Caregiver/Family have Issues with Lodging/Transportation while Pt is in Rehab?: No  Goals Patient/Family Goal for Rehab: supervision to Mod I with PT, OT, and SLP Expected length of stay: [facility] 10-14 days. Pt/Family Agrees to Admission and willing to participate: Yes Program Orientation Provided & Reviewed with Pt/Caregiver Including Roles  & Responsibilities: Yes Additional Information Needs: Colletta [place] has been staying with patient 24/7 due to his aphasia  deficits  Decrease burden of Care through IP rehab admission: n/a  Possible need for SNF placement upon discharge:not anticipated  Patient Condition: This patient's medical and functional status has changed since the consult dated: 06/10/2019 in which the Rehabilitation Physician determined and documented that the patient's condition is appropriate for intensive rehabilitative care in an inpatient rehabilitation facility. See "History of Present Illness" (above) for medical update. Functional changes are: overall min to mod assist. Patient's medical and functional status update has been discussed with the Rehabilitation physician and patient remains appropriate for inpatient rehabilitation. Will admit to inpatient rehab today.  Preadmission Screen Completed By:  Cleatrice Burke, RN, 06/12/2019 12:51 PM ______________________________________________________________________   Discussed status with Dr. Posey Pronto on 06/12/2019 at  1250 and received approval for admission today.  Admission Coordinator:  Cleatrice Burke, time 0539 Date 06/12/2019

## 2019-06-11 NOTE — Discharge Summary (Addendum)
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 Date of Exam: 06/12/2019 Medical Rec #:  [id number]       Height:       70.0 in Accession #:    [id number]      Weight:       295.0 lb Date of Birth:  [birth year]       BSA:          2.462 m Patient Age:    59 years        BP:           151/106 mmHg Patient Gender: M               HR:           84 bpm. Exam Location:  Inpatient Procedure: 2D Echo, Color Doppler and Cardiac Doppler Indications:     Stroke  History:  Patient has prior history of Echocardiogram examinations, most                  recent 06/07/2019. Risk Factors:Hypertension.  Sonographer:     Ross Ludwig RDCS (AE) Referring Phys:  [id number] Beatriz Stallion Diagnosing Phys: Chilton Si MD PROCEDURE: After discussion of the risks and benefits of a TEE, an informed consent was obtained from the patient. The transesophogeal probe was passed without difficulty through the esophogus of the patient. Local oropharyngeal anesthetic was provided with Cetacaine. Sedation performed by different physician. The patient was monitored while under deep sedation. Anesthestetic sedation was provided intravenously by Anesthesiology: 264.33mg  of Propofol, 40mg  of Lidocaine. Image quality was adequate. The patient's vital signs; including heart rate, blood pressure, and oxygen  saturation; remained stable throughout the procedure. The patient developed no complications during the procedure. IMPRESSIONS  1. Left ventricular ejection fraction, by estimation, is 60 to 65%. The left ventricle has normal function. The left ventricle has no regional wall motion abnormalities.  2. Right ventricular systolic function is normal. The right ventricular size is normal.  3. No left atrial/left atrial appendage thrombus was detected.  4. The mitral valve is normal in structure. Trivial mitral valve regurgitation. No evidence of mitral stenosis.  5. The aortic valve is normal in structure. Aortic valve regurgitation is not visualized. No aortic stenosis is present.  6. The inferior vena cava is normal in size with greater than 50% respiratory variability, suggesting right atrial pressure of 3 mmHg.  7. Agitated saline contrast bubble study was negative, with no evidence of any interatrial shunt. Conclusion(s)/Recommendation(s): Normal biventricular function without evidence of hemodynamically significant valvular heart disease. FINDINGS  Left Ventricle: Left ventricular ejection fraction, by estimation, is 60 to 65%. The left ventricle has normal function. The left ventricle has no regional wall motion abnormalities. The left ventricular internal cavity size was normal in size. There is  no left ventricular hypertrophy. Right Ventricle: The right ventricular size is normal. No increase in right ventricular wall thickness. Right ventricular systolic function is normal. Left Atrium: Left atrial size was normal in size. No left atrial/left atrial appendage thrombus was detected. Right Atrium: Right atrial size was normal in size. Pericardium: There is no evidence of pericardial effusion. Mitral Valve: The mitral valve is normal in structure. Normal mobility of the mitral valve leaflets. Trivial mitral valve regurgitation. No evidence of mitral valve stenosis. Tricuspid Valve: The tricuspid valve is normal  in structure. Tricuspid valve regurgitation is not demonstrated. No evidence of tricuspid stenosis. Aortic Valve: The aortic valve is normal in structure. Aortic valve regurgitation is not visualized. No aortic stenosis is present. Pulmonic Valve: The pulmonic valve was normal in structure. Pulmonic valve regurgitation is not visualized. No evidence of pulmonic stenosis. Aorta: The aortic root is normal in size and structure. Venous: The inferior vena cava is normal in size with greater than 50% respiratory variability, suggesting right atrial pressure of 3 mmHg. IAS/Shunts: No atrial level shunt detected by color flow Doppler. Agitated saline contrast was given intravenously to evaluate for intracardiac shunting. Agitated saline contrast bubble study was negative, with no evidence of any interatrial shunt. There  is no evidence of a patent foramen ovale. There is no evidence of an atrial septal defect. MD Electronically signed by Chilton Si MD Signature Date/Time: 06/12/2019/1:31:52 PM    Final    VAS 08/12/2019 LOWER EXTREMITY VENOUS (DVT)  Result Date: 06/11/2019  Lower Venous DVT Study Indications: Stroke.  Comparison  Study: No prior study on file for comparison Performing Technologist: Sherren Kerns RVS  Examination Guidelines: A complete evaluation includes B-mode imaging, spectral Doppler, color Doppler, and power Doppler as needed of all accessible portions of each vessel. Bilateral testing is considered an integral part of a complete examination. Limited examinations for reoccurring indications may be performed as noted. The reflux portion of the exam is performed with the patient in reverse Trendelenburg.  +---------+---------------+---------+-----------+----------+--------------+ RIGHT    CompressibilityPhasicitySpontaneityPropertiesThrombus Aging +---------+---------------+---------+-----------+----------+--------------+ CFV      Full           Yes      Yes                                  +---------+---------------+---------+-----------+----------+--------------+ SFJ      Full                                                        +---------+---------------+---------+-----------+----------+--------------+ FV Prox  Full                                                        +---------+---------------+---------+-----------+----------+--------------+ FV Mid   Full                                                        +---------+---------------+---------+-----------+----------+--------------+ FV DistalFull                                                        +---------+---------------+---------+-----------+----------+--------------+ PFV      Full                                                        +---------+---------------+---------+-----------+----------+--------------+ POP      Full           Yes      Yes                                 +---------+---------------+---------+-----------+----------+--------------+ PTV      Full                                                        +---------+---------------+---------+-----------+----------+--------------+ PERO     Full                                                        +---------+---------------+---------+-----------+----------+--------------+   +---------+---------------+---------+-----------+----------+--------------+  LEFT     CompressibilityPhasicitySpontaneityPropertiesThrombus Aging +---------+---------------+---------+-----------+----------+--------------+ CFV      Full           Yes      Yes                                 +---------+---------------+---------+-----------+----------+--------------+ SFJ      Full                                                        +---------+---------------+---------+-----------+----------+--------------+ FV Prox  Full                                                         +---------+---------------+---------+-----------+----------+--------------+ FV Mid   Full                                                        +---------+---------------+---------+-----------+----------+--------------+ FV DistalFull                                                        +---------+---------------+---------+-----------+----------+--------------+ PFV      Full                                                        +---------+---------------+---------+-----------+----------+--------------+ POP      Full           Yes      Yes                                 +---------+---------------+---------+-----------+----------+--------------+ PTV      Full                                                        +---------+---------------+---------+-----------+----------+--------------+ PERO     Full                                                        +---------+---------------+---------+-----------+----------+--------------+     Summary: BILATERAL: - No evidence of deep vein thrombosis seen in the lower extremities, bilaterally. -   *See table(s) above for measurements and observations. Electronically signed by Waverly Ferrari MD on 06/11/2019 at 6:13:38 PM.    Final  HISTORY OF PRESENT ILLNESS Ferdinand Revoir is a 59 y.o. male who was last in his normal state of health prior to bed on 05/26.  This morning, he was still doing okay but had some confusion, however he passed out around 2 PM and then subsequently was much worse having trouble with word finding.  He was finally brought into the emergency department this evening, and was outside the 24-hour window, so was not a candidate for tPA or mechanical thrombectomy. Modified Rankin Scale: 0. NIHSS: 8. He was admitted for further stroke workup and evaluation.   HOSPITAL COURSE Mr. Miriam Kestler is a 59 y.o. male with morbid obesity who woke with confusion then passed out. Later developed word  finding difficulties. Out of tPA & IR window. Admitted to NICU to assess for further worsening. NIH stroke scale 7  Stroke:   L MCA territory infarcts embolic secondary to unknown source vs large vessel disease given LVO L M1 occlusion   CT head multifocial L cerebral infarcts. ? L MCA hyperdensity. No hemorrhage. ASPECTS 6  CTA head & neck L M1 occlusion.   CT perfusion L MCA cord underestimated. Penumbra < 39mL (probably 20-59mL)  MRI  Scattered L MCA territory infarcts. abnormal distal L M1 and proximal M2 c/w occlusion.  2D Echo - EF 60 - 65%. No cardiac source of emboli identified.   LE doppler - neg DVT  TEE neg for embolic source  implantable loop recorder to evaluate for atrial fibrillation as etiology of stroke placed 6/2 ([place])   LDL 148  HgbA1c 6.2  lovenox for VTE prophylaxis  No antithrombotic prior to admission, now on aspirin 325 and plavix 75 daily x 3 months then aspirin alone given LVO.    Therapy recommendations: CIR   Disposition:  CIR   Hypertensive Urgency  BP as high as 160/111  Home meds:  NONE - (not taking norvasc 5, benazepril-HCTZ 20-12.5 previously prescribed)  Gradually normalize in 2-3 days  Added Norvasc 5  SBP 140-170s  Long-term BP goal normotensive  Hyperlipidemia  Home meds:  No statin  Lipitor 80  LDL 148, goal < 70  Continue statin at discharge  Pre-Diabetes   HgbA1c 6.2, goal < 7.0  CBG monitoring  SSI  PCP follow up  Other Stroke Risk Factors  ETOH use, advised to drink no more than 2 drink(s) a day  Morbid Obesity, Body mass index is 42.33 kg/m., recommend weight loss, diet and exercise as appropriate   Likely obstructive sleep apnea, needs OP evaluation  later   Other Active Problems  AKI 1.28->1.23   DISCHARGE EXAM Blood pressure (!) 175/85, pulse 80, temperature 98.8 F (37.1 C), temperature source Axillary, resp. rate 14, height 5\' 10"  (1.778 m), weight 133.8 kg, SpO2 99  %.  Pleasant middle-aged obese African-American male sitting up in commode.   Not in distress. . Afebrile. Head is nontraumatic. Neck is supple without bruit.    Cardiac exam no murmur or gallop. Lungs are clear to auscultation. Distal pulses are well felt.  Neurological Exam ;  Awake alert, expressive aphasia, mild receptive aphasia. Nonfluent speech with significant verbal perseveration, can answer yes and no.  Follows most simple commands but with intermittent perseveration.  Unable to name and repeat. Eye movements full without nystagmus.fundi were not visualized due to noncooperation. Blinks to threat bilaterally. Face asymmetric with right lower facial weakness. Tongue midline. Right UE and LE 4/5 and LUE and LLE 5/5. Subjectively symmetric bilaterally sensation. Gait deferred.  Discharge  Diet  Heart healthy thin liquids  DISCHARGE PLAN  Disposition:  Transfer to [facility] for ongoing PT, OT and ST  aspirin 325 mg daily and clopidogrel 75 mg daily for secondary stroke prevention for 3 MONTHS then ASPIRIN alone.  Recommend ongoing stroke risk factor control by Primary Care Physician at time of discharge from inpatient rehabilitation.  Follow-up PCP Kallie Locks, FNP in 2 weeks following discharge from rehab.  Follow-up in [facility] in 4 weeks following discharge from rehab, office to schedule an appointment.  Follow-up [facility] for implantable loop recorder wound check in 10-14 days, office to schedule an appointment.  Loop recorder to be monitored by [facility] for atrial fibrillation as source of stroke. If found, they will notify patient.  35 minutes were spent preparing discharge.  Marvel Plan, MD PhD Stroke Neurology 06/12/2019 5:33 PM

## 2019-06-11 NOTE — Progress Notes (Signed)
    [facility] has been requested to perform a transesophageal echocardiogram on 06/12/19 for stroke.  After careful review of history and examination, and given ongoing aphasia, the risks and benefits of transesophageal echocardiogram have been explained to the patient's significant other, Claudette Laws, including risks of esophageal damage, perforation (1:10,000 risk), bleeding, pharyngeal hematoma as well as other potential complications associated with conscious sedation including aspiration, arrhythmia, respiratory failure and death. Alternatives to treatment were discussed, questions were answered. Ms. William Ellison is willing to proceed.   TEE scheduled for 06/12/19 at 8:30am with Dr. Duke Salvia.  Judy Pimple, PA-C 06/11/2019 6:39 PM

## 2019-06-11 NOTE — Progress Notes (Signed)
Occupational Therapy Treatment Patient Details Name: William Ellison MRN: [id number] DOB: Oct 04, 1960 Today's Date: 06/11/2019    History of present illness 59 y.o. male who was last in his normal state of health prior to bed on 05/26.  This morning, he was still doing okay but had some confusion, however he passed out around 2 PM and then subsequently was much worse having trouble with word finding. 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 Pt was outside the window for TPA. PMH includes HTN and erectile dysfunction.   OT comments  Patient continues to make steady progress towards goals in skilled OT session. Patient's session encompassed ADL tasks to further address deficits in cognition and incorporation of RUE into tasks. Pt noted to require increased cues in order to use R hand in session (pt is R hand dominant) however with cues will attempt to complete a bilateral task and successfully crossed midline (pt unable to complete task of buckling gait belt without mod A). Pt required multi-modal cues throughout session to adhere to one step commands, and is currently unable to follow multi-step commands. Pt also noted to be peseverative in completion of bathing at sink, washing peri-care area more than a half a dozen times in session, (pt would be directed to another body part with HOH A, and pt would deviate back to peri-care area). Pt currently unable to sequence basic rote tasks without moderate A. Pt would continue to greatly benefit from intensive rehab in order to address functional deficits aforementioned, as well as increased strategies for higher level cognition, sequencing, and limb motor apraxia in hopes to regain prior level of function; will continue to follow acutely.    Follow Up Recommendations  CIR    Equipment Recommendations  None recommended by OT    Recommendations for Other Services      Precautions /  Restrictions Precautions Precautions: Fall Restrictions Weight Bearing Restrictions: No       Mobility Bed Mobility Overal bed mobility: Needs Assistance Bed Mobility: Supine to Sit     Supine to sit: Min guard     General bed mobility comments: min guard for safety; use of rail and increased time/effort needed, max encouragement required to come to EOB  Transfers Overall transfer level: Needs assistance Equipment used: None Transfers: Sit to/from Stand Sit to Stand: Min guard         General transfer comment: min guard for safety due to instability upon standing and impulsivity    Balance Overall balance assessment: Needs assistance Sitting-balance support: Feet supported Sitting balance-Leahy Scale: Good Sitting balance - Comments: close supervision at the edge of bed   Standing balance support: No upper extremity supported;During functional activity Standing balance-Leahy Scale: Fair Standing balance comment: noted to fatigue quickly in standing, however able to address peri care area in standing with min gaurd for safety and stability                           ADL either performed or assessed with clinical judgement   ADL Overall ADL's : Needs assistance/impaired Eating/Feeding: Set up   Grooming: Wash/dry hands;Standing;Oral care;Wash/dry face;Applying deodorant;Maximal assistance;Sitting;Cueing for sequencing;Cueing for safety Grooming Details (indicate cue type and reason): Requiring increased cues in order to incorporate RUE into tasks, increased apraxia and unintended movements noted with RUE, required increased mutimodal cues to incorporate, requiring HOH A to brush teeth and for appropriate  sequencing of tasks, increased cues to attend to items on R Upper Body Bathing: Maximal assistance;Moderate assistance;Cueing for sequencing Upper Body Bathing Details (indicate cue type and reason): Requires multi-modal cues to sequence, unable to complete  independently as pt will start with upper body, then face and be done (increased cueing needed for thoroughness) Lower Body Bathing: Moderate assistance;Cueing for safety;Cueing for sequencing;Sit to/from stand Lower Body Bathing Details (indicate cue type and reason): Perseverative on peri-care area, however demonstrated awareness of dirty rag, required cues to wet washcloth as pt attempted 2x with dry wash cloth and requiring tactile cues to wet washcloth, pt using solely L hand, and when prompted with R would switch washcloth back to L Upper Body Dressing : Moderate assistance Upper Body Dressing Details (indicate cue type and reason): Able to thread L side, requiring assistance with R to find arm hole     Toilet Transfer: Minimal assistance;Cueing for safety;Cueing for sequencing Toilet Transfer Details (indicate cue type and reason): Simulated with sit<>stands; continues to require multi-modal cues for sequencing         Functional mobility during ADLs: Moderate assistance;Maximal assistance;Cueing for safety;Cueing for sequencing General ADL Comments: Noted to required increased cues for sequencing in basic ADLs, as well as incorporation of RUE into tasks, minimally impulsive in movement when completing functional mobility     Vision       Perception     Praxis      Cognition Arousal/Alertness: Awake/alert Behavior During Therapy: WFL for tasks assessed/performed Overall Cognitive Status: Impaired/Different from baseline Area of Impairment: Orientation;Attention;Memory;Following commands;Safety/judgement;Awareness;Problem solving                 Orientation Level: Disoriented to;Place;Time;Situation Current Attention Level: Sustained Memory: Decreased recall of precautions;Decreased short-term memory Following Commands: Follows one step commands inconsistently Safety/Judgement: Decreased awareness of safety;Decreased awareness of deficits Awareness: Emergent Problem  Solving: Slow processing;Requires verbal cues;Requires tactile cues;Difficulty sequencing General Comments: multi-modal cues continue to be required, unable to pick between the choice of 2 for orientation, able to respond with yes/no appropriately 85% of the time, attempted throughout session for pt to state name, pt is currently unable        Exercises     Shoulder Instructions       General Comments      Pertinent Vitals/ Pain       Pain Assessment: Faces Faces Pain Scale: No hurt  Home Living                                          Prior Functioning/Environment              Frequency  Min 3X/week        Progress Toward Goals  OT Goals(current goals can now be found in the care plan section)  Progress towards OT goals: Progressing toward goals  Acute Rehab OT Goals Patient Stated Goal: Pt unable to state OT Goal Formulation: Patient unable to participate in goal setting Time For Goal Achievement: 06/21/19 Potential to Achieve Goals: Good  Plan Discharge plan remains appropriate    Co-evaluation                 AM-PAC OT "6 Clicks" Daily Activity     Outcome Measure   Help from another person eating meals?: A Little Help from another person taking care of personal grooming?: A Lot Help from  another person toileting, which includes using toliet, bedpan, or urinal?: A Lot Help from another person bathing (including washing, rinsing, drying)?: A Lot Help from another person to put on and taking off regular upper body clothing?: A Little Help from another person to put on and taking off regular lower body clothing?: A Lot 6 Click Score: 14    End of Session Equipment Utilized During Treatment: Gait belt  OT Visit Diagnosis: Unsteadiness on feet (R26.81);Muscle weakness (generalized) (M62.81);Other symptoms and signs involving cognitive function;Cognitive communication deficit (R41.841)   Activity Tolerance Patient tolerated  treatment well   Patient Left in chair;with chair alarm set;with call bell/phone within reach   Nurse Communication Mobility status;Precautions        Time: 5427-0623 OT Time Calculation (min): 36 min  Charges: OT General Charges $OT Visit: 1 Visit OT Treatments $Self Care/Home Management : 23-37 mins  Pollyann [place]. [place], COTA/L Acute Rehabilitation Services [phone]   Cherlyn Cushing 06/11/2019, 10:42 AM

## 2019-06-12 ENCOUNTER — Encounter (HOSPITAL_COMMUNITY)
Admission: EM | Disposition: A | Discharge status: Rehabilitation Facility | Payer: Self-pay | Source: Home / Self Care | Attending: Neurology

## 2019-06-12 ENCOUNTER — Inpatient Hospital Stay (HOSPITAL_COMMUNITY)
Admission: RE | Admit: 2019-06-12 | Discharge: 2019-06-21 | DRG: 057 | Disposition: A | Discharge status: Home / Self Care | Payer: BC Managed Care – PPO | Source: Intra-hospital | Attending: Physical Medicine and Rehabilitation | Admitting: Physical Medicine and Rehabilitation

## 2019-06-12 ENCOUNTER — Inpatient Hospital Stay (HOSPITAL_COMMUNITY): Payer: BC Managed Care – PPO | Admitting: Certified Registered Nurse Anesthetist

## 2019-06-12 ENCOUNTER — Other Ambulatory Visit: Payer: Self-pay

## 2019-06-12 ENCOUNTER — Inpatient Hospital Stay (HOSPITAL_COMMUNITY): Payer: BC Managed Care – PPO

## 2019-06-12 ENCOUNTER — Encounter (HOSPITAL_COMMUNITY): Payer: Self-pay | Admitting: Neurology

## 2019-06-12 ENCOUNTER — Encounter (HOSPITAL_COMMUNITY): Payer: Self-pay | Admitting: Physical Medicine and Rehabilitation

## 2019-06-12 DIAGNOSIS — I639 Cerebral infarction, unspecified: Secondary | ICD-10-CM | POA: Diagnosis present

## 2019-06-12 DIAGNOSIS — G479 Sleep disorder, unspecified: Secondary | ICD-10-CM

## 2019-06-12 DIAGNOSIS — I10 Essential (primary) hypertension: Secondary | ICD-10-CM | POA: Diagnosis not present

## 2019-06-12 DIAGNOSIS — E785 Hyperlipidemia, unspecified: Secondary | ICD-10-CM | POA: Diagnosis present

## 2019-06-12 DIAGNOSIS — E876 Hypokalemia: Secondary | ICD-10-CM | POA: Diagnosis not present

## 2019-06-12 DIAGNOSIS — N529 Male erectile dysfunction, unspecified: Secondary | ICD-10-CM | POA: Diagnosis not present

## 2019-06-12 DIAGNOSIS — Z6841 Body Mass Index (BMI) 40.0 and over, adult: Secondary | ICD-10-CM | POA: Diagnosis not present

## 2019-06-12 DIAGNOSIS — Z7289 Other problems related to lifestyle: Secondary | ICD-10-CM

## 2019-06-12 DIAGNOSIS — R7303 Prediabetes: Secondary | ICD-10-CM | POA: Diagnosis not present

## 2019-06-12 DIAGNOSIS — Z20822 Contact with and (suspected) exposure to covid-19: Secondary | ICD-10-CM | POA: Diagnosis present

## 2019-06-12 DIAGNOSIS — Z8249 Family history of ischemic heart disease and other diseases of the circulatory system: Secondary | ICD-10-CM | POA: Diagnosis not present

## 2019-06-12 DIAGNOSIS — I69351 Hemiplegia and hemiparesis following cerebral infarction affecting right dominant side: Secondary | ICD-10-CM | POA: Diagnosis not present

## 2019-06-12 DIAGNOSIS — Z79899 Other long term (current) drug therapy: Secondary | ICD-10-CM

## 2019-06-12 DIAGNOSIS — R414 Neurologic neglect syndrome: Secondary | ICD-10-CM | POA: Diagnosis present

## 2019-06-12 DIAGNOSIS — G4700 Insomnia, unspecified: Secondary | ICD-10-CM | POA: Diagnosis not present

## 2019-06-12 DIAGNOSIS — Z713 Dietary counseling and surveillance: Secondary | ICD-10-CM

## 2019-06-12 DIAGNOSIS — R0989 Other specified symptoms and signs involving the circulatory and respiratory systems: Secondary | ICD-10-CM

## 2019-06-12 DIAGNOSIS — I63512 Cerebral infarction due to unspecified occlusion or stenosis of left middle cerebral artery: Secondary | ICD-10-CM | POA: Diagnosis not present

## 2019-06-12 DIAGNOSIS — I69320 Aphasia following cerebral infarction: Secondary | ICD-10-CM

## 2019-06-12 HISTORY — PX: TEE WITHOUT CARDIOVERSION: SHX5443

## 2019-06-12 HISTORY — PX: LOOP RECORDER INSERTION: EP1214

## 2019-06-12 HISTORY — PX: BUBBLE STUDY: SHX6837

## 2019-06-12 LAB — SARS CORONAVIRUS 2 BY RT PCR (HOSPITAL ORDER, PERFORMED IN ~~LOC~~ HOSPITAL LAB): SARS Coronavirus 2: NEGATIVE

## 2019-06-12 SURGERY — LOOP RECORDER INSERTION

## 2019-06-12 SURGERY — ECHOCARDIOGRAM, TRANSESOPHAGEAL
Anesthesia: Monitor Anesthesia Care

## 2019-06-12 MED ORDER — CLOPIDOGREL BISULFATE 75 MG PO TABS
75.0000 mg | ORAL_TABLET | Freq: Every day | ORAL | Status: DC
  Administered 2019-06-13 – 2019-06-21 (×9): 75 mg via ORAL
  Filled 2019-06-12 (×9): qty 1

## 2019-06-12 MED ORDER — CLOPIDOGREL BISULFATE 75 MG PO TABS: 75.0000 mg | ORAL_TABLET | Freq: Every day | ORAL | Status: DC

## 2019-06-12 MED ORDER — ENOXAPARIN SODIUM 80 MG/0.8ML ~~LOC~~ SOLN
66.0000 mg | Freq: Every day | SUBCUTANEOUS | Status: DC
  Administered 2019-06-13 – 2019-06-21 (×9): 65 mg via SUBCUTANEOUS
  Filled 2019-06-12 (×9): qty 0.65

## 2019-06-12 MED ORDER — DIPHENHYDRAMINE HCL 12.5 MG/5ML PO ELIX: 12.5000 mg | ORAL_SOLUTION | Freq: Four times a day (QID) | ORAL | Status: DC | PRN

## 2019-06-12 MED ORDER — ENOXAPARIN SODIUM 80 MG/0.8ML ~~LOC~~ SOLN: 0.5000 mg/kg | SUBCUTANEOUS | Status: DC

## 2019-06-12 MED ORDER — LIDOCAINE-EPINEPHRINE 1 %-1:100000 IJ SOLN
INTRAMUSCULAR | Status: AC
  Filled 2019-06-12: qty 1

## 2019-06-12 MED ORDER — AMLODIPINE BESYLATE 5 MG PO TABS
5.0000 mg | ORAL_TABLET | Freq: Every day | ORAL | Status: DC
  Administered 2019-06-13 – 2019-06-21 (×9): 5 mg via ORAL
  Filled 2019-06-12 (×9): qty 1

## 2019-06-12 MED ORDER — ASPIRIN EC 325 MG PO TBEC
325.0000 mg | DELAYED_RELEASE_TABLET | Freq: Every day | ORAL | Status: DC
  Administered 2019-06-12: 325 mg via ORAL
  Filled 2019-06-12: qty 1

## 2019-06-12 MED ORDER — LIDOCAINE-EPINEPHRINE 1 %-1:100000 IJ SOLN
INTRAMUSCULAR | Status: DC | PRN
  Administered 2019-06-12: 15 mL via INTRADERMAL

## 2019-06-12 MED ORDER — PROCHLORPERAZINE EDISYLATE 10 MG/2ML IJ SOLN: 5.0000 mg | Freq: Four times a day (QID) | INTRAMUSCULAR | Status: DC | PRN

## 2019-06-12 MED ORDER — ALUM & MAG HYDROXIDE-SIMETH 200-200-20 MG/5ML PO SUSP: 30.0000 mL | ORAL | Status: DC | PRN

## 2019-06-12 MED ORDER — ATORVASTATIN CALCIUM 80 MG PO TABS: 80.0000 mg | ORAL_TABLET | Freq: Every day | ORAL | Status: DC

## 2019-06-12 MED ORDER — FLEET ENEMA 7-19 GM/118ML RE ENEM: 1.0000 | ENEMA | Freq: Once | RECTAL | Status: DC | PRN

## 2019-06-12 MED ORDER — SODIUM CHLORIDE 0.9 % IV SOLN: 75.0000 mL | INTRAVENOUS | 0 refills | Status: DC

## 2019-06-12 MED ORDER — SODIUM CHLORIDE 0.9 % IV SOLN: INTRAVENOUS | Status: DC

## 2019-06-12 MED ORDER — ATORVASTATIN CALCIUM 80 MG PO TABS
80.0000 mg | ORAL_TABLET | Freq: Every day | ORAL | Status: DC
  Administered 2019-06-13 – 2019-06-21 (×9): 80 mg via ORAL
  Filled 2019-06-12 (×9): qty 1

## 2019-06-12 MED ORDER — PROPOFOL 10 MG/ML IV BOLUS
INTRAVENOUS | Status: DC | PRN
  Administered 2019-06-12 (×3): 20 mg via INTRAVENOUS

## 2019-06-12 MED ORDER — LIDOCAINE HCL (CARDIAC) PF 100 MG/5ML IV SOSY
PREFILLED_SYRINGE | INTRAVENOUS | Status: DC | PRN
  Administered 2019-06-12: 40 mg via INTRAVENOUS

## 2019-06-12 MED ORDER — PHENYLEPHRINE HCL-NACL 10-0.9 MG/250ML-% IV SOLN
INTRAVENOUS | Status: AC
  Filled 2019-06-12: qty 250

## 2019-06-12 MED ORDER — GUAIFENESIN-DM 100-10 MG/5ML PO SYRP: 5.0000 mL | ORAL_SOLUTION | Freq: Four times a day (QID) | ORAL | Status: DC | PRN

## 2019-06-12 MED ORDER — PROPOFOL 500 MG/50ML IV EMUL
INTRAVENOUS | Status: DC | PRN
  Administered 2019-06-12: 125 ug/kg/min via INTRAVENOUS

## 2019-06-12 MED ORDER — POLYETHYLENE GLYCOL 3350 17 G PO PACK
17.0000 g | PACK | Freq: Every day | ORAL | Status: DC | PRN
  Administered 2019-06-17: 17 g via ORAL
  Filled 2019-06-12 (×2): qty 1

## 2019-06-12 MED ORDER — BUTAMBEN-TETRACAINE-BENZOCAINE 2-2-14 % EX AERO
INHALATION_SPRAY | CUTANEOUS | Status: DC | PRN
  Administered 2019-06-12: 1 via TOPICAL

## 2019-06-12 MED ORDER — PROCHLORPERAZINE MALEATE 5 MG PO TABS: 5.0000 mg | ORAL_TABLET | Freq: Four times a day (QID) | ORAL | Status: DC | PRN

## 2019-06-12 MED ORDER — TRAZODONE HCL 50 MG PO TABS: 25.0000 mg | ORAL_TABLET | Freq: Every evening | ORAL | Status: DC | PRN

## 2019-06-12 MED ORDER — BISACODYL 10 MG RE SUPP: 10.0000 mg | Freq: Every day | RECTAL | Status: DC | PRN

## 2019-06-12 MED ORDER — PROPOFOL 500 MG/50ML IV EMUL
INTRAVENOUS | Status: AC
  Filled 2019-06-12: qty 200

## 2019-06-12 MED ORDER — ACETAMINOPHEN 325 MG PO TABS: 650.0000 mg | ORAL_TABLET | ORAL | Status: DC | PRN

## 2019-06-12 MED ORDER — PROCHLORPERAZINE 25 MG RE SUPP: 12.5000 mg | Freq: Four times a day (QID) | RECTAL | Status: DC | PRN

## 2019-06-12 MED ORDER — ASPIRIN EC 325 MG PO TBEC
325.0000 mg | DELAYED_RELEASE_TABLET | Freq: Every day | ORAL | Status: DC
  Administered 2019-06-13 – 2019-06-21 (×9): 325 mg via ORAL
  Filled 2019-06-12 (×9): qty 1

## 2019-06-12 MED ORDER — ACETAMINOPHEN 325 MG PO TABS: 325.0000 mg | ORAL_TABLET | ORAL | Status: DC | PRN

## 2019-06-12 MED ORDER — ASPIRIN 325 MG PO TBEC: 325.0000 mg | DELAYED_RELEASE_TABLET | Freq: Every day | ORAL | 0 refills | Status: DC

## 2019-06-12 MED ORDER — POTASSIUM CHLORIDE CRYS ER 20 MEQ PO TBCR
30.0000 meq | EXTENDED_RELEASE_TABLET | Freq: Once | ORAL | Status: AC
  Administered 2019-06-12: 30 meq via ORAL
  Filled 2019-06-12: qty 1

## 2019-06-12 SURGICAL SUPPLY — 2 items
LOOP REVEAL LINQ LNQ11 (Prosthesis & Implant Heart) ×2 IMPLANT
PACK LOOP INSERTION (CUSTOM PROCEDURE TRAY) ×3 IMPLANT

## 2019-06-12 NOTE — Progress Notes (Signed)
William Fennel, MD  Physician  Physical Medicine and Rehabilitation  Consult Note      Signed  Date of Service:  06/10/2019  5:24 AM      Related encounter: ED to Hosp-Admission (Current) from 06/07/2019 in [facility]      Signed      Expand AllCollapse All   Show:Clear all [x] Manual[x] Template[] Copied  Added by: [x] Angiulli, , PA-C[x] , MD  [] Hover for details          Physical Medicine and Rehabilitation Consult Reason for Consult: Decreased functional ability with word finding difficulty Referring Physician: Dr. Mcarthur Rossetti   HPI: William Ellison is a 59 y.o. right-handed male with history of hypertension.  History taken from chart review and wife due to cognition. Patient lives with spouse.  Independent prior to admission working as a [employer].  He presents on 06/07/19 with AMS and word finding difficulties. CT/MRI showed scattered foci of acute subacute nonhemorrhagic left MCA territory, predominantly posterior infarct.  Minimal involvement of the precentral gyrus.  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  Admission chemistries with creatinine 1.28, ammonia level 56, urinalysis negative nitrite, hemoglobin A1c 6.2.  Patient did not receive TPA.  Echocardiogram with EF on 65% with no wall motion abnormalities.  Neurology consulted, plan for TEE, work-up ongoing presently on aspirin and Plavix for CVA prophylaxis.  Subcutaneous Lovenox for DVT prophylaxis.  Bilateral lower extremity Dopplers negative.  Therapy evaluation completed with recommendations of physical medicine rehab consult.   Review of Systems  Unable to perform ROS: Mental acuity        Past Medical History:  Diagnosis Date  . Erectile dysfunction    . Hypertension    . Skin rash      No past surgical history on file.      Family History  Problem Relation Age of Onset  . Hypertension Mother       Social History:  reports that he has never smoked. He has never used smokeless tobacco. He reports current alcohol use. He reports that he does not use drugs. Allergies: No Known Allergies       Medications Prior to Admission  Medication Sig Dispense Refill  . amLODipine (NORVASC) 5 MG tablet Take 1 tablet (5 mg total) by mouth daily. (Patient not taking: Reported on 06/07/2019) 30 tablet 0  . benazepril-hydrochlorthiazide (LOTENSIN HCT) 20-12.5 MG tablet Take 1 tablet by mouth daily. (Patient not taking: Reported on 06/07/2019) 30 tablet 3  . hydrocortisone cream 1 % Apply 1 application topically 2 (two) times daily. (Patient not taking: Reported on 06/07/2019) 30 g 3  . naproxen (NAPROSYN) 500 MG tablet Take 1 tablet twice daily as needed for pain. (Patient not taking: Reported on 06/25/2018) 15 tablet 0  . tadalafil (CIALIS) 10 MG tablet Take 1 tablet (10 mg total) by mouth daily as needed for erectile dysfunction. (Patient not taking: Reported on 06/07/2019) 10 tablet 0      Home: Home Living Family/patient expects to be discharged to:: Private residence Living Arrangements: Spouse/significant other Available Help at Discharge: Family, Available PRN/intermittently Type of Home: Apartment Home Access: Level entry Home Layout: One level Bathroom Shower/Tub: 06/27/2018: Standard Home Equipment: None Additional Comments: pt is unreliable historian due to significant expressive and receptive aphasias  Lives With: Spouse  Functional History: Prior Function Level of Independence: Independent Comments: pt reports working as a 06/09/2019 Status:  Mobility: Bed Mobility Overal bed mobility: Needs Assistance Bed Mobility: Supine to Sit Supine to sit: Min assist General bed mobility comments: Required increased tactile cues in session, spreading legs to either side of the bed, had to physically move L leg in order to exit on R side Transfers Overall transfer  level: Needs assistance Equipment used: Rolling walker (2 wheeled), 1 person hand held assist Transfers: Sit to/from Stand Sit to Stand: Min guard General transfer comment: used walker initially, however pt with increased confusion and carrying the walker vs pushing it, pt required max verbal cues to slow gait speed, noted to improve when provided HHA of 1 Ambulation/Gait Ambulation/Gait assistance: Min assist Gait Distance (Feet): 20 Feet Assistive device: None Gait Pattern/deviations: Step-to pattern, Wide base of support General Gait Details: pt with short step to gait, increased lateral sway Gait velocity: reduced Gait velocity interpretation: <1.8 ft/sec, indicate of risk for recurrent falls   ADL: ADL Overall ADL's : Needs assistance/impaired Eating/Feeding: Minimal assistance, Sitting Grooming: Wash/dry hands, Minimal assistance, Standing, Oral care, Wash/dry face, Moderate assistance Grooming Details (indicate cue type and reason): Min A to wash hands, increased cues needed for teeth brushing (unable to manipulate appropriately without HOH A for tops and loading toothbrush) Upper Body Bathing: Moderate assistance Lower Body Bathing: Moderate assistance Upper Body Dressing : Moderate assistance Lower Body Dressing: Moderate assistance Toilet Transfer: Minimal assistance Toilet Transfer Details (indicate cue type and reason): Stated he needed to use bathroom, unable to void, requiring increased multi-modal cues to position self appropriately around toilet Toileting- Clothing Manipulation and Hygiene: Minimal assistance Functional mobility during ADLs: Minimal assistance General ADL Comments: Attempted use of RW in session, increased confusion and impulsivity, noted to be less impulsive with HHA   Cognition: Cognition Overall Cognitive Status: Impaired/Different from baseline Arousal/Alertness: Lethargic Orientation Level: Oriented to person Attention: Focused,  Sustained Focused Attention: Appears intact Sustained Attention: Impaired Sustained Attention Impairment: Verbal basic Memory: (not assessed due to extent of language impairment) Cognition Arousal/Alertness: Awake/alert Behavior During Therapy: WFL for tasks assessed/performed Overall Cognitive Status: Impaired/Different from baseline Area of Impairment: Orientation, Attention, Memory, Following commands, Safety/judgement, Awareness, Problem solving Orientation Level: Disoriented to, Place, Time, Situation Current Attention Level: Sustained Memory: Decreased recall of precautions, Decreased short-term memory Following Commands: Follows one step commands inconsistently Safety/Judgement: Decreased awareness of safety, Decreased awareness of deficits Awareness: Emergent Problem Solving: Slow processing, Requires verbal cues, Requires tactile cues, Difficulty sequencing General Comments: Unable to correctly pick between choice of two to date, only able to state yes or no inconsistently in session, able to follow basic commands with increased time at 25% accuracy   Blood pressure (!) 145/92, pulse 78, temperature 98.3 F (36.8 C), temperature source Oral, resp. rate 19, height 5\' 10"  (1.778 m), weight 133.8 kg, SpO2 100 %. Physical Exam  Vitals reviewed. Constitutional: He appears well-developed.  Obese  HENT:  Head: Normocephalic and atraumatic.  Eyes: Right eye exhibits no discharge. Left eye exhibits no discharge.  Keeps eyes closed  Neck: No tracheal deviation present. No thyromegaly present.  Respiratory: Effort normal. No stridor. No respiratory distress.  GI: Soft. He exhibits no distension.  Musculoskeletal:     Comments: No edema or tenderness in extremities  Neurological:  Somnolent Global aphasia Motor: Limited by ability to follow commands, moving left side freely No movement noted on right side Dysarthria Right neglect  Skin: Skin is warm and dry.  Psychiatric:   Unable to assess due to mentation  Lab Results Last 24 Hours  Results for orders placed or performed during the hospital encounter of 06/07/19 (from the past 24 hour(s))  CBC     Status: None    Collection Time: 06/10/19  5:15 AM  Result Value Ref Range    WBC 6.0 4.0 - 10.5 K/uL    RBC 4.56 4.22 - 5.81 MIL/uL    Hemoglobin 14.4 13.0 - 17.0 g/dL    HCT 72.6 20.3 - 55.9 %    MCV 94.1 80.0 - 100.0 fL    MCH 31.6 26.0 - 34.0 pg    MCHC 33.6 30.0 - 36.0 g/dL    RDW 74.1 63.8 - 45.3 %    Platelets 259 150 - 400 K/uL    nRBC 0.0 0.0 - 0.2 %  Basic metabolic panel     Status: Abnormal    Collection Time: 06/10/19  5:15 AM  Result Value Ref Range    Sodium 139 135 - 145 mmol/L    Potassium 3.4 (L) 3.5 - 5.1 mmol/L    Chloride 104 98 - 111 mmol/L    CO2 28 22 - 32 mmol/L    Glucose, Bld 136 (H) 70 - 99 mg/dL    BUN 6 6 - 20 mg/dL    Creatinine, Ser 6.46 0.61 - 1.24 mg/dL    Calcium 8.9 8.9 - 80.3 mg/dL    GFR calc non Af Amer >60 >60 mL/min    GFR calc Af Amer >60 >60 mL/min    Anion gap 7 5 - 15       Imaging Results (Last 48 hours)  VAS [place] LOWER EXTREMITY VENOUS (DVT)   Result Date: 06/09/2019  Lower Venous DVT Study Indications: Stroke.  Comparison Study: No prior study on file for comparison Performing Technologist: Sherren Kerns RVS  Examination Guidelines: A complete evaluation includes B-mode imaging, spectral Doppler, color Doppler, and power Doppler as needed of all accessible portions of each vessel. Bilateral testing is considered an integral part of a complete examination. Limited examinations for reoccurring indications may be performed as noted. The reflux portion of the exam is performed with the patient in reverse Trendelenburg.  +---------+---------------+---------+-----------+----------+--------------+ RIGHT    CompressibilityPhasicitySpontaneityPropertiesThrombus Aging +---------+---------------+---------+-----------+----------+--------------+ CFV       Full           Yes      Yes                                 +---------+---------------+---------+-----------+----------+--------------+ SFJ      Full                                                        +---------+---------------+---------+-----------+----------+--------------+ FV Prox  Full                                                        +---------+---------------+---------+-----------+----------+--------------+ FV Mid   Full                                                        +---------+---------------+---------+-----------+----------+--------------+  FV DistalFull                                                        +---------+---------------+---------+-----------+----------+--------------+ PFV      Full                                                        +---------+---------------+---------+-----------+----------+--------------+ POP      Full           Yes      Yes                                 +---------+---------------+---------+-----------+----------+--------------+ PTV      Full                                                        +---------+---------------+---------+-----------+----------+--------------+ PERO     Full                                                        +---------+---------------+---------+-----------+----------+--------------+   +---------+---------------+---------+-----------+----------+--------------+ LEFT     CompressibilityPhasicitySpontaneityPropertiesThrombus Aging +---------+---------------+---------+-----------+----------+--------------+ CFV      Full           Yes      Yes                                 +---------+---------------+---------+-----------+----------+--------------+ SFJ      Full                                                        +---------+---------------+---------+-----------+----------+--------------+ FV Prox  Full                                                         +---------+---------------+---------+-----------+----------+--------------+ FV Mid   Full                                                        +---------+---------------+---------+-----------+----------+--------------+ FV DistalFull                                                        +---------+---------------+---------+-----------+----------+--------------+   PFV      Full                                                        +---------+---------------+---------+-----------+----------+--------------+ POP      Full           Yes      Yes                                 +---------+---------------+---------+-----------+----------+--------------+ PTV      Full                                                        +---------+---------------+---------+-----------+----------+--------------+ PERO     Full                                                        +---------+---------------+---------+-----------+----------+--------------+     Summary: BILATERAL: - No evidence of deep vein thrombosis seen in the lower extremities, bilaterally. -   *See table(s) above for measurements and observations.    Preliminary        Assessment/Plan: Diagnosis: Left MCA territory infarcts Stroke: Continue secondary stroke prophylaxis and Risk Factor Modification listed below:   Antiplatelet therapy:   Blood Pressure Management:  Continue current medication with prn's with permisive HTN per primary team Statin Agent:   Prediabetes management:   Right sided hemiparesis: fit for orthosis to prevent contractures (resting hand splint for day, wrist cock up splint at night, PRAFO, etc) Labs independently reviewed.  Records reviewed and summated above.   1. Does the need for close, 24 hr/day medical supervision in concert with the patient's rehab needs make it unreasonable for this patient to be served in a less intensive setting? Yes 2. Co-Morbidities requiring  supervision/potential complications: HTN (monitor and provide prns in accordance with increased physical exertion and pain), Prediabetes (Monitor in accordance with exercise and adjust meds as necessary), hypokalemia (continue to monitor and replete as necessary) 3. Due to bladder management, bowel management, safety, skin/wound care, disease management, medication administration and patient education, does the patient require 24 hr/day rehab nursing? Yes 4. Does the patient require coordinated care of a physician, rehab nurse, therapy disciplines of PT/OT/SLP to address physical and functional deficits in the context of the above medical diagnosis(es)? Yes Addressing deficits in the following areas: balance, endurance, locomotion, strength, transferring, bathing, dressing, toileting, cognition, speech, language and psychosocial support 5. Can the patient actively participate in an intensive therapy program of at least 3 hrs of therapy per day at least 5 days per week? Yes 6. The potential for patient to make measurable gains while on inpatient rehab is excellent 7. Anticipated functional outcomes upon discharge from inpatient rehab are supervision and min assist  with PT, supervision and min assist with OT, supervision and min assist with SLP. 8. Estimated rehab length of stay to reach the above functional goals is: 15-20 days. 9. Anticipated discharge  destination: Home 10. Overall Rehab/Functional Prognosis: good   RECOMMENDATIONS: This patient's condition is appropriate for continued rehabilitative care in the following setting: Patient will likely require CIR, however recommend reeval by therapies.  Patient did relatively well on day of evaluation, but was not able to participate in therapy thereafter.  This morning noted to have limited supination/engagement,?  Due to lethargy versus stroke. Patient has agreed to participate in recommended program. Potentially Note that insurance prior  authorization may be required for reimbursement for recommended care.   Comment: Rehab Admissions Coordinator to follow up.   I have personally performed a face to face diagnostic evaluation, including, but not limited to relevant history and physical exam findings, of this patient and developed relevant assessment and plan.  Additionally, I have reviewed and concur with the physician assistant's documentation above.    Maryla MorrowAnkit Patel, MD, ABPMR Mcarthur Rossettianiel J Angiulli, PA-C 06/10/2019        Revision History                     Routing History

## 2019-06-12 NOTE — H&P (Signed)
William Ellison is a 59 y.o. male who has presented today for surgery, with the diagnosis of stroke.  The various methods of treatment have been discussed with the patient and family. After consideration of risks, benefits and other options for treatment, the patient has consented to  Procedure(s): TRANSESOPHAGEAL ECHOCARDIOGRAM (TEE) (N/A) as a surgical intervention .  The patient's history has been reviewed, patient examined, no change in status, stable for surgery.  I have reviewed the patient's chart and labs.  Questions were answered to the patient's satisfaction.    Aydrian Halpin C. Duke Salvia, MD, [facility]  06/12/2019 12:32 PM

## 2019-06-12 NOTE — Progress Notes (Signed)
William Fennel, MD  Physician  Physical Medicine and Rehabilitation  PMR Pre-admission      Addendum  Date of Service:  06/11/2019  3:02 PM      Related encounter: ED to Hosp-Admission (Current) from 06/07/2019 in [facility]        Show:Clear all [x] Manual[x] Template[x] Copied  Added by: [x] , RN[x] , MD  [] Hover for details PMR Admission Coordinator Pre-Admission Assessment   Patient: William Ellison is an 59 y.o., male MRN: DOB: [birth year] Height: 5\' 10"  (177.8 cm) Weight: 133.8 kg                                                                                                                                                  Insurance Information HMO:     PPO: yes     PCP:      IPA:      80/20:      OTHER:  PRIMARY: [employer] [place]      Policy#: [id number]      Subscriber: pt CM Name: [id number]     Phone#: [phone]     Fax#: Pre-Cert#: [id number]  Updates due 06/25/2019 Employer: [phone] Benefits:  Phone #: [phone]     Name: 6/1 Eff. Date: 01/11/2019     Deduct: $3000      Out of Pocket Max: $4500 includes deductible      Life Max: none  CIR: 80%      SNF: 80% 60 days per year Outpatient: 80%     Co-Pay: 30 visits combined PT And OT, 30 visits SLP Home Health: 80%      Co-Pay: visits limited per medical neccesity DME: 80%     Co-Pay: 20% Providers: in network  SECONDARY: none      Financial Counselor:       Phone#:    The 06/27/2019" for patients in Inpatient Rehabilitation Facilities with attached "Privacy Act Statement-Health Care Records" was provided and verbally reviewed with: N/A   Emergency Contact Information Contact Information       Name Relation Home Work Mobile    smith,stephanie Significant other [phone]   [phone]         Current Medical History  Patient Admitting Diagnosis: CVA   History of Present Illness:   William Ellison is a 59 y.o.  right-handed male with history of hypertension.  History taken from chart review and wife due to cognition. Patient lives with spouse.  Independent prior to admission working as a [phone].  He presents on 06/07/19 with AMS and word finding difficulties. CT/MRI showed scattered foci of acute subacute nonhemorrhagic left MCA territory, predominantly posterior infarct.  Minimal involvement of the precentral gyrus.  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  Admission chemistries  with creatinine 1.28, ammonia level 56, urinalysis negative nitrite, hemoglobin A1c 6.2.  Patient did not receive TPA.  Echocardiogram with EF on 65% with no wall motion abnormalities.  Neurology consulted, plan for TEE, work-up ongoing presently on aspirin and Plavix for CVA prophylaxis.  Subcutaneous Lovenox for DVT prophylaxis.  Bilateral lower extremity Dopplers negative.   TEE 6/2 with LVEF >55%, trivial MR/TR, no LA/LAA thrombus or mass. No ASD/PFO. Plans for LOOP placement today.   Complete NIHSS TOTAL: 6 Glasgow Coma Scale Score: 14   Past Medical History      Past Medical History:  Diagnosis Date  . Erectile dysfunction    . Hypertension    . Skin rash        Family History  family history includes Hypertension in his mother.   Prior Rehab/Hospitalizations:  Has the patient had prior rehab or hospitalizations prior to admission? Yes   Has the patient had major surgery during 100 days prior to admission? No   Current Medications    Current Facility-Administered Medications:  .  [MAR Hold]  stroke: mapping our early stages of recovery book, , Does not apply, Once, Biby, Sharon L, NP .  0.9 %  sodium chloride infusion, , Intravenous, Continuous, Biby, Sharon L, NP, Last Rate: 75 mL/hr at 06/12/19 0127, New Bag at 06/12/19 0127 .  0.9 %  sodium chloride infusion, , Intravenous, Continuous, Kroeger, Krista M., PA-C .  [MAR Hold] acetaminophen  (TYLENOL) tablet 650 mg, 650 mg, Oral, Q4H PRN **OR** [MAR Hold] acetaminophen (TYLENOL) 160 MG/5ML solution 650 mg, 650 mg, Per Tube, Q4H PRN **OR** [MAR Hold] acetaminophen (TYLENOL) suppository 650 mg, 650 mg, Rectal, Q4H PRN, Biby, Sharon L, NP .  Mitzi Hansen Hold] amLODipine (NORVASC) tablet 5 mg, 5 mg, Oral, Daily, Naomie Dean B, MD, 5 mg at 06/12/19 0921 .  [MAR Hold] aspirin EC tablet 325 mg, 325 mg, Oral, Daily, Marvel Plan, MD, 325 mg at 06/12/19 0920 .  [MAR Hold] atorvastatin (LIPITOR) tablet 80 mg, 80 mg, Oral, Daily, Biby, Sharon L, NP, 80 mg at 06/12/19 0921 .  butamben-tetracaine-benzocaine (CETACAINE) spray, , , PRN, Chilton Si, MD, 1 spray at 06/12/19 1222 .  [MAR Hold] clopidogrel (PLAVIX) tablet 75 mg, 75 mg, Oral, Daily, Biby, Sharon L, NP, 75 mg at 06/12/19 0921 .  [MAR Hold] enoxaparin (LOVENOX) injection 65 mg, 0.5 mg/kg, Subcutaneous, Q24H, Biby, Sharon L, NP, 65 mg at 06/12/19 0921 .  [MAR Hold] sodium chloride 0.9 % bolus 500 mL, 500 mL, Intravenous, Once, Amada Jupiter, Hardin Negus, MD   Facility-Administered Medications Ordered in Other Encounters:  .  lidocaine (cardiac) 100 mg/2mL (XYLOCAINE) injection 2%, , Intravenous, Anesthesia Intra-op, Yolonda Kida, CRNA, 40 mg at 06/12/19 1224 .  propofol (DIPRIVAN) 10 mg/mL bolus/IV push, , Intravenous, Anesthesia Intra-op, Yolonda Kida, CRNA, 20 mg at 06/12/19 1233 .  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  Independent   Indoor Mobility: Did the patient need assistance with walking from room to room (with or without device)? Independent   Stairs: Did the patient need assistance with internal or external stairs (with or without device)? Independent   Functional Cognition: Did the patient need help planning regular tasks such as shopping or remembering to take medications? Independent   Home Assistive Devices / Equipment Home Equipment: None   Prior Device Use: Indicate devices/aids used by the patient prior to current illness, exacerbation or injury? None of the above   Current Functional Level Cognition   Arousal/Alertness: Lethargic Overall Cognitive Status: Impaired/Different from baseline Difficult to assess due to: Impaired communication Current Attention Level: Sustained Orientation Level: Oriented to person Following Commands: Follows one step commands inconsistently Safety/Judgement: Decreased awareness of safety, Decreased awareness of deficits General Comments: pt unable to complete most balance activities during session despite multimodal cues  Attention: Focused, Sustained Focused Attention: Appears intact Sustained Attention: Impaired Sustained Attention Impairment: Verbal basic Memory: (not assessed due to extent of language impairment)    Extremity Assessment (includes Sensation/Coordination)   Upper Extremity Assessment: Overall WFL for tasks assessed  Lower Extremity Assessment: Defer to PT evaluation     ADLs   Overall ADL's : Needs assistance/impaired Eating/Feeding: Set up Grooming: Wash/dry hands, Standing, Oral care, Wash/dry face, Applying deodorant, Maximal assistance, Sitting, Cueing for sequencing, Cueing for safety Grooming  Details (indicate cue type and reason): Requiring increased cues in order to incorporate RUE into tasks, increased apraxia and unintended movements noted with RUE, required increased mutimodal cues to incorporate, requiring HOH A to brush teeth and for appropriate sequencing of tasks, increased cues to attend to items on R Upper Body Bathing: Maximal assistance, Moderate assistance, Cueing for sequencing Upper Body Bathing Details (indicate cue type and reason): Requires multi-modal cues to sequence, unable to complete independently as pt will start with upper body, then face and be done (increased cueing needed for thoroughness) Lower Body Bathing: Moderate assistance, Cueing for safety, Cueing for sequencing, Sit to/from stand Lower Body Bathing Details (indicate cue type and reason): Perseverative on peri-care area, however demonstrated awareness of dirty rag, required cues to wet washcloth as pt attempted 2x with dry wash cloth and requiring tactile cues to wet washcloth, pt using solely L hand, and when prompted with R would switch washcloth back to L Upper Body Dressing : Moderate assistance Upper Body Dressing Details (indicate cue type and reason): Able to thread L side, requiring assistance with R to find arm hole Lower Body Dressing: Moderate assistance Toilet Transfer: Minimal assistance, Cueing for safety, Cueing for sequencing Toilet Transfer Details (indicate cue type and reason): Simulated with sit<>stands; continues to require multi-modal cues for sequencing Toileting- Clothing Manipulation and Hygiene: Minimal assistance Functional mobility during ADLs: Moderate assistance, Maximal assistance, Cueing for safety, Cueing for sequencing General ADL Comments: Noted to required increased cues for sequencing in basic ADLs, as well as incorporation of RUE into tasks, minimally impulsive in movement when completing functional mobility     Mobility   Overal bed mobility: Needs Assistance Bed  Mobility: Supine to Sit Supine to sit: Min guard General bed mobility comments: pt OOB in chair upon arrival  Transfers   Overall transfer level: Needs assistance Equipment used: None Transfers: Sit to/from Stand Sit to Stand: Min guard General transfer comment: min guard for safety due to instability upon standing and impulsivity     Ambulation / Gait / Stairs / Wheelchair Mobility   Ambulation/Gait Ambulation/Gait assistance: Min assist, Min guard Gait Distance (Feet): 200 Feet Assistive device: None Gait Pattern/deviations: Wide base of support, Step-through pattern, Decreased stride length, Drifts right/left General Gait Details: running into objects on R side at times; assist to steady when turning, stepping backwards, and directional changes Gait velocity: reduced Gait velocity interpretation: <1.8 ft/sec, indicate of risk for recurrent falls     Posture / Balance Dynamic Sitting Balance Sitting balance - Comments: close supervision at the edge of bed Balance Overall balance assessment: Needs assistance Sitting-balance support: Feet supported Sitting balance-Leahy Scale: Good Sitting balance - Comments: close supervision at the edge of bed Standing balance support: No upper extremity supported, During functional activity Standing balance-Leahy Scale: Fair Standing balance comment: noted to fatigue quickly in standing, however able to address peri care area in standing with min gaurd for safety and stability     Special needs/care consideration  Hgb A1c 6.2 Designated visitor is girlfriend, Judeth CornfieldStephanie who stay 24/7 to assist due to patient's aphasia LOOP to be placed 6/2    Previous Home Environment  Living Arrangements: Spouse/significant other  Lives With: Significant other(Stephanie) Available Help at Discharge: Family, Available 24 hours/day(Stephanie unemployed) Type of Home: Apartment Home Layout: One level Home Access: Level entry Bathroom Shower/Tub:  Engineer, manufacturing systemsTub/shower unit Bathroom Toilet: Standard Bathroom Accessibility: Yes How Accessible: Accessible via walker Home Care Services: No Additional Comments: Judeth CornfieldStephanie verified info   Discharge Living Setting Plans for Discharge Living Setting: Lives with (comment), Apartment(Stephanie, significant other) Type of Home at Discharge: Apartment Discharge Home Layout: One level Discharge Home Access: Level entry Discharge Bathroom Shower/Tub: Tub/shower unit Discharge Bathroom Toilet: Standard Does the patient have any problems obtaining your medications?: No   Social/Family/Support Systems Patient Roles: Parent(fulltime Curatormechanic) Contact Information: Judeth CornfieldStephanie Anticipated Caregiver: Transport plannertephanie Anticipated Caregiver's Contact Information: see above Ability/Limitations of Caregiver: no limitations Caregiver Availability: 24/7 Discharge Plan Discussed with Primary Caregiver: Yes Is Caregiver In Agreement with Plan?: Yes Does Caregiver/Family have Issues with Lodging/Transportation while Pt is in Rehab?: No   Goals Patient/Family Goal for Rehab: supervision to Mod I with PT, OT, and SLP Expected length of stay: [facility] 10-14 days. Pt/Family Agrees to Admission and willing to participate: Yes Program Orientation Provided & Reviewed with Pt/Caregiver Including Roles  & Responsibilities: Yes Additional Information Needs: Judeth CornfieldStephanie has been staying with patient 24/7 due to his aphasia  deficits   Decrease burden of Care through IP rehab admission: n/a   Possible need for SNF placement upon discharge:not anticipated   Patient Condition: This patient's medical and functional status has changed since the consult dated: 06/10/2019 in which the Rehabilitation Physician determined and documented that the patient's condition is appropriate for intensive rehabilitative care in an inpatient rehabilitation facility. See "History of Present Illness" (above) for medical update. Functional changes are: overall  min to mod assist. Patient's medical and functional status update has been discussed with the Rehabilitation physician and patient remains appropriate for inpatient rehabilitation. Will admit to inpatient rehab today.   Preadmission Screen Completed By:  Clois DupesBoyette, Aleni Andrus Godwin, RN, 06/12/2019 12:51 PM ______________________________________________________________________   Discussed status with Dr. Allena KatzPatel on 06/12/2019 at  1250 and received approval for admission today.   Admission Coordinator:  Clois DupesBoyette, Lynde Ludwig Godwin,  time 1250 Date 06/12/2019         Revision History

## 2019-06-12 NOTE — Transfer of Care (Signed)
Immediate Anesthesia Transfer of Care Note  Patient: William Ellison  Procedure(s) Performed: TRANSESOPHAGEAL ECHOCARDIOGRAM (TEE) (N/A ) BUBBLE STUDY  Patient Location: Endoscopy Unit  Anesthesia Type:MAC  Level of Consciousness: drowsy and patient cooperative  Airway & Oxygen Therapy: Patient Spontanous Breathing and Patient connected to face mask oxygen  Post-op Assessment: Report given to RN and Post -op Vital signs reviewed and stable  Post vital signs: Reviewed and stable  Last Vitals:  Vitals Value Taken Time  BP 149/87 06/12/19 1256  Temp 37.1 C 06/12/19 1256  Pulse 87 06/12/19 1258  Resp 14 06/12/19 1258  SpO2 98 % 06/12/19 1258  Vitals shown include unvalidated device data.  Last Pain:  Vitals:   06/12/19 1256  TempSrc: Axillary  PainSc:          Complications: No apparent anesthesia complications

## 2019-06-12 NOTE — Progress Notes (Signed)
Noted the patient has not been COVID tested (admitted 06/07/19)  D/w wife, he has not had any symptoms of illness, no cough SOB, fever. Pt without symptoms here afebrile He did travel to [place] and back a week or so ago via airplane, though no known COVID exposures.   TEE postponed  I have ordered rapid test hopefully can get done  Yevette Edwards, PA-C

## 2019-06-12 NOTE — Discharge Instructions (Signed)
Wound care instructions (chest, heart monitor site) Keep incision clean and dry for 3 days. You can remove outer dressing tomorrow. Leave steri-strips (little pieces of tape) on until seen in the office for wound check appointment. Call the office [phone]) for redness, drainage, swelling, or fever.
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
## 2019-06-12 NOTE — Anesthesia Preprocedure Evaluation (Signed)
Anesthesia Evaluation  Patient identified by MRN, date of birth, ID band Patient awake    Reviewed: Allergy & Precautions, NPO status , Patient's Chart, lab work & pertinent test results  Airway Mallampati: III       Dental no notable dental hx. (+) Teeth Intact   Pulmonary neg pulmonary ROS,    Pulmonary exam normal breath sounds clear to auscultation       Cardiovascular hypertension, Pt. on medications Normal cardiovascular exam Rhythm:Regular Rate:Normal     Neuro/Psych CVA, No Residual Symptoms negative psych ROS   GI/Hepatic negative GI ROS, Neg liver ROS,   Endo/Other  Morbid obesity  Renal/GU negative Renal ROS  negative genitourinary   Musculoskeletal negative musculoskeletal ROS (+)   Abdominal (+) + obese,   Peds  Hematology   Anesthesia Other Findings   Reproductive/Obstetrics                             Anesthesia Physical Anesthesia Plan  ASA: III  Anesthesia Plan: MAC   Post-op Pain Management:    Induction:   PONV Risk Score and Plan:   Airway Management Planned: Mask and Natural Airway  Additional Equipment: TEE  Intra-op Plan:   Post-operative Plan:   Informed Consent: I have reviewed the patients History and Physical, chart, labs and discussed the procedure including the risks, benefits and alternatives for the proposed anesthesia with the patient or authorized representative who has indicated his/her understanding and acceptance.     Dental advisory given  Plan Discussed with: CRNA  Anesthesia Plan Comments:         Anesthesia Quick Evaluation

## 2019-06-12 NOTE — CV Procedure (Signed)
Brief TEE Note  LVEF >55% Trivial MR/TR No LA/LAA thrombus or mass No ASD/PFO by saline microcavitation study or color flow Doppler  For additional detail, see full report.  Khori Underberg C. Duke Salvia, MD, [facility] 06/12/2019 12:44 PM

## 2019-06-12 NOTE — Anesthesia Postprocedure Evaluation (Signed)
Anesthesia Post Note  Patient: Chuong Casebeer  Procedure(s) Performed: TRANSESOPHAGEAL ECHOCARDIOGRAM (TEE) (N/A ) BUBBLE STUDY     Patient location during evaluation: Endoscopy Anesthesia Type: MAC Level of consciousness: awake and sedated Pain management: pain level controlled Vital Signs Assessment: post-procedure vital signs reviewed and stable Respiratory status: spontaneous breathing Cardiovascular status: stable Postop Assessment: no apparent nausea or vomiting Anesthetic complications: no    Last Vitals:  Vitals:   06/12/19 1316 06/12/19 1327  BP: (!) 171/98 (!) 175/85  Pulse: 85 80  Resp: 14 14  Temp:    SpO2: 95% 99%    Last Pain:  Vitals:   06/12/19 1327  TempSrc:   PainSc: 0-No pain   Pain Goal:                   Huston Foley

## 2019-06-12 NOTE — Progress Notes (Signed)
Inpatient Rehabilitation Admissions Coordinator  I have insurance approval to admit patient to CIR. Patient at TEE. I met with his significant other at bedside and she is in agreement to admit today after TEE and possible LOOP. I have contacted Stroke service, acute team and TOC team. I will make the arrangements to admit later today.  Danne Baxter, RN, MSN Rehab Admissions Coordinator [phone] 06/12/2019 12:26 PM\
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
## 2019-06-12 NOTE — Progress Notes (Signed)
PT Cancellation Note  Patient Details Name: William Ellison MRN: [id number] DOB: December 23, 1960   Cancelled Treatment:    Reason Eval/Treat Not Completed: Patient at procedure or test/unavailable Pt off unit for TEE. PT will continue to follow acutely.    Erline Levine, PTA Acute Rehabilitation Services Pager: [phone] Office: [phone]   06/12/2019, 11:42 AM

## 2019-06-12 NOTE — Progress Notes (Signed)
  Echocardiogram 2D Echocardiogram has been performed.  Gerda Diss 06/12/2019, 1:24 PM
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
## 2019-06-13 ENCOUNTER — Other Ambulatory Visit: Payer: Self-pay

## 2019-06-13 ENCOUNTER — Inpatient Hospital Stay (HOSPITAL_COMMUNITY): Payer: BC Managed Care – PPO

## 2019-06-13 ENCOUNTER — Inpatient Hospital Stay (HOSPITAL_COMMUNITY): Payer: BC Managed Care – PPO | Admitting: Speech Pathology

## 2019-06-13 ENCOUNTER — Inpatient Hospital Stay (HOSPITAL_COMMUNITY): Payer: BC Managed Care – PPO | Admitting: Occupational Therapy

## 2019-06-13 DIAGNOSIS — I639 Cerebral infarction, unspecified: Secondary | ICD-10-CM

## 2019-06-13 LAB — CBC WITH DIFFERENTIAL/PLATELET
Abs Immature Granulocytes: 0.01 10*3/uL (ref 0.00–0.07)
Basophils Absolute: 0 10*3/uL (ref 0.0–0.1)
Basophils Relative: 0 %
Eosinophils Absolute: 0.1 10*3/uL (ref 0.0–0.5)
Eosinophils Relative: 1 %
HCT: 45.1 % (ref 39.0–52.0)
Hemoglobin: 15.1 g/dL (ref 13.0–17.0)
Immature Granulocytes: 0 %
Lymphocytes Relative: 38 %
Lymphs Abs: 2.3 10*3/uL (ref 0.7–4.0)
MCH: 31.2 pg (ref 26.0–34.0)
MCHC: 33.5 g/dL (ref 30.0–36.0)
MCV: 93.2 fL (ref 80.0–100.0)
Monocytes Absolute: 0.5 10*3/uL (ref 0.1–1.0)
Monocytes Relative: 8 %
Neutro Abs: 3.2 10*3/uL (ref 1.7–7.7)
Neutrophils Relative %: 53 %
Platelets: 305 10*3/uL (ref 150–400)
RBC: 4.84 MIL/uL (ref 4.22–5.81)
RDW: 12.6 % (ref 11.5–15.5)
WBC: 6.1 10*3/uL (ref 4.0–10.5)
nRBC: 0 % (ref 0.0–0.2)

## 2019-06-13 LAB — COMPREHENSIVE METABOLIC PANEL
ALT: 36 U/L (ref 0–44)
AST: 26 U/L (ref 15–41)
Albumin: 3.6 g/dL (ref 3.5–5.0)
Alkaline Phosphatase: 47 U/L (ref 38–126)
Anion gap: 11 (ref 5–15)
BUN: 10 mg/dL (ref 6–20)
CO2: 26 mmol/L (ref 22–32)
Calcium: 9 mg/dL (ref 8.9–10.3)
Chloride: 101 mmol/L (ref 98–111)
Creatinine, Ser: 1.13 mg/dL (ref 0.61–1.24)
GFR calc Af Amer: >60 mL/min (ref >60)
GFR calc non Af Amer: >60 mL/min (ref >60)
Glucose, Bld: 90 mg/dL (ref 70–99)
Potassium: 3.9 mmol/L (ref 3.5–5.1)
Sodium: 138 mmol/L (ref 135–145)
Total Bilirubin: 1.4 mg/dL — ABNORMAL HIGH (ref 0.3–1.2)
Total Protein: 7.1 g/dL (ref 6.5–8.1)
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
## 2019-06-13 NOTE — Progress Notes (Signed)
Inpatient Rehabilitation  Patient information reviewed and entered into eRehab system by Tobe Kervin M. Shelva Hetzer, M.A., CCC/SLP, PPS Coordinator.  Information including medical coding, functional ability and quality indicators will be reviewed and updated through discharge.    

## 2019-06-13 NOTE — Progress Notes (Addendum)
Pt arrived on floor at 19:05, pt alert and in no physical distress.

## 2019-06-13 NOTE — Progress Notes (Signed)
Patient ID: William Ellison, male   DOB: 1960-08-09, 59 y.o.   MRN: [id number]  SW made efforts to meet with pt to complete assessment. Pt struggled with answering SW questions. SW will make efforts to complete assessment with family.   Cecile Sheerer, MSW, LCSWA Office: [phone] Cell: [phone] Fax: [phone]

## 2019-06-13 NOTE — Progress Notes (Signed)
[facility] PHYSICAL MEDICINE & REHABILITATION PROGRESS NOTE   Subjective/Complaints:  Pt does report with handgestures and nodding, and some words had LBM "lately'- denies constipation.   Sleepy per pt and OT.  Denies pain.  About to eat breakfast- was brought when asleep and was just warmed up for pt by NT.   ROS: .hard to assess due to aphasia  Objective:   EP PPM/ICD IMPLANT  Result Date: 06/12/2019 SURGEON:  Loman Brooklyn, MD   PREPROCEDURE DIAGNOSIS:  Cryptogenic Stroke   POSTPROCEDURE DIAGNOSIS:  Cryptogenic Stroke    PROCEDURES:  1. Implantable loop recorder implantation   INTRODUCTION:  Lysle Yero is a 59 y.o. male with a history of unexplained stroke who presents today for implantable loop implantation.  The patient has had a cryptogenic stroke.  Despite an extensive workup by neurology, no reversible causes have been identified.  he has worn telemetry during which he did not have arrhythmias.  There is significant concern for possible atrial fibrillation as the cause for the patients stroke.  The patient therefore presents today for implantable loop implantation.   DESCRIPTION OF PROCEDURE:  Informed written consent was obtained, and the patient was brought to the electrophysiology lab in a fasting state.  The patient required no sedation for the procedure today.  Mapping over the patient's chest was performed by the EP lab staff to identify the area where electrograms were most prominent for ILR recording.  This area was found to be the left parasternal region over the 3rd-4th intercostal space. The patients left chest was therefore prepped and draped in the usual sterile fashion by the EP lab staff. The skin overlying the left parasternal region was infiltrated with lidocaine for local analgesia.  A 0.5-cm incision was made over the left parasternal region over the 3rd intercostal space.  A subcutaneous ILR pocket was fashioned using a combination of sharp and blunt dissection.  A  Medtronic Reveal [place] model [id number] S implantable loop recorder was then placed into the pocket  R waves were very prominent and measured 0.82mV. EBL<1 ml.  Steri- Strips and a sterile dressing were then applied.  There were no early apparent complications.   CONCLUSIONS:  1. Successful implantation of a Medtronic Reveal LINQ implantable loop recorder for cryptogenic stroke  2. No early apparent complications.   ECHO TEE  Result Date: 06/12/2019    TRANSESOPHOGEAL ECHO REPORT   Patient Name:   KAZ AULD Date of Exam: 06/12/2019 Medical Rec #:  [id number]       Height:       70.0 in Accession #:    [id number]      Weight:       295.0 lb Date of Birth:  [birth year]       BSA:          2.462 m Patient Age:    59 years        BP:           151/106 mmHg Patient Gender: M               HR:           84 bpm. Exam Location:  Inpatient Procedure: 2D Echo, Color Doppler and Cardiac Doppler Indications:     Stroke  History:         Patient has prior history of Echocardiogram examinations, most                  recent 06/07/2019. Risk Factors:Hypertension.  Sonographer:     Ross Ludwig RDCS (AE) Referring Phys:  [id number] Beatriz Stallion Diagnosing Phys: Chilton Si MD PROCEDURE: After discussion of the risks and benefits of a TEE, an informed consent was obtained from the patient. The transesophogeal probe was passed without difficulty through the esophogus of the patient. Local oropharyngeal anesthetic was provided with Cetacaine. Sedation performed by different physician. The patient was monitored while under deep sedation. Anesthestetic sedation was provided intravenously by Anesthesiology: 264.33mg  of Propofol, 40mg  of Lidocaine. Image quality was adequate. The patient's vital signs; including heart rate, blood pressure, and oxygen saturation; remained stable throughout the procedure. The patient developed no complications during the procedure. IMPRESSIONS  1. Left ventricular ejection fraction, by  estimation, is 60 to 65%. The left ventricle has normal function. The left ventricle has no regional wall motion abnormalities.  2. Right ventricular systolic function is normal. The right ventricular size is normal.  3. No left atrial/left atrial appendage thrombus was detected.  4. The mitral valve is normal in structure. Trivial mitral valve regurgitation. No evidence of mitral stenosis.  5. The aortic valve is normal in structure. Aortic valve regurgitation is not visualized. No aortic stenosis is present.  6. The inferior vena cava is normal in size with greater than 50% respiratory variability, suggesting right atrial pressure of 3 mmHg.  7. Agitated saline contrast bubble study was negative, with no evidence of any interatrial shunt. Conclusion(s)/Recommendation(s): Normal biventricular function without evidence of hemodynamically significant valvular heart disease. FINDINGS  Left Ventricle: Left ventricular ejection fraction, by estimation, is 60 to 65%. The left ventricle has normal function. The left ventricle has no regional wall motion abnormalities. The left ventricular internal cavity size was normal in size. There is  no left ventricular hypertrophy. Right Ventricle: The right ventricular size is normal. No increase in right ventricular wall thickness. Right ventricular systolic function is normal. Left Atrium: Left atrial size was normal in size. No left atrial/left atrial appendage thrombus was detected. Right Atrium: Right atrial size was normal in size. Pericardium: There is no evidence of pericardial effusion. Mitral Valve: The mitral valve is normal in structure. Normal mobility of the mitral valve leaflets. Trivial mitral valve regurgitation. No evidence of mitral valve stenosis. Tricuspid Valve: The tricuspid valve is normal in structure. Tricuspid valve regurgitation is not demonstrated. No evidence of tricuspid stenosis. Aortic Valve: The aortic valve is normal in structure. Aortic valve  regurgitation is not visualized. No aortic stenosis is present. Pulmonic Valve: The pulmonic valve was normal in structure. Pulmonic valve regurgitation is not visualized. No evidence of pulmonic stenosis. Aorta: The aortic root is normal in size and structure. Venous: The inferior vena cava is normal in size with greater than 50% respiratory variability, suggesting right atrial pressure of 3 mmHg. IAS/Shunts: No atrial level shunt detected by color flow Doppler. Agitated saline contrast was given intravenously to evaluate for intracardiac shunting. Agitated saline contrast bubble study was negative, with no evidence of any interatrial shunt. There  is no evidence of a patent foramen ovale. There is no evidence of an atrial septal defect. MD Electronically signed by Chilton Si MD Signature Date/Time: 06/12/2019/1:31:52 PM    Final    Recent Labs    06/13/19 0545  WBC 6.1  HGB 15.1  HCT 45.1  PLT 305   Recent Labs    06/13/19 0545  NA 138  K 3.9  CL 101  CO2 26  GLUCOSE 90  BUN 10  CREATININE 1.13  CALCIUM 9.0    Intake/Output Summary (Last 24 hours) at 06/13/2019 1027 Last data filed at 06/13/2019 0904 Gross per 24 hour  Intake 25 ml  Output 800 ml  Net -775 ml     Physical Exam: Vital Signs Blood pressure (!) 156/86, pulse 83, temperature 98.7 F (37.1 C), temperature source Oral, resp. rate 20, height 5\' 10"  (1.778 m), weight 132.8 kg, SpO2 97 %.   Physical Exam  Vitals reviewed. Constitutional:  Pt obese, sitting in manual w/c in room with OT, appropriate, severe word finding deficits, but can say things "socially", NAD HENT: conjugate gaze  CV: RRR  Respiratory:CTA B/L- no W/R/R- good air movement GI: protuberant Soft, NT, ND, (+)BS  Musculoskeletal:     Comments: No edema or tenderness in extremities  Neurological: He is alert.  Expressive >receptive aphasia Motor: Limited by participation, but moving left side freely. Right upper extremity?   Shoulder abduction 2+/5, distally 3+/5 Not moving right lower extremity. Right neglect  Skin: Skin is warm and dry.  Psychiatric:  Smiling intermittently    Assessment/Plan: 1. 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  Physiatrist is providing close team supervision and 24 hour management of active medical problems listed below.  Physiatrist and rehab team continue to assess barriers to discharge/monitor patient progress toward functional and medical goals  Care Tool:  Bathing              Bathing assist       Upper Body Dressing/Undressing Upper body dressing        Upper body assist      Lower Body Dressing/Undressing Lower body dressing            Lower body assist       Toileting Toileting    Toileting assist Assist for toileting: Moderate Assistance - Patient 50 - 74%     Transfers Chair/bed transfer  Transfers assist     Chair/bed transfer assist level: Moderate Assistance - Patient 50 - 74%     Locomotion Ambulation   Ambulation assist              Walk 10 feet activity   Assist           Walk 50 feet activity   Assist           Walk 150 feet activity   Assist           Walk 10 feet on uneven surface  activity   Assist           Wheelchair     Assist               Wheelchair 50 feet with 2 turns activity    Assist            Wheelchair 150 feet activity     Assist          Blood pressure (!) 156/86, pulse 83, temperature 98.7 F (37.1 C), temperature source Oral, resp. rate 20, height 5\' 10"  (1.778 m), weight 132.8 kg, SpO2 97 %.  Medical Problem List and Plan: 1.  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             -patient may shower             -ELOS/Goals: 7-11  days/supervision/mod I.  Admit to CIR 2.  Antithrombotics: -DVT/anticoagulation:  Pharmaceutical: Lovenox             -antiplatelet therapy: DAPT--ASA 325 mg/Plavix 75 mg.  3. Pain Management:   6/3- pt denies pain- has tylenol prn 4. Mood: LCSW to follow for evaluation and support.              -antipsychotic agents: N/A 5. Neuropsych: This patient is not fully capable of making decisions on his own behalf. 6. Skin/Wound Care: Routine pressure relief  Measures.  7. Fluids/Electrolytes/Nutrition:  Monitor I/O.  CMP ordered for tomorrow a.m. 8. HTN: Monitor BP tid--continue Amlodipine daily. Continue to hold Lotensin/HCTZ--avoid hypotension.   6/3- BP 156/86- a little elevated, but will do permissive HTN for a few more days, so will monitor             Monitor with increased mobility 9. Dyslipidemia: On high dose statin.  10. Pre-diabetes: Hgb A1c- 6.2. Will have RD educate on CM diet  6/3- placed dietician consult for dietary education.              Monitor with increased mobility 11. Morbid obesity: BMI 42.3.  Educate/encourage weight loss.  6/3- as per #10    LOS: 1 days A FACE TO FACE EVALUATION WAS PERFORMED  Punam Broussard 06/13/2019, 10:27 AM
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
## 2019-06-13 NOTE — Progress Notes (Signed)
Delayed charting
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
## 2019-06-14 ENCOUNTER — Inpatient Hospital Stay (HOSPITAL_COMMUNITY): Payer: BC Managed Care – PPO

## 2019-06-14 ENCOUNTER — Inpatient Hospital Stay (HOSPITAL_COMMUNITY): Payer: BC Managed Care – PPO | Admitting: Speech Pathology

## 2019-06-14 ENCOUNTER — Inpatient Hospital Stay (HOSPITAL_COMMUNITY): Payer: BC Managed Care – PPO | Admitting: Occupational Therapy

## 2019-06-14 NOTE — Progress Notes (Signed)
Speech Language Pathology Daily Session Note  Patient Details  Name: Mihir Flanigan MRN: [id number] Date of Birth: 10/20/1960  Today's Date: 06/14/2019 SLP Individual Time: 0910-0930 SLP Individual Time Calculation (min): 20 min and Today's Date: 06/14/2019 SLP Missed Time: 25 Minutes Missed Time Reason: Patient fatigue  Short Term Goals: Week 1: SLP Short Term Goal 1 (Week 1): Pt will verbally respond to questions regarding his wants and needs with Max A cues for functional communication and word finding. SLP Short Term Goal 2 (Week 1): Pt will utilize gestures or a communication board to express basic wants and needs with Mod A cues. SLP Short Term Goal 3 (Week 1): Pt will generate names of items/objects during divergent naming tasks with Max A semantic/phonemic cues. SLP Short Term Goal 4 (Week 1): Pt will sustain attention to functional tasks for 10 minute intervals with Mod A cues for redirection.  Skilled Therapeutic Interventions: Skilled treatment session focused on cognitive-linguistic goals. Upon arrival, patient was asleep and the RN was present. The RN reported patient received a sleeping medication last night and staff was having difficulty waking the patient up. SLP facilitated session by providing Max A environmental and tactile cues for arousal (repostioning, turning on lights, applying a cold compress). Patient eventually roused long enough to take his medications but quickly fell back asleep. SLP attempted to engaged patient in language tasks, however, patient with minimal verbal output with Max verbal cues needed to maintain his eye being open. Therefore, patient missed remaining 25 minutes of session due to fatigue. Patient left supine in bed with alarm on and all needs within reach. Continue with current plan of care.      Pain No/Denies Pain   Therapy/Group: Individual Therapy  Maisen Klingler 06/14/2019, 9:32 AM

## 2019-06-14 NOTE — IPOC Note (Signed)
Overall Plan of Care [facility]) Patient Details Name: Tonnie Friedel MRN: [id number] DOB: 1960/12/06  Admitting Diagnosis: Ischemic stroke [facility])  Hospital Problems: Principal Problem:   Ischemic stroke (HCC) L MCA embolic vs large vessle w/ L M1 occlusion  Active Problems:   Acute ischemic left MCA stroke (HCC)     Functional Problem List: Nursing Bowel, Endurance, Pain, Safety, Perception, Sensory, Bladder  PT Balance, Behavior, Motor, Endurance, Pain, Perception, Safety  OT Balance, Cognition, Endurance, Motor, Perception, Safety, Vision  SLP Cognition, Linguistic  TR         Basic ADL's: OT Eating, Grooming, Bathing, Dressing, Toileting     Advanced  ADL's: OT       Transfers: PT Bed Mobility, Bed to Chair, Customer service manager, Tub/Shower     Locomotion: PT Ambulation     Additional Impairments: OT Fuctional Use of Upper Extremity  SLP Communication, Social Cognition expression Problem Solving, Attention  TR      Anticipated Outcomes Item Anticipated Outcome  Self Feeding mod I  Swallowing      Basic self-care  supervision/set up  Toileting  supervision   Bathroom Transfers supervision  Bowel/Bladder  To be continent x2  Transfers  supervision to [facility])  Locomotion  supervision to [facility])  Communication  Min A  Cognition  Supervision A  Pain  to be less than 3 out 10 on pain scale  Safety/Judgment  No report of injuries or fall this admission   Therapy Plan: PT Intensity: Minimum of 1-2 x/day ,45 to 90 minutes PT Frequency: 5 out of 7 days PT Duration Estimated Length of Stay: 10-14 days OT Intensity: Minimum of 1-2 x/day, 45 to 90 minutes OT Frequency: 5 out of 7 days OT Duration/Estimated Length of Stay: 10-14 days SLP Intensity: Minumum of 1-2 x/day, 30 to 90 minutes SLP Frequency: 3 to 5 out of 7 days SLP Duration/Estimated Length of Stay: 10-14 days   Due to the current state of emergency, patients may not be receiving their 3-hours of  Medicare-mandated therapy.   Team Interventions: Nursing Interventions Patient/Family Education, Bladder Management, Bowel Management, Disease Management/Prevention, Pain Management, Medication Management, Discharge Planning, Psychosocial Support  PT interventions Ambulation/gait training, DME/adaptive equipment instruction, Community reintegration, Neuromuscular re-education, Psychosocial support, Stair training, UE/LE Strength taining/ROM, Warden/ranger, Discharge planning, Functional electrical stimulation, Pain management, Therapeutic Activities, UE/LE Coordination activities, Cognitive remediation/compensation, Disease management/prevention, Functional mobility training, Patient/family education, Therapeutic Exercise, Visual/perceptual remediation/compensation  OT Interventions Balance/vestibular training, Neuromuscular re-education, Self Care/advanced ADL retraining, Therapeutic Exercise, Wheelchair propulsion/positioning, Cognitive remediation/compensation, DME/adaptive equipment instruction, UE/LE Strength taining/ROM, Firefighter, Equities trader education, UE/LE Coordination activities, Discharge planning, Functional mobility training, Therapeutic Activities, Visual/perceptual remediation/compensation  SLP Interventions Cognitive remediation/compensation, Internal/external aids, Multimodal communication approach, Cueing hierarchy, Functional tasks, Patient/family education, Speech/Language facilitation  TR Interventions    SW/CM Interventions Psychosocial Support, Patient/Family Education, Discharge Planning   Barriers to Discharge MD  Home enviroment access/loayout, Lack of/limited family support, Weight and sedation  Nursing      PT      OT      SLP      SW       Team Discharge Planning: Destination: PT-Home ,OT- Home , SLP-Home Projected Follow-up: PT-24 hour supervision/assistance, OT-  Outpatient OT, SLP-24 hour supervision/assistance, Outpatient SLP,  Home Health SLP Projected Equipment Needs: PT-To be determined, OT- To be determined, SLP-None recommended by SLP Equipment Details: PT- , OT-  Patient/family involved in discharge planning: PT- Patient,  OT-Patient, SLP-Patient  MD ELOS: 10-14 days  Medical Rehab Prognosis:  Good Assessment: Pt is a 59 yr old male with HTN and prediabetes with A1c of 6.2-and BMI of 42  with L MCA stroke with R hemiparesis, aphasia, and R neglect. Ordered dietician to do dietary education on weight and DM   Pt so sedated, will stop Trazodone- and might need provigil vs ritalin  Goals- Supervision to [facility]  See Team Conference Notes for weekly updates to the plan of care

## 2019-06-14 NOTE — Progress Notes (Signed)
Inpatient Rehabilitation Care Coordinator Assessment and Plan  Patient Details  Name: William Ellison MRN: [id number] Date of Birth: Aug 13, 1960  Today's Date: 06/14/2019  Problem List:  Patient Active Problem List   Diagnosis Date Noted  . Acute ischemic left MCA stroke ([place]) 06/12/2019  . Morbid obesity ([place])   . Dyslipidemia   . Ischemic stroke ([place]) L MCA embolic vs large vessle w/ L M1 occlusion    . Benign essential HTN   . Prediabetes   . Hypokalemia   . Stroke (cerebrum) ([place]) 06/07/2019  . Hypertensive urgency 06/25/2018  . Hypertension 06/25/2018  . Blood pressure elevated without history of HTN 06/20/2018  . Class 3 severe obesity due to excess calories with serious comorbidity and body mass index (BMI) of 40.0 to 44.9 in adult ([place]) 06/20/2018  . Erectile dysfunction 06/20/2018  . Skin rash 06/20/2018   Past Medical History:  Past Medical History:  Diagnosis Date  . Erectile dysfunction   . Hypertension   . Skin rash    Past Surgical History:  Past Surgical History:  Procedure Laterality Date  . BUBBLE STUDY  06/12/2019   Procedure: BUBBLE STUDY;  Surgeon: Skeet Latch, MD;  Location: [place];  Service: Cardiovascular;;  . LOOP RECORDER INSERTION N/A 06/12/2019   Procedure: LOOP RECORDER INSERTION;  Surgeon: Constance Haw, MD;  Location: [place] [facility];  Service: Cardiovascular;  Laterality: N/A;  . TEE WITHOUT CARDIOVERSION N/A 06/12/2019   Procedure: TRANSESOPHAGEAL ECHOCARDIOGRAM (TEE);  Surgeon: Skeet Latch, MD;  Location: [place];  Service: Cardiovascular;  Laterality: N/A;   Social History:  reports that he has never smoked. He has never used smokeless tobacco. He reports current alcohol use. He reports that he does not use drugs.  Family / Support Systems Marital Status: Single Patient Roles: Partner Spouse/Significant Other: William Ellison Children: 3 adult children Other Supports: various family Anticipated Caregiver: S/o  William Ellison Ability/Limitations of Caregiver: None reported Caregiver Availability: 24/7 Family Dynamics: Pt lives with his s/o.  Social History Preferred language: English Religion: Catholic Cultural Background: Pt works as an Catering manager. Education: Some college Read: Yes Write: Yes Employment Status: Employed Public relations account executive Issues: Denies Guardian/Conservator: N/A   Abuse/Neglect Abuse/Neglect Assessment Can Be Completed: Unable to assess, patient is non-responsive or altered mental status Physical Abuse: Denies Verbal Abuse: Denies Sexual Abuse: Denies Exploitation of patient/patient's resources: Denies Self-Neglect: Denies  Emotional Status Pt's affect, behavior and adjustment status: Pt in good spirits at time of visit. Recent Psychosocial Issues: Denies Psychiatric History: Denies Substance Abuse History: Denies  Patient / Family Perceptions, Expectations & Goals Pt/Family understanding of illness & functional limitations: Pt family have a general understanding of care needs Premorbid pt/family roles/activities: Independent Anticipated changes in roles/activities/participation: Assistance with ADLs/IADLs  Education officer, environmental Agencies: None Premorbid Home Care/DME Agencies: None Transportation available at discharge: family Resource referrals recommended: Neuropsychology  Discharge Planning Living Arrangements: Spouse/significant other Support Systems: Spouse/significant other, Children Type of Residence: Private residence Insurance Resources: Multimedia programmer (specify)(BCBS) Financial Resources: Employment Museum/gallery curator Screen Referred: No Living Expenses: Medical laboratory scientific officer Management: Patient, Spouse Does the patient have any problems obtaining your medications?: No Care Coordinator Anticipated Follow Up Needs: HH/OP Expected length of stay: 10-14 days  Clinical Impression SW met with pt, pt son, and s/o to introduce self, explain  role, and discuss discharge process.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 No HCPOA. No DME. S/o reports she will be available to care for him at time of discharge.  William Ellison 06/14/2019, 4:26 PM

## 2019-06-14 NOTE — Progress Notes (Addendum)
Patient ID: William Ellison, male   DOB: 02/08/1960, 59 y.o.   MRN: [id number]  SW left message for s/o Judeth Cornfield to discuss d/c plan, complete assessment, and provide updates on ELOS. SW waiting on follow-up.   Cecile Sheerer, MSW, LCSWA Office: [phone] Cell: [phone] Fax: [phone]

## 2019-06-14 NOTE — Progress Notes (Signed)
Pt found ambulating in room several times without assistance from Staff. RN found wife giving patient shower this morning after night shift RN had explained this was not out policy when this happened last night. RN spoke to OT to try to reinforce education with wife. After session NT reports that he found patient in chair that was not recliner where he was previously. Wife is helping patient transfer without the assistance of staff. Will reinforce safety plan and importance of having staff transfer patient.

## 2019-06-14 NOTE — Progress Notes (Signed)
[facility] Individual Statement of Services  Patient Name:  William Ellison  Date:  06/14/2019  Welcome to the [facility]patient [facility].  Our goal is to provide you with an individualized program based on your diagnosis and situation, designed to meet your specific needs.  With this comprehensive rehabilitation program, you will be expected to participate in at least 3 hours of rehabilitation therapies Monday-Friday, with modified therapy programming on the weekends.  Your rehabilitation program will include the following services:  Physical Therapy (PT), Occupational Therapy (OT), Speech Therapy (ST), 24 hour per day rehabilitation nursing, Therapeutic Recreaction (TR), Psychology, Neuropsychology, Care Coordinator, Rehabilitation Medicine, Nutrition Services, Pharmacy Services and Other  Weekly team conferences will be held on Tuesdays to discuss your progress.  Your Inpatient Rehabilitation Care Coordinator will talk with you frequently to get your input and to update you on team discussions.  Team conferences with you and your family in attendance may also be held.  Expected length of stay: 10-14 days    Overall anticipated outcome: Supervision  Depending on your progress and recovery, your program may change. Your Inpatient Rehabilitation Care Coordinator will coordinate services and will keep you informed of any changes. Your Inpatient Rehabilitation Care Coordinator's name and contact numbers are listed  below.  The following services may also be recommended but are not provided by the Inpatient Rehabilitation Center:   Driving Evaluations  Home Health Rehabiltiation Services  Outpatient Rehabilitation Services  Vocational Rehabilitation   Arrangements will be made to provide these services after discharge if needed.  Arrangements include referral to agencies that provide these services.  Your insurance has been verified to be:  [employer]  Your primary  doctor is:  Raliegh Ip  Pertinent information will be shared with your doctor and your insurance company.  Inpatient Rehabilitation Care Coordinator:  Susie Cassette [phone] or [phone]  Information discussed with and copy given to patient by: Gretchen Short, 06/14/2019, 10:50 AM

## 2019-06-14 NOTE — Plan of Care (Signed)
Nutrition Education Note  RD consulted for nutrition education regarding pre-diabetes and obesity. RD working remotely.  Given pt with aphasia, RD attached handouts to pt's AVS/Discharge Instructions. RD will attempt to follow up with pt's family next week as schedule allows.  Lab Results  Component Value Date   HGBA1C 6.2 (H) 06/07/2019    RD attached "Carbohydrate Counting for People with Diabetes" handout and "Label Ellison: Carbohydrates" handout from the Academy of [employer] Dietetics. Handouts discuss different food groups and their effects on blood sugar, emphasizing carbohydrate-containing foods. Provided list of carbohydrates and recommended serving sizes of common foods.  Handouts discuss importance of controlled and consistent carbohydrate intake throughout the day. Provided examples of ways to balance meals/snacks and encouraged intake of high-fiber, whole grain complex carbohydrates.  Body mass index is 42.01 kg/m. Pt meets criteria for obesity class III based on current BMI.  Current diet order is Heart Healthy, patient is consuming approximately 100% of meals at this time. Labs and medications reviewed. No further nutrition interventions warranted at this time. RD contact information provided. If additional nutrition issues arise, please re-consult RD.   William Reading, MS, RD, LDN Inpatient Clinical Dietitian Pager: [phone] Weekend/After Hours: [phone]

## 2019-06-14 NOTE — Progress Notes (Signed)
Occupational Therapy Session Note  Patient Details  Name: William Ellison MRN: [id number] Date of Birth: 06/03/1960  Today's Date: 06/14/2019 OT Individual Time: 7001-7494 OT Individual Time Calculation (min): 42 min   Short Term Goals: Week 1:  OT Short Term Goal 1 (Week 1): patient will complete bathing/shower with min A using ADs OT Short Term Goal 2 (Week 1): patient will complete dressing tasks with min A OT Short Term Goal 3 (Week 1): patient will complete funcitonal transfers and ambulation with LRAD with CGA and good awareness of right side OT Short Term Goal 4 (Week 1): patient will use right UE as functional assist for all self care tasks  Skilled Therapeutic Interventions/Progress Updates:    Pt greeted in bed with no c/o pain, finishing up lunch. With encouragement, he was agreeable to get dressed today. Supervision for supine<sit with HOB elevated. While EOB, pt reached for each article of clothing placed on his Rt side using his Rt hand with cuing. Dressing completed at sit<stand level without AD, CGA for dynamic standing balance and vcs for incorporating hemi techniques as needed. Mod A for footwear due to decreased safety when bending forward. For remainder of session worked on bimanual coordination, Rt attention, and dynamic standing balance while engaging in bedmaking tasks. Vcs to increase Rt hand use when applying sheets and blankets to bed. Significantly increased time and Min A to don several pillowcases on pillows due to Rt inattention and pt leaving out Rt side of pillow, dropping it a few times onto floor. Afterwards educated pt and spouse on use of red theraputty, encouraging pt to "make shapes" with both hands for NMR of the Rt side. Educated spouse throughout session regarding importance of allowing pt to complete tasks at max level of independence for NMR and overall functional recovery at home. She appeared more receptive to this near the end of the session. At end of tx  pt remained sitting in the recliner with all needs within reach, safety belt fastened.   Therapy Documentation Precautions:  Precautions Precautions: Fall Precaution Comments: R Sided Neglect, impulsive at times Restrictions Weight Bearing Restrictions: No Vital Signs: Therapy Vitals Temp: 98.7 F (37.1 C) Temp Source: Oral Pulse Rate: 83 Resp: 20 BP: 125/80 Patient Position (if appropriate): Sitting Oxygen Therapy SpO2: 100 % ADL: :     Therapy/Group: Individual Therapy  William Ellison A William Ellison 06/14/2019, 4:09 PM

## 2019-06-14 NOTE — Progress Notes (Signed)
[place] PHYSICAL MEDICINE & REHABILITATION PROGRESS NOTE   Subjective/Complaints:  Pt asleep- snoring- per nurse, no complaints.    ROS: Cannot assess due to sedation this AM  Objective:   EP PPM/ICD IMPLANT  Result Date: 06/12/2019 SURGEON:  Loman Brooklyn, MD   PREPROCEDURE DIAGNOSIS:  Cryptogenic Stroke   POSTPROCEDURE DIAGNOSIS:  Cryptogenic Stroke    PROCEDURES:  1. Implantable loop recorder implantation   INTRODUCTION:  William Ellison is a 59 y.o. male with a history of unexplained stroke who presents today for implantable loop implantation.  The patient has had a cryptogenic stroke.  Despite an extensive workup by neurology, no reversible causes have been identified.  he has worn telemetry during which he did not have arrhythmias.  There is significant concern for possible atrial fibrillation as the cause for the patients stroke.  The patient therefore presents today for implantable loop implantation.   DESCRIPTION OF PROCEDURE:  Informed written consent was obtained, and the patient was brought to the electrophysiology lab in a fasting state.  The patient required no sedation for the procedure today.  Mapping over the patient's chest was performed by the EP lab staff to identify the area where electrograms were most prominent for ILR recording.  This area was found to be the left parasternal region over the 3rd-4th intercostal space. The patients left chest was therefore prepped and draped in the usual sterile fashion by the EP lab staff. The skin overlying the left parasternal region was infiltrated with lidocaine for local analgesia.  A 0.5-cm incision was made over the left parasternal region over the 3rd intercostal space.  A subcutaneous ILR pocket was fashioned using a combination of sharp and blunt dissection.  A Medtronic Reveal [place] model [id number] implantable loop recorder was then placed into the pocket  R waves were very prominent and measured 0.24mV. EBL<1 ml.  Steri-  Strips and a sterile dressing were then applied.  There were no early apparent complications.   CONCLUSIONS:  1. Successful implantation of a Medtronic Reveal LINQ implantable loop recorder for cryptogenic stroke  2. No early apparent complications.   ECHO TEE  Result Date: 06/12/2019    TRANSESOPHOGEAL ECHO REPORT   Patient Name:   William Ellison Date of Exam: 06/12/2019 Medical Rec #:  [id number]       Height:       70.0 in Accession #:    [id number]      Weight:       295.0 lb Date of Birth:  [birth year]       BSA:          2.462 m Patient Age:    59 years        BP:           151/106 mmHg Patient Gender: M               HR:           84 bpm. Exam Location:  Inpatient Procedure: 2D Echo, Color Doppler and Cardiac Doppler Indications:     Stroke  History:         Patient has prior history of Echocardiogram examinations, most                  recent 06/07/2019. Risk Factors:Hypertension.  Sonographer:     Ross Ludwig RDCS (AE) Referring Phys:  [id number] Beatriz Stallion Diagnosing Phys: Chilton Si MD PROCEDURE: After discussion of the risks and benefits of a TEE, an  informed consent was obtained from the patient. The transesophogeal probe was passed without difficulty through the esophogus of the patient. Local oropharyngeal anesthetic was provided with Cetacaine. Sedation performed by different physician. The patient was monitored while under deep sedation. Anesthestetic sedation was provided intravenously by Anesthesiology: 264.33mg  of Propofol, 40mg  of Lidocaine. Image quality was adequate. The patient's vital signs; including heart rate, blood pressure, and oxygen saturation; remained stable throughout the procedure. The patient developed no complications during the procedure. IMPRESSIONS  1. Left ventricular ejection fraction, by estimation, is 60 to 65%. The left ventricle has normal function. The left ventricle has no regional wall motion abnormalities.  2. Right ventricular systolic function is normal.  The right ventricular size is normal.  3. No left atrial/left atrial appendage thrombus was detected.  4. The mitral valve is normal in structure. Trivial mitral valve regurgitation. No evidence of mitral stenosis.  5. The aortic valve is normal in structure. Aortic valve regurgitation is not visualized. No aortic stenosis is present.  6. The inferior vena cava is normal in size with greater than 50% respiratory variability, suggesting right atrial pressure of 3 mmHg.  7. Agitated saline contrast bubble study was negative, with no evidence of any interatrial shunt. Conclusion(s)/Recommendation(s): Normal biventricular function without evidence of hemodynamically significant valvular heart disease. FINDINGS  Left Ventricle: Left ventricular ejection fraction, by estimation, is 60 to 65%. The left ventricle has normal function. The left ventricle has no regional wall motion abnormalities. The left ventricular internal cavity size was normal in size. There is  no left ventricular hypertrophy. Right Ventricle: The right ventricular size is normal. No increase in right ventricular wall thickness. Right ventricular systolic function is normal. Left Atrium: Left atrial size was normal in size. No left atrial/left atrial appendage thrombus was detected. Right Atrium: Right atrial size was normal in size. Pericardium: There is no evidence of pericardial effusion. Mitral Valve: The mitral valve is normal in structure. Normal mobility of the mitral valve leaflets. Trivial mitral valve regurgitation. No evidence of mitral valve stenosis. Tricuspid Valve: The tricuspid valve is normal in structure. Tricuspid valve regurgitation is not demonstrated. No evidence of tricuspid stenosis. Aortic Valve: The aortic valve is normal in structure. Aortic valve regurgitation is not visualized. No aortic stenosis is present. Pulmonic Valve: The pulmonic valve was normal in structure. Pulmonic valve regurgitation is not visualized. No  evidence of pulmonic stenosis. Aorta: The aortic root is normal in size and structure. Venous: The inferior vena cava is normal in size with greater than 50% respiratory variability, suggesting right atrial pressure of 3 mmHg. IAS/Shunts: No atrial level shunt detected by color flow Doppler. Agitated saline contrast was given intravenously to evaluate for intracardiac shunting. Agitated saline contrast bubble study was negative, with no evidence of any interatrial shunt. There  is no evidence of a patent foramen ovale. There is no evidence of an atrial septal defect. Skeet Latch MD Electronically signed by Skeet Latch MD Signature Date/Time: 06/12/2019/1:31:52 PM    Final    Recent Labs    06/13/19 0545  WBC 6.1  HGB 15.1  HCT 45.1  PLT 305   Recent Labs    06/13/19 0545  NA 138  K 3.9  CL 101  CO2 26  GLUCOSE 90  BUN 10  CREATININE 1.13  CALCIUM 9.0    Intake/Output Summary (Last 24 hours) at 06/14/2019 0846 Last data filed at 06/13/2019 0904 Gross per 24 hour  Intake 25 ml  Output --  Net  25 ml     Physical Exam: Vital Signs Blood pressure (!) 147/94, pulse 71, temperature 98.4 F (36.9 C), temperature source Oral, resp. rate 20, height 5\' 10"  (1.778 m), weight 132.8 kg, SpO2 98 %.   Physical Exam  Vitals reviewed. Constitutional:  Pt asleep,s noring, woke and waved me away then back to sleep, NAD HENT: conjugate gaze  CV: RRR Respiratory: CTA b/l GI: protuberant Soft, NT, ND, (+)BS  Musculoskeletal:     Comments: No edema or tenderness in extremities  Neurological: He is alert.  Expressive >receptive aphasia Motor: Limited by participation, but moving left side freely. Right upper extremity?  Shoulder abduction 2+/5, distally 3+/5 Not moving right lower extremity. Right neglect  Skin: Skin is warm and dry.  Psychiatric:  Smiling intermittently    Assessment/Plan: 1. 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  Physiatrist is providing close team supervision and 24 hour management of active medical problems listed below.  Physiatrist and rehab team continue to assess barriers to discharge/monitor patient progress toward functional and medical goals  Care Tool:  Bathing    Body parts bathed by patient: Buttocks, Front perineal area, Right upper leg, Left upper leg   Body parts bathed by helper: Front perineal area, Buttocks, Right lower leg, Left lower leg, Right arm, Left arm     Bathing assist Assist Level: Set up assist     Upper Body Dressing/Undressing Upper body dressing   What is the patient wearing?: Hospital gown only    Upper body assist Assist Level: Minimal Assistance - Patient > 75%    Lower Body Dressing/Undressing Lower body dressing      What is the patient wearing?: Incontinence brief     Lower body assist Assist for lower body dressing: Moderate Assistance - Patient 50 - 74%     Toileting Toileting    Toileting assist Assist for toileting: Minimal Assistance - Patient > 75%     Transfers Chair/bed transfer  Transfers assist     Chair/bed transfer assist level: Contact Guard/Touching assist     Locomotion Ambulation   Ambulation assist      Assist level: Minimal Assistance - Patient > 75% Assistive device: No Device Max distance: >150'   Walk 10 feet activity   Assist     Assist level: Contact Guard/Touching assist Assistive device: No Device   Walk 50 feet activity   Assist    Assist level: Minimal Assistance - Patient > 75% Assistive device: No Device    Walk 150 feet activity   Assist    Assist level: Minimal Assistance - Patient > 75% Assistive device: No Device    Walk 10 feet on uneven surface  activity   Assist     Assist level: Contact Guard/Touching assist     Wheelchair     Assist Will patient use  wheelchair at discharge?: No             Wheelchair 50 feet with 2 turns activity    Assist            Wheelchair 150 feet activity     Assist          Blood pressure (!) 147/94, pulse 71, temperature 98.4 F (36.9 C), temperature source Oral, resp. rate 20, height 5\' 10"  (1.778 m), weight 132.8 kg, SpO2 98 %.  Medical Problem List and Plan:  1.  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             -patient may shower             -ELOS/Goals: 7-11 days/supervision/mod I.             Admit to CIR 2.  Antithrombotics: -DVT/anticoagulation:  Pharmaceutical: Lovenox             -antiplatelet therapy: DAPT--ASA 325 mg/Plavix 75 mg.  3. Pain Management:   6/3- pt denies pain- has tylenol prn 4. Mood: LCSW to follow for evaluation and support.              -antipsychotic agents: N/A 5. Neuropsych: This patient is not fully capable of making decisions on his own behalf. 6. Skin/Wound Care: Routine pressure relief  Measures.  7. Fluids/Electrolytes/Nutrition:  Monitor I/O.  CMP ordered for tomorrow a.m. 8. HTN: Monitor BP tid--continue Amlodipine daily. Continue to hold Lotensin/HCTZ--avoid hypotension.   6/3- BP 156/86- a little elevated, but will do permissive HTN for a few more days, so will monitor             Monitor with increased mobility 9. Dyslipidemia: On high dose statin.  10. Pre-diabetes: Hgb A1c- 6.2. Will have RD educate on CM diet  6/3- placed dietician consult for dietary education.              Monitor with increased mobility 11. Morbid obesity: BMI 42.3.  Educate/encourage weight loss.  6/3- as per #10 12. Insomnia  6/4- will stop trazodone- so sedated this AM, couldn't wake well    LOS: 2 days A FACE TO FACE EVALUATION WAS PERFORMED  Aryannah Mohon 06/14/2019, 8:46 AM

## 2019-06-14 NOTE — Progress Notes (Signed)
Physical Therapy Session Note  Patient Details  Name: Mickell Birdwell MRN: [id number] Date of Birth: 1960-09-27  Today's Date: 06/14/2019 PT Individual Time: 1401-1500 and 1602-1630 PT Individual Time Calculation (min): 59 min and 28 min  Short Term Goals: Week 1:  PT Short Term Goal 1 (Week 1): STG=LTG due to ELOS  Skilled Therapeutic Interventions/Progress Updates:     1st Session: Pt received standing next to bed in room. PT educates on importance of calling staff before getting out of bed or chair. Pt verbalizes understanding. No report of pain. Pt able to say full name but requires increased time and min cuing for birthday. Pt ambulates 300' with min/CGA and several instances of catching R toe during swing phase. Also requires increased cuing whenever making R turns. Pt performs NMR dynamic standing balance activity with airex and dynavision board. Pt first performs with alternating BUEs. Then pt performs 1 minute with only LUE, scoring 39 with an average time of 1.54 seconds. Pt performs 1 minute with only RUE, scoring 21 with average time of 2.86 seconds. Seated rest break. Pt ambulates 300' with CGA. Pt has normal gait speed but when performing multitasking gait speed slows significantly. Pt asked to count up by 2s and cannot count higher than 10 without making mistakes and demos much slower gait speed.  Pt performs Nustep for endurance training and reciprocal coordination training. Pt requires occasional cues to bring attention to RUE grip, which occasionally loses grasp of handle. Pt performs 5 minutes at workload of 3, 5 minutes at workload of 5, and 5 minutes at workload of 7.Pt visibly fatigued following activity. Gait x150' back to room with CGA. Pt left seated in chair with alarm intact and all needs within reach.  2nd Session: Pt received seated in chair and agreeable to therapy. No pain. Sit to stand with supervision for safety. Pt ambulates 150' x2 with seated rest break. Pt asked  to count down from 100 by 1s and is only able to correctly say "100", then begins counting up, to "200, 300". Pt then asked to count by 1s and is able to count to 30 correctly but has much slower gait speed with multitasking.  Pt performs standing balance activity with trampoline and green rubber ball. Pt initially throws with BUEs and catches rebound, requiring CGA and increased postural sway. Pt progresses to throwing with just RUE and requires minA and mod cuing for sequencing.   Pt ambulates 200' back to room with CGA. Left seated in chair with alarm intact and all needs within reach.  Therapy Documentation Precautions:  Precautions Precautions: Fall Precaution Comments: R Sided Neglect, impulsive at times Restrictions Weight Bearing Restrictions: No    Therapy/Group: Individual Therapy  Beau Fanny, PT, DPT 06/14/2019, 5:20 PM

## 2019-06-15 ENCOUNTER — Inpatient Hospital Stay (HOSPITAL_COMMUNITY): Payer: BC Managed Care – PPO | Admitting: Occupational Therapy

## 2019-06-15 ENCOUNTER — Inpatient Hospital Stay (HOSPITAL_COMMUNITY): Payer: BC Managed Care – PPO | Admitting: Speech Pathology

## 2019-06-15 ENCOUNTER — Inpatient Hospital Stay (HOSPITAL_COMMUNITY): Payer: BC Managed Care – PPO | Admitting: Physical Therapy

## 2019-06-15 DIAGNOSIS — I10 Essential (primary) hypertension: Secondary | ICD-10-CM

## 2019-06-15 DIAGNOSIS — R7303 Prediabetes: Secondary | ICD-10-CM

## 2019-06-15 DIAGNOSIS — I63512 Cerebral infarction due to unspecified occlusion or stenosis of left middle cerebral artery: Secondary | ICD-10-CM

## 2019-06-15 DIAGNOSIS — G479 Sleep disorder, unspecified: Secondary | ICD-10-CM

## 2019-06-15 NOTE — Progress Notes (Signed)
Speech Language Pathology Daily Session Note  Patient Details  Name: William Ellison MRN: [id number] Date of Birth: 1960-11-12  Today's Date: 06/15/2019 SLP Individual Time: 1031-1101 SLP Individual Time Calculation (min): 30 min  Short Term Goals: Week 1: SLP Short Term Goal 1 (Week 1): Pt will verbally respond to questions regarding his wants and needs with Max A cues for functional communication and word finding. SLP Short Term Goal 2 (Week 1): Pt will utilize gestures or a communication board to express basic wants and needs with Mod A cues. SLP Short Term Goal 3 (Week 1): Pt will generate names of items/objects during divergent naming tasks with Max A semantic/phonemic cues. SLP Short Term Goal 4 (Week 1): Pt will sustain attention to functional tasks for 10 minute intervals with Mod A cues for redirection.  Skilled Therapeutic Interventions: Pt was seen for skilled ST targeting communication goals. Pt was very lethargic throughout session, requiring consistent multimodal cues to maintain arousal and sustain attention to language tasks throughout session. Pt answered basic questions regarding his day and current status with 1-2 word answers (yes, no, good, etc.). During picture description tasks, pt occasionally able to produce sentence length utterances to describe place and actions within a scene, however Max A semantic and occasionally phonemic cues were required, and he mostly produced 1-2 word answers to very pointed questions about the pictures. Pt able to name objects within photos with ~70% accuracy. Pt eventually became too lethargic to continue session, therefore he missed 15 mins of skilled ST. Pt left laying in bed with alarm set and needs within reach. Continue per current plan of care.        Pain Pain Assessment Pain Scale: 0-10 Pain Score: 0-No pain  Therapy/Group: Individual Therapy  Little Ishikawa 06/15/2019, 10:32 AM

## 2019-06-15 NOTE — Progress Notes (Signed)
Occupational Therapy Session Note  Patient Details  Name: William Ellison MRN: [id number] Date of Birth: 1960-10-30  Today's Date: 06/15/2019 OT Individual Time: 0830-0900 OT Individual Time Calculation (min): 30 min   Patient missed 60 minutes 2nd session as he stated he was fatigued and not feeling wel   Short Term Goals: Week 1:  OT Short Term Goal 1 (Week 1): patient will complete bathing/shower with min A using ADs OT Short Term Goal 2 (Week 1): patient will complete dressing tasks with min A OT Short Term Goal 3 (Week 1): patient will complete funcitonal transfers and ambulation with LRAD with CGA and good awareness of right side OT Short Term Goal 4 (Week 1): patient will use right UE as functional assist for all self care tasks  Skilled Therapeutic Interventions/Progress Updates:      Therapy Documentation Precautions:  Precautions Precautions: Fall Precaution Comments: R Sided Neglect, impulsive at times Restrictions Weight Bearing Restrictions: No General: General OT Amount of Missed Time: 60 Minutes(also fatigued) Missed 2nd session   Pain: Pain Assessment Pain Scale: 0-10 Pain Score: 0-No pain ADL:   Therapy/Group: Individual Therapy  Bud Face [facility] 06/15/2019, 2:42 PM

## 2019-06-15 NOTE — Progress Notes (Signed)
Physical Therapy Session Note  Patient Details  Name: William Ellison MRN: [id number] Date of Birth: 1960/05/22  Today's Date: 06/15/2019 PT Individual Time: 2984-7308 PT Individual Time Calculation (min): 53 min   Short Term Goals: Week 1:  PT Short Term Goal 1 (Week 1): STG=LTG due to ELOS  Skilled Therapeutic Interventions/Progress Updates:   Pt received supine in bed, asleep, pt aroused with ease and agreeable to PT. Supine>sit transfer with supervision assist and cues for use of rail as needed. Stand pivot transfer to CGA. Pt performed facial hygiene performed at sink with min assist to attend to objects on the R. Pt transported to gym in [facility]. Gait training with CGA-supervision assist in gym with cues for obstacles on the R. Pt transported to entrance of [place]. Gait training over unlevel cement surface with supervision assist x 322f, [id number] and 1203fWith cues for awareness of obstacles on the R. Stair management training in community environment outside hospital with no UE support and supervision assist for safety. Pt reports need to urinate. Ambulatory transfer to urinal in public restroom in atrium with supervision assist. Pt able to performed clothing management and hand hygiene with cues for sequencing. Patient returned to room and left sitting in WC [facility] call [facility] in reach and all needs met.         Therapy Documentation Precautions:  Precautions Precautions: Fall Precaution Comments: R Sided Neglect, impulsive at times Restrictions Weight Bearing Restrictions: No    Vital Signs: Therapy Vitals Temp: 97.8 F (36.6 C) Pulse Rate: (!) 105 Resp: 18 BP: (!) 139/103 Patient Position (if appropriate): Lying Oxygen Therapy SpO2: 97 % O2 Device: Room Air Pain: Pain Assessment Pain Scale: 0-10 Pain Score: 0-No pain    Therapy/Group: Individual Therapy  AusLorie Phenix5/2021, 5:54 PM

## 2019-06-15 NOTE — Progress Notes (Signed)
William Ellison PHYSICAL MEDICINE & REHABILITATION PROGRESS NOTE   Subjective/Complaints: Patient seen laying in bed this morning.  He states he slept well overnight.  No reported issues overnight.  ROS: Appears to deny CP, shortness of breath, nausea, vomiting, diarrhea.  Objective:   No results found. Recent Labs    06/13/19 0545  WBC 6.1  HGB 15.1  HCT 45.1  PLT 305   Recent Labs    06/13/19 0545  NA 138  K 3.9  CL 101  CO2 26  GLUCOSE 90  BUN 10  CREATININE 1.13  CALCIUM 9.0   No intake or output data in the 24 hours ending 06/15/19 1103   Physical Exam: Vital Signs Blood pressure (!) 131/92, pulse 84, temperature 97.9 F (36.6 C), temperature source Oral, resp. rate 14, height 5\' 10"  (1.778 m), weight 134.4 kg, SpO2 100 %. Constitutional: No distress . Vital signs reviewed. HENT: Normocephalic.  Atraumatic. Eyes: Keeps eyes closed. No discharge. Cardiovascular: No JVD. Respiratory: Normal effort.  No stridor. GI: Non-distended. Skin: Warm and dry.  Intact. Psych: Normal mood.  Normal behavior. Musc: No edema in extremities.  No tenderness in extremities. Neurological: Alert Expressive >>receptive aphasia Motor: Appears to be 4+/5 throughout Right inattention  Assessment/Plan: 1. 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  Physiatrist is providing close team supervision and 24 hour management of active medical problems listed below.  Physiatrist and rehab team continue to assess barriers to discharge/monitor patient progress toward functional and medical goals  Care Tool:  Bathing    Body parts bathed by patient: Buttocks, Front perineal area, Right upper leg, Left upper leg   Body parts bathed by helper: Front perineal area, Buttocks, Right lower leg, Left lower leg, Right arm, Left arm     Bathing assist Assist  Level: Set up assist     Upper Body Dressing/Undressing Upper body dressing   What is the patient wearing?: Pull over shirt    Upper body assist Assist Level: Supervision/Verbal cueing    Lower Body Dressing/Undressing Lower body dressing      What is the patient wearing?: Pants     Lower body assist Assist for lower body dressing: Minimal Assistance - Patient > 75%     Toileting Toileting Toileting Activity did not occur and hygiene only): Refused  Toileting assist Assist for toileting: Minimal Assistance - Patient > 75%     Transfers Chair/bed transfer  Transfers assist     Chair/bed transfer assist level: Contact Guard/Touching assist     Locomotion Ambulation   Ambulation assist      Assist level: Minimal Assistance - Patient > 75% Assistive device: No Device Max distance: 300'   Walk 10 feet activity   Assist     Assist level: Contact Guard/Touching assist Assistive device: No Device   Walk 50 feet activity   Assist    Assist level: Minimal Assistance - Patient > 75% Assistive device: No Device    Walk 150 feet activity   Assist    Assist level: Minimal Assistance - Patient > 75% Assistive device: No Device    Walk 10 feet on uneven surface  activity   Assist     Assist level: Contact Guard/Touching assist     Wheelchair     Assist Will patient use wheelchair at discharge?: No  Wheelchair 50 feet with 2 turns activity    Assist            Wheelchair 150 feet activity     Assist          Blood pressure (!) 131/92, pulse 84, temperature 97.9 F (36.6 C), temperature source Oral, resp. rate 14, height 5\' 10"  (1.778 m), weight 134.4 kg, SpO2 100 %.  Medical Problem List and Plan: 1.  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  Continue CIR 2.  Antithrombotics: -DVT/anticoagulation:  Pharmaceutical:  Lovenox             -antiplatelet therapy: DAPT--ASA 325 mg/Plavix 75 mg.  3. Pain Management:   Tylenol prn 4. Mood: LCSW to follow for evaluation and support.              -antipsychotic agents: N/A 5. Neuropsych: This patient is not fully capable of making decisions on his own behalf. 6. Skin/Wound Care: Routine pressure relief  Measures.  7. Fluids/Electrolytes/Nutrition:  Monitor I/O.   8. HTN: Monitor BP   Continue Amlodipine daily. Continue to hold Lotensin/HCTZ--avoid hypotension.   Relatively controlled with slight elevation in diastolic BP on 6/5             Monitor with increased mobility 9. Dyslipidemia: On high dose statin.  10. Pre-diabetes: Hgb A1c- 6.2. Will have RD educate on CM diet.  Controlled on 6/3             Monitor with increased mobility 11. Morbid obesity: BMI 42.3.  Educate/encourage weight loss.  6/3- as per #10 12. Sleep disturbance  Trazodone d/ced  Improving   LOS: 3 days A FACE TO FACE EVALUATION WAS PERFORMED  William Ellison William Ellison 06/15/2019, 11:03 AM

## 2019-06-15 NOTE — Progress Notes (Signed)
Patient's dressing on chest area from loop recorder changed. Old dressing had some dried blood drainage.

## 2019-06-16 DIAGNOSIS — R0989 Other specified symptoms and signs involving the circulatory and respiratory systems: Secondary | ICD-10-CM

## 2019-06-16 NOTE — Progress Notes (Signed)
[place] PHYSICAL MEDICINE & REHABILITATION PROGRESS NOTE   Subjective/Complaints: Patient seen laying in bed this morning.  No reported issues overnight.  Wife states patient slept well overnight.  He is somnolent this morning.  ROS: Appears to deny CP, shortness of breath, nausea, vomiting, diarrhea.  Objective:   No results found. No results for input(s): WBC, HGB, HCT, PLT in the last 72 hours. No results for input(s): NA, K, CL, CO2, GLUCOSE, BUN, CREATININE, CALCIUM in the last 72 hours. No intake or output data in the 24 hours ending 06/16/19 1322   Physical Exam: Vital Signs Blood pressure (!) 153/92, pulse 64, temperature 98.5 F (36.9 C), resp. rate 18, height 5\' 10"  (1.778 m), weight 134.4 kg, SpO2 97 %.  Constitutional: No distress . Vital signs reviewed. HENT: Normocephalic.  Atraumatic. Eyes: EOMI. No discharge. Cardiovascular: No JVD. Respiratory: Normal effort.  No stridor. GI: Non-distended. Skin: Warm and dry.  Intact. Psych: Normal mood.  Normal behavior. Musc: No edema in extremities.  No tenderness in extremities. Neurological: Alert  Expressive >>receptive aphasia, unchanged Motor: Appears to be 4+/5 throughout Right inattention  Assessment/Plan: 1. 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  Physiatrist is providing close team supervision and 24 hour management of active medical problems listed below.  Physiatrist and rehab team continue to assess barriers to discharge/monitor patient progress toward functional and medical goals  Care Tool:  Bathing    Body parts bathed by patient: Buttocks, Front perineal area, Right upper leg, Left upper leg   Body parts bathed by helper: Front perineal area, Buttocks, Right lower leg, Left lower leg, Right arm, Left arm     Bathing assist Assist Level: Set up assist      Upper Body Dressing/Undressing Upper body dressing   What is the patient wearing?: Pull over shirt    Upper body assist Assist Level: Supervision/Verbal cueing    Lower Body Dressing/Undressing Lower body dressing      What is the patient wearing?: Pants     Lower body assist Assist for lower body dressing: Minimal Assistance - Patient > 75%     Toileting Toileting Toileting Activity did not occur Landscape architect and hygiene only): Refused  Toileting assist Assist for toileting: Minimal Assistance - Patient > 75%     Transfers Chair/bed transfer  Transfers assist     Chair/bed transfer assist level: Contact Guard/Touching assist     Locomotion Ambulation   Ambulation assist      Assist level: Minimal Assistance - Patient > 75% Assistive device: No Device Max distance: 300'   Walk 10 feet activity   Assist     Assist level: Contact Guard/Touching assist Assistive device: No Device   Walk 50 feet activity   Assist    Assist level: Minimal Assistance - Patient > 75% Assistive device: No Device    Walk 150 feet activity   Assist    Assist level: Minimal Assistance - Patient > 75% Assistive device: No Device    Walk 10 feet on uneven surface  activity   Assist     Assist level: Contact Guard/Touching assist     Wheelchair     Assist Will patient use wheelchair at discharge?: No             Wheelchair 50 feet with 2 turns activity    Assist  Wheelchair 150 feet activity     Assist          Blood pressure (!) 153/92, pulse 64, temperature 98.5 F (36.9 C), resp. rate 18, height 5\' 10"  (1.778 m), weight 134.4 kg, SpO2 97 %.  Medical Problem List and Plan: 1.  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  Continue CIR 2.  Antithrombotics: -DVT/anticoagulation:  Pharmaceutical: Lovenox             -antiplatelet therapy: DAPT--ASA 325  mg/Plavix 75 mg.  3. Pain Management:   Tylenol prn  Controlled on 6/6 4. Mood: LCSW to follow for evaluation and support.              -antipsychotic agents: N/A 5. Neuropsych: This patient is not fully capable of making decisions on his own behalf. 6. Skin/Wound Care: Routine pressure relief  Measures.  7. Fluids/Electrolytes/Nutrition:  Monitor I/O.   8. HTN: Monitor BP   Continue Amlodipine daily. Continue to hold Lotensin/HCTZ--avoid hypotension.   Labile on 6/6, monitor for trend             Monitor with increased mobility 9. Dyslipidemia: On high dose statin.  10. Pre-diabetes: Hgb A1c- 6.2. Will have RD educate on CM diet.  Controlled on 6/3             Monitor with increased mobility 11. Morbid obesity: BMI 42.3.  Educate/encourage weight loss.  6/3- as per #10 12. Sleep disturbance  Trazodone d/ced  Improved   LOS: 4 days A FACE TO FACE EVALUATION WAS PERFORMED  Devyn Griffing 8/3 06/16/2019, 1:22 PM

## 2019-06-16 NOTE — Plan of Care (Signed)
  Problem: RH BOWEL ELIMINATION Goal: RH STG MANAGE BOWEL WITH ASSISTANCE Description: STG Manage Bowel with min Assistance. Outcome: Progressing   Problem: RH BLADDER ELIMINATION Goal: RH STG MANAGE BLADDER WITH ASSISTANCE Description: STG Manage Bladder With min Assistance Outcome: Progressing   Problem: RH SKIN INTEGRITY Goal: RH STG SKIN FREE OF INFECTION/BREAKDOWN Description: Skin to be free of infection and breakdown with min assist at discharge from CIR. Outcome: Progressing

## 2019-06-17 ENCOUNTER — Inpatient Hospital Stay (HOSPITAL_COMMUNITY): Payer: BC Managed Care – PPO | Admitting: Speech Pathology

## 2019-06-17 ENCOUNTER — Inpatient Hospital Stay (HOSPITAL_COMMUNITY): Payer: BC Managed Care – PPO

## 2019-06-17 LAB — CBC
HCT: 46.8 % (ref 39.0–52.0)
Hemoglobin: 15.7 g/dL (ref 13.0–17.0)
MCH: 31.3 pg (ref 26.0–34.0)
MCHC: 33.5 g/dL (ref 30.0–36.0)
MCV: 93.4 fL (ref 80.0–100.0)
Platelets: 320 10*3/uL (ref 150–400)
RBC: 5.01 MIL/uL (ref 4.22–5.81)
RDW: 12.5 % (ref 11.5–15.5)
WBC: 5.9 10*3/uL (ref 4.0–10.5)
nRBC: 0 % (ref 0.0–0.2)

## 2019-06-17 LAB — BASIC METABOLIC PANEL
Anion gap: 9 (ref 5–15)
BUN: 11 mg/dL (ref 6–20)
CO2: 30 mmol/L (ref 22–32)
Calcium: 9.5 mg/dL (ref 8.9–10.3)
Chloride: 100 mmol/L (ref 98–111)
Creatinine, Ser: 1.26 mg/dL — ABNORMAL HIGH (ref 0.61–1.24)
GFR calc Af Amer: >60 mL/min (ref >60)
GFR calc non Af Amer: >60 mL/min (ref >60)
Glucose, Bld: 129 mg/dL — ABNORMAL HIGH (ref 70–99)
Potassium: 3.6 mmol/L (ref 3.5–5.1)
Sodium: 139 mmol/L (ref 135–145)

## 2019-06-17 MED ORDER — AMANTADINE HCL 100 MG PO CAPS
100.0000 mg | ORAL_CAPSULE | Freq: Every day | ORAL | Status: DC
  Administered 2019-06-18 – 2019-06-21 (×4): 100 mg via ORAL
  Filled 2019-06-17 (×4): qty 1

## 2019-06-17 NOTE — Progress Notes (Signed)
[place] PHYSICAL MEDICINE & REHABILITATION PROGRESS NOTE   Subjective/Complaints:  Pt reports no issues- LBM this AM- denies painn. Voiding OK- no issues  ROS:  Pt denies SOB, abd pain, CP, N/V/C/D, and vision changes  Objective:   No results found. Recent Labs    06/17/19 0849  WBC 5.9  HGB 15.7  HCT 46.8  PLT 320   Recent Labs    06/17/19 0849  NA 139  K 3.6  CL 100  CO2 30  GLUCOSE 129*  BUN 11  CREATININE 1.26*  CALCIUM 9.5    Intake/Output Summary (Last 24 hours) at 06/17/2019 1758 Last data filed at 06/16/2019 1842 Gross per 24 hour  Intake 240 ml  Output --  Net 240 ml     Physical Exam: Vital Signs Blood pressure 125/80, pulse 83, temperature 98.2 F (36.8 C), resp. rate 18, height 5\' 10"  (1.778 m), weight 134.4 kg, SpO2 94 %.  Constitutional: No distress . Vital signs reviewed. Laying supine in bed; appropriate, aphasia noted, NAD- family member in room/wife HENT: Normocephalic.  Atraumatic. Eyes: EOMI. No discharge. Cardiovascular: RRR Respiratory: CTA B/L- no W/R/R- good air movement GI: Soft, NT, ND, (+)BS  Skin: Warm and dry.  Intact. Psych: Normal mood.  Normal behavior. Musc: No edema in extremities.  No tenderness in extremities. Neurological: Alert  Expressive >>receptive aphasia, very slightly better from last week Motor: Appears to be 4+/5 throughout Right inattention  Assessment/Plan: 1. 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  Physiatrist is providing close team supervision and 24 hour management of active medical problems listed below.  Physiatrist and rehab team continue to assess barriers to discharge/monitor patient progress toward functional and medical goals  Care Tool:  Bathing    Body parts bathed by patient: Buttocks, Front perineal area, Right upper leg, Left upper leg   Body  parts bathed by helper: Front perineal area, Buttocks, Right lower leg, Left lower leg, Right arm, Left arm     Bathing assist Assist Level: Set up assist     Upper Body Dressing/Undressing Upper body dressing   What is the patient wearing?: Pull over shirt    Upper body assist Assist Level: Supervision/Verbal cueing    Lower Body Dressing/Undressing Lower body dressing      What is the patient wearing?: Pants     Lower body assist Assist for lower body dressing: Supervision/Verbal cueing     Toileting Toileting Toileting Activity did not occur (Clothing management and hygiene only): Refused  Toileting assist Assist for toileting: Minimal Assistance - Patient > 75%     Transfers Chair/bed transfer  Transfers assist     Chair/bed transfer assist level: Supervision/Verbal cueing     Locomotion Ambulation   Ambulation assist      Assist level: Supervision/Verbal cueing Assistive device: No Device Max distance: 150'   Walk 10 feet activity   Assist     Assist level: Supervision/Verbal cueing Assistive device: No Device   Walk 50 feet activity   Assist    Assist level: Supervision/Verbal cueing Assistive device: No Device    Walk 150 feet activity   Assist    Assist level: Supervision/Verbal cueing Assistive device: No Device    Walk 10 feet on uneven surface  activity   Assist     Assist level: Contact Guard/Touching assist     Wheelchair  Assist Will patient use wheelchair at discharge?: No             Wheelchair 50 feet with 2 turns activity    Assist            Wheelchair 150 feet activity     Assist          Blood pressure 125/80, pulse 83, temperature 98.2 F (36.8 C), resp. rate 18, height 5\' 10"  (1.778 m), weight 134.4 kg, SpO2 94 %.  Medical Problem List and Plan: 1.  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  Continue CIR 2.   Antithrombotics: -DVT/anticoagulation:  Pharmaceutical: Lovenox             -antiplatelet therapy: DAPT--ASA 325 mg/Plavix 75 mg.  3. Pain Management:   Tylenol prn  6/7- no pain 4. Mood: LCSW to follow for evaluation and support.              -antipsychotic agents: N/A 5. Neuropsych: This patient is not fully capable of making decisions on his own behalf. 6. Skin/Wound Care: Routine pressure relief  Measures.  7. Fluids/Electrolytes/Nutrition:  Monitor I/O.   8. HTN: Monitor BP   Continue Amlodipine daily. Continue to hold Lotensin/HCTZ--avoid hypotension.   Labile on 6/6, monitor for trend             Monitor with increased mobility 9. Dyslipidemia: On high dose statin.  10. Pre-diabetes: Hgb A1c- 6.2. Will have RD educate on CM diet.  Controlled on 6/3             Monitor with increased mobility 11. Morbid obesity: BMI 42.3.  Educate/encourage weight loss.  6/3- as per #10 12. Sleep disturbance  Trazodone d/ced  Improved 13. Aphasia  6/7- will try adding Amantadine 100 mg daily for speech recovery.    LOS: 5 days A FACE TO FACE EVALUATION WAS PERFORMED  Jolene Guyett 06/17/2019, 5:58 PM

## 2019-06-17 NOTE — Progress Notes (Signed)
Physical Therapy Session Note  Patient Details  Name: William Ellison MRN: [id number] Date of Birth: [birth year]  Today's Date: 06/17/2019 PT Individual Time: 2595-6387 PT Individual Time Calculation (min): 25 min  and Today's Date: 06/17/2019 PT Missed Time: 60 Minutes Missed Time Reason: Other (Comment)(sleeping)  Short Term Goals: Week 1:  PT Short Term Goal 1 (Week 1): STG=LTG due to ELOS  Skilled Therapeutic Interventions/Progress Updates:     Pt received supine in bed and agreeable to therapy. No report of pain. Pt performs supine to sit independently. Sit to stand with supervision for safety. Pt ambulates 150' without AD and supervision with verbal cues for increasing RLE step height to prevent toe drag, and increased cuing for R turns due to pt tendency to turn L regardless of cue given. Pt takes seated rest break and then performs dynamic gait training with obstacles, weaving in and out of cones, stepping over cones, and tapping cones with RLE, requiring CGA for safety and minimal verbal cues for correct performance of task. Pt perform NMR balance and cognitive training, given task of stepping toward colored circles on ground. Pt able to correctly identify and step toward circle ~75% of time but does require increased time and demonstrating decreased accuracy with fatigue. Pt requires CGA for safe balance during task. Pt ambulates 150' back to room with supervision and cues for head turns and maneuvering around obstacles in environment. Pt left seated in chair with alarm intact and all needs within reach.  Therapy Documentation Precautions:  Precautions Precautions: Fall Precaution Comments: R Sided Neglect, impulsive at times Restrictions Weight Bearing Restrictions: No   Therapy/Group: Individual Therapy  Beau Fanny, PT, DPT 06/17/2019, 3:57 PM

## 2019-06-17 NOTE — Progress Notes (Signed)
Occupational Therapy Session Note  Patient Details  Name: William Ellison MRN: [id number] Date of Birth: [birth year]  Today's Date: 06/17/2019 OT Individual Time: 2694-8546 OT Individual Time Calculation (min): 59 min    Short Term Goals: Week 1:  OT Short Term Goal 1 (Week 1): patient will complete bathing/shower with min A using ADs OT Short Term Goal 2 (Week 1): patient will complete dressing tasks with min A OT Short Term Goal 3 (Week 1): patient will complete funcitonal transfers and ambulation with LRAD with CGA and good awareness of right side OT Short Term Goal 4 (Week 1): patient will use right UE as functional assist for all self care tasks  Skilled Therapeutic Interventions/Progress Updates:    Patient seated in recliner, alert and ready for therapy session.  He denies pain.  Able to verbalize the month with increased time.  Able to write the current year.  Ongoing expressive difficulty but able to follow complex directions.  Ambulation to/from therapy treatment spaces with CS - one LOB able to regain without fall.  Completed box and blocks:  R = 30, L = 50                  9 hole peg:   R = 47 sec, L = 27 sec Dyn[employer]ision:  In stance, whole screen, 2 minutes trial 1 bilateral UEs:  1.43 sec                                                                                 Trial 2 L UE:  1.40 sec                                                                                  Trial 3 R UE:  1.62 sec  Did not note significant difference in right vs left side of screen in any of the trials Completed standing ball bounce/toss activity with mild difficulty. Provided word search activity book - practiced hand writing, letter id and word id - has in his room to complete at his leisure.  Returned to room, wife present - provided summary of session.  Patient returned to recliner, seat belt alarm set and call bell in reach.    Therapy Documentation Precautions:  Precautions Precautions:  Fall Precaution Comments: R Sided Neglect, impulsive at times Restrictions Weight Bearing Restrictions: No General:   Vital Signs:  Pain: Pain Assessment Pain Scale: 0-10 Pain Score: 0-No pain   Therapy/Group: Individual Therapy  Barrie Lyme 06/17/2019, 10:31 AM

## 2019-06-17 NOTE — Progress Notes (Signed)
Speech Language Pathology Daily Session Note  Patient Details  Name: William Ellison MRN: [id number] Date of Birth: 04/30/1960  Today's Date: 06/17/2019 SLP Individual Time: 1102-1200 SLP Individual Time Calculation (min): 58 min  Short Term Goals: Week 1: SLP Short Term Goal 1 (Week 1): Pt will verbally respond to questions regarding his wants and needs with Max A cues for functional communication and word finding. SLP Short Term Goal 2 (Week 1): Pt will utilize gestures or a communication board to express basic wants and needs with Mod A cues. SLP Short Term Goal 3 (Week 1): Pt will generate names of items/objects during divergent naming tasks with Max A semantic/phonemic cues. SLP Short Term Goal 4 (Week 1): Pt will sustain attention to functional tasks for 10 minute intervals with Mod A cues for redirection.  Skilled Therapeutic Interventions: Pt was seen for skilled ST targeting communication goals. SLP provided Mod A phonemic and semantic cues for word finding in order for pt to describe actions within photographs at the phrase and short sentence level. Intermittent orthographic cues utilized successfully in instances in which phonemic or semantic cues were not. During phrase closure tasks, pt continued to required Moderate assistance for word finding, however with heavier reliance on orthographic cues as opposed to phonemic or semantic. Overall Mod A feedback also required for pt to recognize verbal errors today. Due to RN's reports of pt attempting to get up and use restroom by himself, SLP provided Mod A verbal and visual cues for problem solving use of call bell with return demonstration. SLP also modified call bell to include "help" next to correct button to request assistance, given success with written cues to aid in communication today. Pt left sitting in recliner with alarm set and needs within reach. Continue per current plan of care.          Pain Pain Assessment Pain Scale:  0-10 Pain Score: 0-No pain  Therapy/Group: Individual Therapy  Little Ishikawa 06/17/2019, 7:16 AM

## 2019-06-18 ENCOUNTER — Inpatient Hospital Stay (HOSPITAL_COMMUNITY): Payer: BC Managed Care – PPO

## 2019-06-18 ENCOUNTER — Inpatient Hospital Stay (HOSPITAL_COMMUNITY): Payer: BC Managed Care – PPO | Admitting: Occupational Therapy

## 2019-06-18 NOTE — Progress Notes (Signed)
Patient bed alarm sounded off, upon entering room patient was found attempting to enter bathroom unassisted by staff, his SR were up x3 , bed alarm setting on middle indicator.Patient states he slid in between the side railing at the end of the bed to get up.This patient was encouraged and instructed by writer to use his call bell and to not get up unassisted, he responded with a head nod and stated he understood.Writer escorted patient into the BR,he appears unaware of safety concerns.Marland Kitchen   Marland Kitchen

## 2019-06-18 NOTE — Progress Notes (Signed)
Speech Language Pathology Daily Session Note  Patient Details  Name: William Ellison MRN: [id number] Date of Birth: 10/17/1960  Today's Date: 06/18/2019 SLP Individual Time: 8841-6606 SLP Individual Time Calculation (min): 43 min  Short Term Goals: Week 1: SLP Short Term Goal 1 (Week 1): Pt will verbally respond to questions regarding his wants and needs with Max A cues for functional communication and word finding. SLP Short Term Goal 2 (Week 1): Pt will utilize gestures or a communication board to express basic wants and needs with Mod A cues. SLP Short Term Goal 3 (Week 1): Pt will generate names of items/objects during divergent naming tasks with Max A semantic/phonemic cues. SLP Short Term Goal 4 (Week 1): Pt will sustain attention to functional tasks for 10 minute intervals with Mod A cues for redirection.  Skilled Therapeutic Interventions: Skilled ST services focused on cognitive skills. 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 Pt was unable to locate "tv" button on remote, SLP provided education, pt returned demonstration and then at the end of the session required x1 verbal cue, likely due to recall to locate "green button." Pt demonstrated moderate word finding deficits in causal conversation at the phrase level when pt provided work history. SLP facilitated cognitive skills focusing on basic problem solving, recall and sustained attention in basic money management task (ALFA), pt required initial mod A verbal cues fade to supervision A verbal cues for problem solving, redirect attention and recall with aid. SLP also facilitated mildly complex problem solving skills with medication management task (ALFA)  pt required max A verbal cues, likely due to reduced comprehension of complex language ( task 1 tablet daily four times day.) Pt appeared to have visual deficits, squinting eyes while reading labels and looking at coins, pt deines blurred vision but stated he does where reading glasses (current not at hospital), SLP instructed pt to request family to bring glasses. Pt also demonstrated intermittent reduce insight into cognitive deficits, when asked to rate performance following cognitive tasks, however was able to note word finding deficits.  SLP recommends to continue focusing on higher level (phrase) expressive and mildly complex receptive language skills, as well as cognitive skills in basic to mildly complex problem solving, recall with aid and sustained attention. Pt was left in room with call bell within reach and bed alarm set. ST recommends to continue skilled ST services.      Pain Pain Assessment Pain Score: 0-No pain  Therapy/Group: Individual Therapy  Jozeph Persing  [facility] 06/18/2019, 3:04 PM

## 2019-06-18 NOTE — Progress Notes (Signed)
Patient ID: William Ellison, male   DOB: Mar 18, 1960, 59 y.o.   MRN: [id number]  SW met with pt in room to provide updates from team conference, and d/c 06/21/2019. Pt aware SW to call s/o Colletta [place].  SW spoke with s/o Colletta [place] [phone])  to inform on above. She reports she can be here for family education tomorrow. SW encouarged her to look at pt schedule to see session times for tomorrow. SW infomred about recommendation of shower bench and OPT PT/OT/ST. SW informed referral will be sent to [facility]. SW informed not covered under insurance, and asked if SW will need to purchase. Reports she will follow-up if this item is needed.   Loralee Pacas, MSW, [place] Office: [phone] Cell: [phone] Fax: [phone]

## 2019-06-18 NOTE — Progress Notes (Signed)
[place] PHYSICAL MEDICINE & REHABILITATION PROGRESS NOTE   Subjective/Complaints:  Pt sleeping- on stomach- was able to wake up some- said no pain; wants to eat breakfast which was at bedside-  Denies any issues.  Per night nursing, getting up to bathroom, not calling and taking off safety belt- discussed with pt- explained has to stay in bed.   ROS:  Unable to assess due to sedation/aphasia  Objective:   No results found. Recent Labs    06/17/19 0849  WBC 5.9  HGB 15.7  HCT 46.8  PLT 320   Recent Labs    06/17/19 0849  NA 139  K 3.6  CL 100  CO2 30  GLUCOSE 129*  BUN 11  CREATININE 1.26*  CALCIUM 9.5   No intake or output data in the 24 hours ending 06/18/19 0919   Physical Exam: Vital Signs Blood pressure 117/79, pulse 79, temperature 98.7 F (37.1 C), temperature source Oral, resp. rate 18, height 5\' 10"  (1.778 m), weight 134.4 kg, SpO2 100 %.  Constitutional: No distress . Vital signs reviewed- pt sleeping on stomach- alone; impulsive- trying to turn over, NAD HENT: Normocephalic.  Atraumatic. Eyes: EOMI. No discharge. Cardiovascular: RRR Respiratory: CTA B/L- no W/R/R- good air movement GI: Soft, NT, ND, (+)BS  Skin: Warm and dry.  Intact. Psych: impulsive; sleepy. Musc: No edema in extremities.  No tenderness in extremities. Neurological: Alert  Expressive >>receptive aphasia, about the same as yesterday Motor: Appears to be 4+/5 throughout Right inattention- still notable  Assessment/Plan: 1. 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  Physiatrist is providing close team supervision and 24 hour management of active medical problems listed below.  Physiatrist and rehab team continue to assess barriers to discharge/monitor patient progress toward functional and medical goals  Care Tool:  Bathing    Body parts  bathed by patient: Buttocks, Front perineal area, Right upper leg, Left upper leg   Body parts bathed by helper: Front perineal area, Buttocks, Right lower leg, Left lower leg, Right arm, Left arm     Bathing assist Assist Level: Set up assist     Upper Body Dressing/Undressing Upper body dressing   What is the patient wearing?: Pull over shirt    Upper body assist Assist Level: Supervision/Verbal cueing    Lower Body Dressing/Undressing Lower body dressing      What is the patient wearing?: Pants     Lower body assist Assist for lower body dressing: Supervision/Verbal cueing     Toileting Toileting Toileting Activity did not occur (Clothing management and hygiene only): Refused  Toileting assist Assist for toileting: Minimal Assistance - Patient > 75%     Transfers Chair/bed transfer  Transfers assist     Chair/bed transfer assist level: Supervision/Verbal cueing     Locomotion Ambulation   Ambulation assist      Assist level: Supervision/Verbal cueing Assistive device: No Device Max distance: 150'   Walk 10 feet activity   Assist     Assist level: Supervision/Verbal cueing Assistive device: No Device   Walk 50 feet activity   Assist    Assist level: Supervision/Verbal cueing Assistive device: No Device    Walk 150 feet activity   Assist    Assist level: Supervision/Verbal cueing Assistive device: No Device    Walk 10 feet on uneven surface  activity   Assist  Assist level: Contact Guard/Touching assist     Wheelchair     Assist Will patient use wheelchair at discharge?: No             Wheelchair 50 feet with 2 turns activity    Assist            Wheelchair 150 feet activity     Assist          Blood pressure 117/79, pulse 79, temperature 98.7 F (37.1 C), temperature source Oral, resp. rate 18, height 5\' 10"  (1.778 m), weight 134.4 kg, SpO2 100 %.  Medical Problem List and Plan: 1.   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  Continue CIR 2.  Antithrombotics: -DVT/anticoagulation:  Pharmaceutical: Lovenox             -antiplatelet therapy: DAPT--ASA 325 mg/Plavix 75 mg.  3. Pain Management:   Tylenol prn  6/8- denies pain 4. Mood: LCSW to follow for evaluation and support.              -antipsychotic agents: N/A 5. Neuropsych: This patient is not fully capable of making decisions on his own behalf. 6. Skin/Wound Care: Routine pressure relief  Measures.  7. Fluids/Electrolytes/Nutrition:  Monitor I/O.   8. HTN: Monitor BP   Continue Amlodipine daily. Continue to hold Lotensin/HCTZ--avoid hypotension.   Labile on 6/6, monitor for trend  6/8- BP 117/79- - con't regimen             Monitor with increased mobility 9. Dyslipidemia: On high dose statin.  10. Pre-diabetes: Hgb A1c- 6.2. Will have RD educate on CM diet.  Controlled on 6/3             Monitor with increased mobility 11. Morbid obesity: BMI 42.3.  Educate/encourage weight loss.  6/3- as per #10 12. Sleep disturbance  Trazodone d/ced  Improved 13. Aphasia  6/7- will try adding Amantadine 100 mg daily for speech recovery.   6/8- haven't seen any change in sedation/speech changes as of yet.    LOS: 6 days A FACE TO FACE EVALUATION WAS PERFORMED  William Ellison 06/18/2019, 9:19 AM

## 2019-06-18 NOTE — Progress Notes (Signed)
Occupational Therapy Session Note  Patient Details  Name: William Ellison MRN: [id number] Date of Birth: 1960-07-28  Today's Date: 06/18/2019 OT Individual Time: 0905-1000 OT Individual Time Calculation (min): 55 min    Short Term Goals: Week 1:  OT Short Term Goal 1 (Week 1): patient will complete bathing/shower with min A using ADs OT Short Term Goal 2 (Week 1): patient will complete dressing tasks with min A OT Short Term Goal 3 (Week 1): patient will complete funcitonal transfers and ambulation with LRAD with CGA and good awareness of right side OT Short Term Goal 4 (Week 1): patient will use right UE as functional assist for all self care tasks  Skilled Therapeutic Interventions/Progress Updates:    Patient in bed, arouses with min stim.  He denies pain.  Bed mobility with CS.  SPT to/from bed, shower bench and ambulation in room without AD CS.  Completes shower with hand held shower head - CS level with cues for initiation, thoroughness and sequencing.  Dressing completed seated edge of bed with CS/set up.  Completed grooming tasks in stance at sink with CS and min cues, increased time.  Transfer to recliner with CS.  He is able to set up breakfast with CS.  He remained seated in recliner at close of session.  Seat belt alarm set, call bell in hand   Therapy Documentation Precautions:  Precautions Precautions: Fall Precaution Comments: R Sided Neglect, impulsive at times Restrictions Weight Bearing Restrictions: No   Therapy/Group: Individual Therapy  Barrie Lyme 06/18/2019, 7:34 AM

## 2019-06-18 NOTE — Progress Notes (Signed)
Physical Therapy Session Note  Patient Details  Name: William Ellison MRN: [id number] Date of Birth: [birth year]  Today's Date: 06/18/2019 PT Individual Time: 2951-8841 and 6606-3016 PT Individual Time Calculation (min): 41 min and 56 min  Short Term Goals: Week 1:  PT Short Term Goal 1 (Week 1): STG=LTG due to ELOS  Skilled Therapeutic Interventions/Progress Updates:     1st Session: Pt received seated in chair and agreeable to therapy. Sit to stand with supervision for safety. Pt ambulates 150' to therapy gym with close supervision and cues for positioning in hallway, sequencing during turns, and increasing R step height. Pt performs NMR for balance training, incorporating RUE movements. Pt initially shoots ball and basketball hoop with RUE, requiring CGA for balance and minA for R hand grip on ball. No LOBs during activity. Pt requires consistent cues to increase amplitude and force of RUE movement. Pt then performs throwing activity with trampoline, instructed to throw ball "as hard as you can" at trampoline with RUE. Pt catches rebound ~75% of time and has no LOBs. Pt then sits down and used 5lb bar in BUEs to hit ball back to PT, encouraging extension through trunk, balance reactions, and RUE involvement. Finally, pt performs targeted reaching with RUE and PT tossing ball, while pt is seated. Pt able to weight shift in all directions but has most difficulty reaching across body with RUE. Pt ambulates 150' back to room with superviision, and demos x1 instance of toe drag. Left seated in chair with alarm intact and all needs within reach.  2nd session: Pt received standing in room. PT educates on need to use call bell for staff prior to mobilizing. Pt verbalizes understanding. No report of pain. Pt ambulates 150' to dayroom with close supervision and cues to increase R step height. Pt performs NMR via Wii for standing balance and incorporating use of RUE. Pt able to successfully perform bowling  activity and attempts baseball bat swing as well, to incorporate reaction speed and timing. Pt has most difficulty with baseball game. No LOBs during activity. Pt ambulates 120' to therapy gym with close supervision. Performs biodex balance training to incorporate weight shifting and use of hip strategy for standing balance. Pt has tendency to use shoulders for weight shifts and requires frequent cues from PT to utilize hips. Pt ambulates 150' back to room with supervision, and has x1 instance of R toe drag. Pt left seated at EOB with alarm intact and all needs within reach.,  Therapy Documentation Precautions:  Precautions Precautions: Fall Precaution Comments: R Sided Neglect, impulsive at times Restrictions Weight Bearing Restrictions: No    Therapy/Group: Individual Therapy  Beau Fanny , PT, DPT 06/18/2019, 4:10 PM

## 2019-06-18 NOTE — Progress Notes (Signed)
[facility] was observed turning off bed alarm, he was instructed not to do this  for safety reasoning, also re-educated on department safety policy

## 2019-06-18 NOTE — Plan of Care (Signed)
°  Problem: RH Expression Communication Goal: LTG Patient will increase word finding of common (SLP) Description: LTG:  Patient will increase word finding of common objects/daily info/abstract thoughts with cues using compensatory strategies (SLP). 06/18/2019 1501 by Colin Benton, CCC-SLP Flowsheets (Taken 06/18/2019 1501) Patient will use compensatory strategies to increase word finding of: (specify level of word finding)  Daily info  Common objects Note: Specify level of word finding  06/18/2019 1501 by Colin Benton, CCC-SLP Flowsheets (Taken 06/18/2019 1501) Patient will use compensatory strategies to increase word finding of: (specify level of word finding)  Daily info  Common objects

## 2019-06-18 NOTE — Patient Care Conference (Signed)
Inpatient RehabilitationTeam Conference and Plan of Care Update Date: 06/18/2019   Time: 4:53 PM    Patient Name: William Ellison      Medical Record Number: [id number]  Date of Birth: [birth year] Sex: Male         Room/Bed: 4W26C/4W26C-01 Payor Info: Payor: BLUE CROSS BLUE [employer]IELD / Plan: BCBS COMM PPO / Product Type: *No Product type* /    Admit Date/Time:  06/12/2019  8:23 PM  Primary Diagnosis:  Ischemic stroke [facility])  Patient Active Problem List   Diagnosis Date Noted  . Labile blood pressure   . Sleep disturbance   . Acute ischemic left MCA stroke (HCC) 06/12/2019  . Morbid obesity (HCC)   . Dyslipidemia   . Ischemic stroke (HCC) L MCA embolic vs large vessle w/ L M1 occlusion    . Benign essential HTN   . Prediabetes   . Hypokalemia   . 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 Hypertensive urgency 06/25/2018  . Hypertension 06/25/2018  . Blood pressure elevated without history of HTN 06/20/2018  . Class 3 severe obesity due to excess calories with serious comorbidity and body mass index (BMI) of 40.0 to 44.9 in adult (HCC) 06/20/2018  . Erectile dysfunction 06/20/2018  . Skin rash 06/20/2018    Expected Discharge Date: Expected Discharge Date: 06/21/19  Team Members Present: Physician leading conference: Dr. Genice Rouge Care Coodinator Present: Cecile Sheerer, LCSWA;Other (comment)(Rubena Roseman Marlyne Beards, RN, BSN, CRRN) Nurse Present: Margot Ables, LPN PT Present: Malachi Pro, PT OT Present: Towanda Malkin, OT SLP Present: Colin Benton, SLP PPS Coordinator present : Fae Pippin, SLP     Current Status/Progress Goal Weekly Team Focus  Bowel/Bladder   Patient is continent of bladder/bowel,  Maintain conrinence  Assess QS/PRN address constipation   Swallow/Nutrition/ Hydration             ADL's   CS for bathing/dressing min cues for initiation, sequencing and thoroughness  CS/mod I  family education, discharge planning, R NMRE, balance, adl training   Mobility    independent bed mobility, supervision transfers, CGA ambulation 300'  independent to supervision goals  multitasking, high level balance training, DC planning   Communication   Mod A word finding  Min A  functional communication, word finding strategies, increasing length of utterances   Safety/Cognition/ Behavioral Observations  Min A  Supervision  Sustained attention and problem solving   Pain   Denies pain at this time  < 2, pain free  QS/PRN address discomfort or pain   Skin      Maintain skin intergity       Rehab Goals Patient on target to meet rehab goals: Yes *See Care Plan and progress notes for long and short-term goals.     Barriers to Discharge  Current Status/Progress Possible Resolutions Date Resolved   Nursing                  PT                    OT                  SLP                Care Coordinator                Discharge Planning/Teaching Needs:  D/c to home with 24/7 care from s/o [place]; support from various family members  Family education as recommended   Team Discussion:  Pt is very sleepy in the AM. Pt normally worked a 2nd shift job which may be the cause of his later morning sleep schedule. Needs cueing for initiation. Difficulty in right arm has made improvement since last week and he is using it more. Mod communication goals with min for cognition, Supervision for problem solving/attention. Continue to work on initiation and family education.  Revisions to Treatment Plan:  Attempt to change therapy schedule to begin later in the morning.    Medical Summary Current Status: very sleepy- no pain; incontnent? 1 A RW; works 2nd shift- 3pm-midnight Weekly Focus/Goal: OT- sound asleep- once moving; kept moving- no AD- close SBA; some cues for initiation/sequencing; took shower/dressed SBA with cues; high level coordination is good  Barriers to Discharge: Behavior;Home enviroment access/layout;Incontinence;Weight;Medication compliance  Barriers  to Discharge Comments: SLP-- from notes- mod A for communication- goals min A; cognition min A- goal Supervison- problems solving/attention- work on initiation/family ed- d/c 6/11 Possible Resolutions to Barriers: PT- awake- attention is good; walking well close SBA- can catch R toe occ; RUE is the worst- balance is good; using RUE more   Continued Need for Acute Rehabilitation Level of Care: The patient requires daily medical management by a physician with specialized training in physical medicine and rehabilitation for the following reasons: Direction of a multidisciplinary physical rehabilitation program to maximize functional independence : Yes Medical management of patient stability for increased activity during participation in an intensive rehabilitation regime.: Yes Analysis of laboratory values and/or radiology reports with any subsequent need for medication adjustment and/or medical intervention. : Yes   I attest that I was present, lead the team conference, and concur with the assessment and plan of the team.   Cristi Loron 06/18/2019, 4:53 PM

## 2019-06-19 ENCOUNTER — Inpatient Hospital Stay (HOSPITAL_COMMUNITY): Payer: BC Managed Care – PPO

## 2019-06-19 ENCOUNTER — Inpatient Hospital Stay (HOSPITAL_COMMUNITY): Payer: BC Managed Care – PPO | Admitting: Speech Pathology

## 2019-06-19 NOTE — Progress Notes (Signed)
Patient ID: Bolivar Koranda, male   DOB: 02-May-1960, 59 y.o.   MRN: [id number]  SW followed up with pt wife Judeth Cornfield to discuss purchasing shower bench. States she will ask a family member. She also mentioned challenges with transportation as he was driving her car when he suffered a stroke. SW encouraged being here tomorrow for family education if she is able too. SW informed on [facility] following up about appointment.   Cecile Sheerer, MSW, LCSWA Office: [phone] Cell: [phone] Fax: [phone]

## 2019-06-19 NOTE — Progress Notes (Signed)
Speech Language Pathology Daily Session Note  Patient Details  Name: William Ellison MRN: [id number] Date of Birth: November 10, 1960  Today's Date: 06/19/2019 SLP Individual Time: 7616-0737 SLP Individual Time Calculation (min): 58 min  Short Term Goals: Week 1: SLP Short Term Goal 1 (Week 1): Pt will verbally respond to questions regarding his wants and needs with Max A cues for functional communication and word finding. SLP Short Term Goal 2 (Week 1): Pt will utilize gestures or a communication board to express basic wants and needs with Mod A cues. SLP Short Term Goal 3 (Week 1): Pt will generate names of items/objects during divergent naming tasks with Max A semantic/phonemic cues. SLP Short Term Goal 4 (Week 1): Pt will sustain attention to functional tasks for 10 minute intervals with Mod A cues for redirection.  Skilled Therapeutic Interventions: Pt was seen for skilled ST targeting cognitive linguistic goals. Pt easily aroused from sleep today to sit at the edge of the bed and participate in ST. He recalled general events of last ST session with Min A question cues. During a basic 3-step action card sequencing task, pt demonstated ability to problem solve with Supervision A verbal cues. He also engaged in a semi-complex money scenario with Min A verbal cues for problem solving and use of verbal repetition strategy for recall and comprehension of more complex language. Pt demonstrated ability to functionally communicate wants, needs, express himself, and describe pictures throughout tasks with only Min A cues for word finding strategies and semantic prompts. Pt sustained attention throughout session with only Supervision A verbal cues for redirection. Pt left sitting edge of bed with alarm set and needs within reach. Of note, SLP planned to complete family education with pt's wife today, however she was note present. Spoke with pt's Child psychotherapist and regarding rescheduling - will re-attempt family  education tomorrow. Continue per current plan of care.          Pain Pain Assessment Pain Scale: 0-10 Pain Score: 0-No pain  Therapy/Group: Individual Therapy  Little Ishikawa 06/19/2019, 7:04 AM

## 2019-06-19 NOTE — Progress Notes (Signed)
[facility] PHYSICAL MEDICINE & REHABILITATION PROGRESS NOTE   Subjective/Complaints:  Pt asleep on side- woke to stimuli- had eaten breakfast this AM already- for first time.   Says has had BM's  And denies constipation.     ROS: unable to assess due to aphasia/sedation  Objective:   No results found. Recent Labs    06/17/19 0849  WBC 5.9  HGB 15.7  HCT 46.8  PLT 320   Recent Labs    06/17/19 0849  NA 139  K 3.6  CL 100  CO2 30  GLUCOSE 129*  BUN 11  CREATININE 1.26*  CALCIUM 9.5    Intake/Output Summary (Last 24 hours) at 06/19/2019 0857 Last data filed at 06/18/2019 1300 Gross per 24 hour  Intake 444 ml  Output --  Net 444 ml     Physical Exam: Vital Signs Blood pressure 137/86, pulse 69, temperature 98.2 F (36.8 C), resp. rate 18, height 5\' 10"  (1.778 m), weight 134.4 kg, SpO2 98 %.  Constitutional: No distress . Sleeping on side; woke to stimuli for a few minutes, NAD HENT: Normocephalic.  Atraumatic. Eyes: EOMI. No discharge. Cardiovascular: RRR Respiratory: .mnlr GI: Soft, NT, ND, (+)BS  Skin: Warm and dry.  Intact. Psych: impulsive; sleepy. Musc: No edema in extremities.  No tenderness in extremities. Neurological: sleepy Expressive >>receptive aphasia, no significant change Motor: Appears to be 4+/5 throughout Right inattention- still notable on exam  Assessment/Plan: 1. 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  Physiatrist is providing close team supervision and 24 hour management of active medical problems listed below.  Physiatrist and rehab team continue to assess barriers to discharge/monitor patient progress toward functional and medical goals  Care Tool:  Bathing    Body parts bathed by patient: Buttocks, Front perineal area, Right upper leg, Left upper leg, Right arm, Left arm, Chest, Abdomen,  Right lower leg, Left lower leg, Face   Body parts bathed by helper: Front perineal area, Buttocks, Right lower leg, Left lower leg, Right arm, Left arm     Bathing assist Assist Level: Supervision/Verbal cueing     Upper Body Dressing/Undressing Upper body dressing   What is the patient wearing?: Pull over shirt    Upper body assist Assist Level: Supervision/Verbal cueing    Lower Body Dressing/Undressing Lower body dressing      What is the patient wearing?: Pants, Underwear/pull up     Lower body assist Assist for lower body dressing: Supervision/Verbal cueing     Toileting Toileting Toileting Activity did not occur (Clothing management and hygiene only): Refused  Toileting assist Assist for toileting: Supervision/Verbal cueing     Transfers Chair/bed transfer  Transfers assist     Chair/bed transfer assist level: Supervision/Verbal cueing     Locomotion Ambulation   Ambulation assist      Assist level: Supervision/Verbal cueing Assistive device: No Device Max distance: 150'   Walk 10 feet activity   Assist     Assist level: Supervision/Verbal cueing Assistive device: No Device   Walk 50 feet activity   Assist    Assist level: Supervision/Verbal cueing Assistive device: No Device    Walk 150 feet activity   Assist    Assist level: Supervision/Verbal cueing Assistive device: No Device    Walk 10 feet on uneven surface  activity   Assist     Assist level: Contact Guard/Touching assist  Wheelchair     Assist Will patient use wheelchair at discharge?: No             Wheelchair 50 feet with 2 turns activity    Assist            Wheelchair 150 feet activity     Assist          Blood pressure 137/86, pulse 69, temperature 98.2 F (36.8 C), resp. rate 18, height 5\' 10"  (1.778 m), weight 134.4 kg, SpO2 98 %.  Medical Problem List and Plan: 1.  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  Continue CIR 2.  Antithrombotics: -DVT/anticoagulation:  Pharmaceutical: Lovenox             -antiplatelet therapy: DAPT--ASA 325 mg/Plavix 75 mg.  3. Pain Management:   Tylenol prn  6/8- denies pain 4. Mood: LCSW to follow for evaluation and support.              -antipsychotic agents: N/A 5. Neuropsych: This patient is not fully capable of making decisions on his own behalf. 6. Skin/Wound Care: Routine pressure relief  Measures.  7. Fluids/Electrolytes/Nutrition:  Monitor I/O.   8. HTN: Monitor BP   Continue Amlodipine daily. Continue to hold Lotensin/HCTZ--avoid hypotension.   Labile on 6/6, monitor for trend  6/8- BP 137/86- con't regimen             Monitor with increased mobility 9. Dyslipidemia: On high dose statin.  10. Pre-diabetes: Hgb A1c- 6.2. Will have RD educate on CM diet.  Controlled on 6/3             Monitor with increased mobility 11. Morbid obesity: BMI 42.3.  Educate/encourage weight loss.  6/3- as per #10 12. Sleep disturbance  Trazodone d/ced  6/9- worked 2nd shift- so 3pm to midnight, so usually sleeps late- asked PT/OT to schedule therapy later.   Improved 13. Aphasia  6/7- will try adding Amantadine 100 mg daily for speech recovery.   6/8- haven't seen any change in sedation/speech changes as of yet.   6/9- won't fix sedation due to 2nd shift work- will monitor speech   LOS: 7 days A FACE TO FACE EVALUATION WAS PERFORMED  Twylia Oka 06/19/2019, 8:57 AM

## 2019-06-19 NOTE — Progress Notes (Signed)
Physical Therapy Session Note  Patient Details  Name: William Ellison MRN: [id number] Date of Birth: 08/02/1960  Today's Date: 06/19/2019 PT Individual Time: 1136-1200 and 2633-3545 PT Individual Time Calculation (min): 24 min and 58 min  Short Term Goals: Week 1:  PT Short Term Goal 1 (Week 1): STG=LTG due to ELOS  Skilled Therapeutic Interventions/Progress Updates:     1st session: Pt received supine in bed and agreeable to therapy. Supine to sit independently. Sit to stand with supervision for safety. Pt ambulates 200' with close supervision and no assistive device. PT provides cues for gait challenges, including head turns horizontally and vertically, spontaneous stopping, velocity changes, sudden pivots, and marching with high knees. Pt performs without LOB but has most difficulty with turning 180 degrees, requiring increased cuing to perform safely.   Pt performs "boxing" activity to challenge dynamic balance and work on greater amplitude, more forceful movements with RUE. Initially pt punches punching bag but transitions to targeted punching of PT's hands. Pt demos more difficulty when punching with RUE. No LOBs. 150' ambulation back to room with close supervision. Pt left seated at EOB with alarm intact and all needs within reach.  2nd session: Pt received seated and agreeable to therapy. No report of pain. Pt ambulates 150' with close supervision. Pt performs Nustep for endurance training and reciprocal coordination training. x14 minutes total at workload of 5 with 3 rest breaks. Pt performs total of 1500 steps and does not demo any loss of grip with RUE.   Pt then performs standing balance activity with Wii bowling with close supervision and cues from PT to increase step with LLE to challenge dynamic balance. Pt has improved use of R hand and direction following relative to previous session. Pt ambulates 150' back to room with supervision. Left seated at EOB with alarm intact and all  needs within reach.  Therapy Documentation Precautions:  Precautions Precautions: Fall Precaution Comments: R Sided Neglect, impulsive at times Restrictions Weight Bearing Restrictions: No    Therapy/Group: Individual Therapy  Beau Fanny , PT, DPT 06/19/2019, 3:39 PM

## 2019-06-19 NOTE — Progress Notes (Signed)
Occupational Therapy Session Note  Patient Details  Name: William Ellison MRN: [id number] Date of Birth: 12-05-1960  Today's Date: 06/19/2019 OT Individual Time: 6333-5456 OT Individual Time Calculation (min): 54 min    Short Term Goals: Week 1:  OT Short Term Goal 1 (Week 1): patient will complete bathing/shower with min A using ADs OT Short Term Goal 2 (Week 1): patient will complete dressing tasks with min A OT Short Term Goal 3 (Week 1): patient will complete funcitonal transfers and ambulation with LRAD with CGA and good awareness of right side OT Short Term Goal 4 (Week 1): patient will use right UE as functional assist for all self care tasks  Skilled Therapeutic Interventions/Progress Updates:    1:1 Pt received in bed asleep, easily aroused but needs gentle coaxing to have pt initate bed mobility to get started with tx session. Pt reportedly not a morning person. Pt completes all mobility and ADLs at ambulatory level sit to stand in shower for bathing, dressing EOB and grooming standing at sink with S and VC for safety awareness. Pt slow to initate all tasks ?lethargy v decreased initiation. Pt continues to try to elevate 1LE on tray table bottom which rolls requiring direct VC to terminate as table rolling out from under foot. Pt completes functional mobility in the hallway to/from RN station to gather milk and spoon needed to eat cereal at EOB. Pt with 1 instance of RUE not clearing floor and CGA for OT to correct trip. Pt misses RN station on R, requires cuing to located microwave on R and bumps into doorway on R d/t R inattention. Pt completes eating cereal EOB with set up and is able to manage all packages and containers without A. Exited session with pt seated inbed, exit alarm on and call light in reach  Therapy Documentation Precautions:  Precautions Precautions: Fall Precaution Comments: R Sided Neglect, impulsive at times Restrictions Weight Bearing Restrictions:  No General:   Vital Signs:   Pain:   ADL: ADL Eating: Minimal assistance Where Assessed-Eating: Wheelchair Grooming: Minimal assistance Where Assessed-Grooming: Sitting at sink, Wheelchair Upper Body Bathing: Moderate assistance Where Assessed-Upper Body Bathing: Sitting at sink, Wheelchair Lower Body Bathing: Moderate assistance Where Assessed-Lower Body Bathing: Sitting at sink, Wheelchair Upper Body Dressing: Moderate assistance Where Assessed-Upper Body Dressing: Wheelchair Lower Body Dressing: Maximal assistance Where Assessed-Lower Body Dressing: Wheelchair Toileting: Moderate assistance(urgent use of urinal) Vision   Perception    Praxis   Exercises:   Other Treatments:     Therapy/Group: Individual Therapy  Shon Hale 06/19/2019, 10:45 AM

## 2019-06-20 ENCOUNTER — Inpatient Hospital Stay (HOSPITAL_COMMUNITY): Payer: BC Managed Care – PPO | Admitting: Speech Pathology

## 2019-06-20 ENCOUNTER — Inpatient Hospital Stay (HOSPITAL_COMMUNITY): Payer: BC Managed Care – PPO | Admitting: Occupational Therapy

## 2019-06-20 ENCOUNTER — Inpatient Hospital Stay (HOSPITAL_COMMUNITY): Payer: BC Managed Care – PPO

## 2019-06-20 NOTE — Plan of Care (Signed)
  Problem: RH BOWEL ELIMINATION Goal: RH STG MANAGE BOWEL WITH ASSISTANCE Description: STG Manage Bowel with min Assistance. Outcome: Progressing   Problem: RH BLADDER ELIMINATION Goal: RH STG MANAGE BLADDER WITH ASSISTANCE Description: STG Manage Bladder With min Assistance Outcome: Progressing   Problem: RH SKIN INTEGRITY Goal: RH STG SKIN FREE OF INFECTION/BREAKDOWN Description: Skin to be free of infection and breakdown with min assist at discharge from CIR. Outcome: Progressing   

## 2019-06-20 NOTE — Progress Notes (Signed)
Occupational Therapy Discharge Summary  Patient Details  Name: William Ellison MRN: [id number] Date of Birth: 07-17-1960  Today's Date: 06/20/2019 OT Individual Time: 1000-1100 OT Individual Time Calculation (min): 60 min   Patient in bed, asleep but easy to arouse for therapy session.  Functional ambulation in room and on unit with CS.  Shower / ADL performed as documented below.  Completed re-assessment as documented below.  He has improved in all functional areas - continues with expressive impairment.  Recommend 24 hour close supervision upon return home Patient has met 13 of 13 long term goals due to improved activity tolerance, improved balance, functional use of  RIGHT upper and RIGHT lower extremity, improved attention, improved awareness and improved coordination.  Patient to discharge at overall Supervision level.  Patient's care partner is independent to provide the necessary physical and cognitive assistance at discharge.    Reasons goals not met: na  Recommendation:  Patient will benefit from ongoing skilled OT services in outpatient setting to continue to advance functional skills in the area of BADL, iADL and Vocation.  Equipment: wife plans to purchase tub bench   Reasons for discharge: treatment goals met  Patient/family agrees with progress made and goals achieved: Yes  OT Discharge Precautions/Restrictions  Precautions Precautions: Fall Restrictions Weight Bearing Restrictions: No General   Vital Signs Therapy Vitals Temp: 98.4 F (36.9 C) Temp Source: Oral Pulse Rate: 80 Resp: 17 BP: 132/88 Patient Position (if appropriate): Sitting Oxygen Therapy SpO2: 100 % O2 Device: Room Air Pain Pain Assessment Pain Scale: 0-10 Pain Score: 0-No pain ADL ADL Eating: Independent Where Assessed-Eating: Chair Grooming: Independent Where Assessed-Grooming: Sitting at sink Upper Body Bathing: Supervision/safety Where Assessed-Upper Body Bathing: Shower Lower  Body Bathing: Supervision/safety Where Assessed-Lower Body Bathing: Shower Upper Body Dressing: Setup Where Assessed-Upper Body Dressing: Edge of bed Lower Body Dressing: Supervision/safety Where Assessed-Lower Body Dressing: Edge of bed Toileting: Supervision/safety Where Assessed-Toileting: Glass blower/designer: Close supervision Toilet Transfer Method: Human resources officer: Close supervison Clinical cytogeneticist Method: Optometrist: Facilities manager: Close supervision Social research officer, government Method: Heritage manager: Gaffer Baseline Vision/History: No visual deficits Vision Assessment?: No apparent visual deficits Saccades: Within functional limits Perception  Perception: Within Functional Limits Inattention/Neglect: Other (comment) (occ bumping into obstacles on right side when distracted) Praxis Praxis: Intact Cognition Overall Cognitive Status: Impaired/Different from baseline Arousal/Alertness: Awake/alert Orientation Level: Oriented X4 Sustained Attention: Appears intact Behaviors: Impulsive Safety/Judgment: Impaired Comments: no unsafe behaviors observed in controlled environment Sensation Sensation Light Touch: Appears Intact Hot/Cold: Appears Intact Proprioception: Appears Intact Coordination Gross Motor Movements are Fluid and Coordinated: Yes Fine Motor Movements are Fluid and Coordinated: No Finger Nose Finger Test: intact 9 Hole Peg Test: L = 21 sec, R = 39 sec         box and blocks:   L = 57, R = 32 Motor  Motor Motor: Hemiplegia Motor - Skilled Clinical Observations: RUE weakness Motor - Discharge Observations: improving right side strength/coordination and awareness Mobility  Bed Mobility Bed Mobility: Supine to Sit;Sit to Supine Supine to Sit: Independent Sit to Supine: Independent Transfers Sit to Stand: Independent Stand to Sit: Independent   Trunk/Postural Assessment  Cervical Assessment Cervical Assessment: Within Functional Limits Thoracic Assessment Thoracic Assessment: Within Functional Limits Lumbar Assessment Lumbar Assessment: Within Functional Limits Postural Control Postural Control: Within Functional Limits  Balance Balance Balance Assessed: Yes Standardized Balance Assessment Standardized Balance Assessment: Berg Balance Test Berg Balance Test Sit to  Stand: Able to stand without using hands and stabilize independently Standing Unsupported: Able to stand safely 2 minutes Sitting with Back Unsupported but Feet Supported on Floor or Stool: Able to sit safely and securely 2 minutes Stand to Sit: Sits safely with minimal use of hands Transfers: Able to transfer safely, minor use of hands Standing Unsupported with Eyes Closed: Able to stand 10 seconds with supervision Standing Ubsupported with Feet Together: Able to place feet together independently and stand 1 minute safely From Standing, Reach Forward with Outstretched Arm: Can reach forward >12 cm safely (5") From Standing Position, Pick up Object from Floor: Able to pick up shoe, needs supervision From Standing Position, Turn to Look Behind Over each Shoulder: Looks behind from both sides and weight shifts well Turn 360 Degrees: Able to turn 360 degrees safely in 4 seconds or less Standing Unsupported, Alternately Place Feet on Step/Stool: Able to stand independently and safely and complete 8 steps in 20 seconds Standing Unsupported, One Foot in Front: Able to place foot tandem independently and hold 30 seconds Standing on One Leg: Able to lift leg independently and hold 5-10 seconds Total Score: 52 Static Sitting Balance Static Sitting - Balance Support: No upper extremity supported Static Sitting - Level of Assistance: 7: Independent Dynamic Sitting Balance Dynamic Sitting - Balance Support: No upper extremity supported;During functional activity Dynamic  Sitting - Level of Assistance: 7: Independent Static Standing Balance Static Standing - Balance Support: No upper extremity supported Static Standing - Level of Assistance: 7: Independent Dynamic Standing Balance Dynamic Standing - Balance Support: No upper extremity supported Dynamic Standing - Level of Assistance: 5: Stand by assistance Extremity/Trunk Assessment RUE Assessment Passive Range of Motion (PROM) Comments: WFL Active Range of Motion (AROM) Comments: WFL t/o General Strength Comments: 5/5   grip strength = 90# LUE Assessment LUE Assessment: Within Functional Limits General Strength Comments: grip strength 95#   Erline Levine A Christian Borgerding 06/20/2019, 4:40 PM

## 2019-06-20 NOTE — Progress Notes (Signed)
[place] PHYSICAL MEDICINE & REHABILITATION PROGRESS NOTE   Subjective/Complaints:   Pt reports did family education with wife yesterday- denies issues.  Wants to go back to sleep.   Says is having BM's- last BM DOCUMENTED is 6/7 and was small, but nursing have showed pt getting up to toile ton his own without calling even though been counseled not it.     ROS:  Pt denies SOB, abd pain, CP, N/V/C/D, and vision changes   Objective:   No results found. No results for input(s): WBC, HGB, HCT, PLT in the last 72 hours. No results for input(s): NA, K, CL, CO2, GLUCOSE, BUN, CREATININE, CALCIUM in the last 72 hours.  Intake/Output Summary (Last 24 hours) at 06/20/2019 0909 Last data filed at 06/19/2019 1700 Gross per 24 hour  Intake 240 ml  Output --  Net 240 ml     Physical Exam: Vital Signs Blood pressure (!) 135/92, pulse 67, temperature 98 F (36.7 C), temperature source Oral, resp. rate 18, height 5\' 10"  (1.778 m), weight 134.4 kg, SpO2 99 %.  Constitutional: No distress . Sleeping on L side in bed; NAD HENT: Normocephalic.  Atraumatic. Eyes: EOMI. No discharge. Cardiovascular: RRR Respiratory: CTA B/L- no W/R/R- good air movement GI: Soft, NT, ND, (+)BS  Skin: Warm and dry.  Intact. Psych: impulsive; sleepy. Musc: No edema in extremities.  No tenderness in extremities. Neurological: sleepy Expressive >>receptive aphasia, no significant change Motor: Appears to be 4+/5 throughout Right inattention- still notable on exam  Assessment/Plan: 1. 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  Physiatrist is providing close team supervision and 24 hour management of active medical problems listed below.  Physiatrist and rehab team continue to assess barriers to discharge/monitor patient progress toward functional and medical goals  Care  Tool:  Bathing    Body parts bathed by patient: Buttocks, Front perineal area, Right upper leg, Left upper leg, Right arm, Left arm, Chest, Abdomen, Right lower leg, Left lower leg, Face   Body parts bathed by helper: Front perineal area, Buttocks, Right lower leg, Left lower leg, Right arm, Left arm     Bathing assist Assist Level: Supervision/Verbal cueing     Upper Body Dressing/Undressing Upper body dressing   What is the patient wearing?: Pull over shirt    Upper body assist Assist Level: Supervision/Verbal cueing    Lower Body Dressing/Undressing Lower body dressing      What is the patient wearing?: Pants, Underwear/pull up     Lower body assist Assist for lower body dressing: Supervision/Verbal cueing     Toileting Toileting Toileting Activity did not occur (Clothing management and hygiene only): Refused  Toileting assist Assist for toileting: Supervision/Verbal cueing     Transfers Chair/bed transfer  Transfers assist     Chair/bed transfer assist level: Supervision/Verbal cueing     Locomotion Ambulation   Ambulation assist      Assist level: Supervision/Verbal cueing Assistive device: No Device Max distance: 200'   Walk 10 feet activity   Assist     Assist level: Supervision/Verbal cueing Assistive device: No Device   Walk 50 feet activity   Assist    Assist level: Supervision/Verbal cueing Assistive device: No Device    Walk 150 feet activity   Assist    Assist level: Supervision/Verbal cueing Assistive device: No Device    Walk 10 feet on uneven surface  activity   Assist     Assist level: Contact Guard/Touching assist     Wheelchair     Assist Will patient use wheelchair at discharge?: No             Wheelchair 50 feet with 2 turns activity    Assist            Wheelchair 150 feet activity     Assist          Blood pressure (!) 135/92, pulse 67, temperature 98 F (36.7 C),  temperature source Oral, resp. rate 18, height 5\' 10"  (1.778 m), weight 134.4 kg, SpO2 99 %.  Medical Problem List and Plan: 1.  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  Continue CIR 2.  Antithrombotics: -DVT/anticoagulation:  Pharmaceutical: Lovenox             -antiplatelet therapy: DAPT--ASA 325 mg/Plavix 75 mg.  3. Pain Management:   Tylenol prn  6/10- denies pain 4. Mood: LCSW to follow for evaluation and support.              -antipsychotic agents: N/A 5. Neuropsych: This patient is not fully capable of making decisions on his own behalf. 6. Skin/Wound Care: Routine pressure relief  Measures.  7. Fluids/Electrolytes/Nutrition:  Monitor I/O.   8. HTN: Monitor BP   Continue Amlodipine daily. Continue to hold Lotensin/HCTZ--avoid hypotension.   Labile on 6/6, monitor for trend  6/8- BP 137/86- con't regimen  6/10- BP 132/90- don't want BP to drop, and systolic is well controlled- won't add additional meds at this point.              Monitor with increased mobility 9. Dyslipidemia: On high dose statin.  10. Pre-diabetes: Hgb A1c- 6.2. Will have RD educate on CM diet.  Controlled on 6/3  6/10- BG 129 on last BMP- doesn't want intervention             Monitor with increased mobility 11. Morbid obesity: BMI 42.3.  Educate/encourage weight loss.  6/3- as per #10 12. Sleep disturbance  Trazodone d/ced  6/9- worked 2nd shift- so 3pm to midnight, so usually sleeps late- asked PT/OT to schedule therapy later.   Improved 13. Aphasia  6/7- will try adding Amantadine 100 mg daily for speech recovery.   6/8- haven't seen any change in sedation/speech changes as of yet.   6/9- won't fix sedation due to 2nd shift work- will monitor speech   LOS: 8 days A FACE TO FACE EVALUATION WAS PERFORMED  William Ellison 06/20/2019, 9:09 AM

## 2019-06-20 NOTE — Progress Notes (Signed)
Physical Therapy Discharge Summary  Patient Details  Name: William Ellison MRN: [id number] Date of Birth: 12-14-1960  Today's Date: 06/20/2019 PT Individual Time: 1420-1515 PT Individual Time Calculation (min): 55 min    Patient has met 8 of 8 long term goals due to improved balance, improved attention, improved awareness and improved coordination.  Patient to discharge at an ambulatory level Supervision.   Patient's care partner is independent to provide the necessary cognitive assistance at discharge.  Reasons goals not met: NA  Recommendation:  Patient will benefit from ongoing skilled PT services in outpatient setting to continue to advance safe functional mobility, address ongoing impairments in high level balance, coordination, and minimize fall risk.  Equipment: No equipment provided  Reasons for discharge: treatment goals met and discharge from hospital  Patient/family agrees with progress made and goals achieved: Yes   Skilled Therapeutic Interventions: Pt received supine in bed and agreeable to therapy. No report of pain. Supine to sit independently. Sit to stand independently and pt ambulates 300'+ feet with supervision and cues for turning and lifting RLE to prevent toe drag. Pt still requiring multiple cues during turns and routinely turns opposite direction of therapist instruction. Pt performs car transfer with supervision and cues for hand placement. Ambulates up and down ramp with supervision for safety, then performs x12 6" steps with cues for sequencing. Pt performs BERG balance assessment as detailed below, demonstrating most difficulty with picking up object off floor and reaching forward outside BOS. Pt participates in balance and cognitive training with Wii bowling game, with PT providing verbal cues on body mechanics and sequencing. Pt ambulates back to room with cues for turning and pivoting to challenge dynamic balance. Left seated at EOB with alarm intact and all  needs within reach.  PT Discharge Precautions/Restrictions Restrictions Weight Bearing Restrictions: No Vital Signs Therapy Vitals Temp: 98.4 F (36.9 C) Temp Source: Oral Pulse Rate: 80 Resp: 17 BP: 132/88 Patient Position (if appropriate): Sitting Oxygen Therapy SpO2: 100 % O2 Device: Room Air Pain Pain Assessment Pain Scale: 0-10 Vision/Perception  Praxis Praxis: Intact  Cognition Overall Cognitive Status: Impaired/Different from baseline Arousal/Alertness: Awake/alert Orientation Level: Oriented to person;Oriented to place;Oriented to time;Disoriented to situation Behaviors: Impulsive Safety/Judgment: Impaired Sensation Sensation Light Touch: Appears Intact Coordination Gross Motor Movements are Fluid and Coordinated: Yes Motor  Motor Motor: Hemiplegia Motor - Skilled Clinical Observations: RUE weakness  Mobility Bed Mobility Bed Mobility: Supine to Sit;Sit to Supine Supine to Sit: Independent Sit to Supine: Independent Transfers Transfers: Sit to Stand;Stand to Sit;Stand Pivot Transfers Sit to Stand: Independent Stand to Sit: Independent Stand Pivot Transfers: Independent Locomotion  Gait Ambulation: Yes Gait Assistance: Supervision/Verbal cueing Gait Distance (Feet): 300 Feet Assistive device: None Gait Assistance Details: Verbal cues for technique Gait Gait: Yes Gait Pattern: Within Functional Limits Stairs / Additional Locomotion Stairs: Yes Stairs Assistance: Supervision/Verbal cueing Stair Management Technique: Two rails Number of Stairs: 12 Ramp: Supervision/Verbal cueing Curb: Supervision/Verbal cueing Wheelchair Mobility Wheelchair Mobility: No  Trunk/Postural Assessment  Thoracic Assessment Thoracic Assessment: Within Functional Limits Lumbar Assessment Lumbar Assessment: Within Functional Limits Postural Control Postural Control: Within Functional Limits  Balance Balance Balance Assessed: Yes Standardized Balance  Assessment Standardized Balance Assessment: Berg Balance Test Berg Balance Test Sit to Stand: Able to stand without using hands and stabilize independently Standing Unsupported: Able to stand safely 2 minutes Sitting with Back Unsupported but Feet Supported on Floor or Stool: Able to sit safely and securely 2 minutes Stand to Sit: Sits safely with minimal  use of hands Transfers: Able to transfer safely, minor use of hands Standing Unsupported with Eyes Closed: Able to stand 10 seconds with supervision Standing Ubsupported with Feet Together: Able to place feet together independently and stand 1 minute safely From Standing, Reach Forward with Outstretched Arm: Can reach forward >12 cm safely (5") From Standing Position, Pick up Object from Floor: Able to pick up shoe, needs supervision From Standing Position, Turn to Look Behind Over each Shoulder: Looks behind from both sides and weight shifts well Turn 360 Degrees: Able to turn 360 degrees safely in 4 seconds or less Standing Unsupported, Alternately Place Feet on Step/Stool: Able to stand independently and safely and complete 8 steps in 20 seconds Standing Unsupported, One Foot in Front: Able to place foot tandem independently and hold 30 seconds Standing on One Leg: Able to lift leg independently and hold 5-10 seconds Total Score: 52 Static Sitting Balance Static Sitting - Balance Support: No upper extremity supported Static Sitting - Level of Assistance: 7: Independent Dynamic Sitting Balance Dynamic Sitting - Balance Support: No upper extremity supported;During functional activity Dynamic Sitting - Level of Assistance: 7: Independent Static Standing Balance Static Standing - Balance Support: No upper extremity supported Static Standing - Level of Assistance: 7: Independent Dynamic Standing Balance Dynamic Standing - Balance Support: No upper extremity supported Dynamic Standing - Level of Assistance: 5: Stand by  assistance Extremity Assessment  RLE Assessment RLE Assessment: Within Functional Limits General Strength Comments: 5/5 LLE Assessment LLE Assessment: Within Functional Limits General Strength Comments: 5/5    Breck Coons, PT, DPT 06/20/2019, 3:22 PM

## 2019-06-20 NOTE — Progress Notes (Signed)
Speech Language Pathology Discharge Summary  Patient Details  Name: William Ellison MRN: [id number] Date of Birth: 1960-08-18  Today's Date: 06/20/2019 SLP Individual Time: 56-1400 SLP Individual Time Calculation (min): 58 min   Skilled Therapeutic Interventions:  Pt was seen for skilled ST targeting cognition and education with pt's significant other via phone - in-person education was scheduled and attempted but pt's significant other/caregiver was not present (SLP made social worker aware). During phone conversation with caregiver, SLP reviewed ST recommendations for cognition and language strategies, 24/7 supervision, and assistance with mildly complex ADLs. Informed her that handouts and handwritten notes with more details were left for her review inside pt's belongings, which would go home with them tomorrow at d/c time. SLP further facilitated session with tasks targeting pt's cognitive-linguistic skills. Pt required Mod A verbal and visual cues for problem solving and Min A visual cues for use of a visual aid for recall within a semi-complex novel card task (blink). Pt also identified 2-3 differences between mildly distracting pictures with overall Min A verbal cues for basic problem solving and Supervision A verbal cues for word finding difficulty during task. Pt was oriented to self, place, and time, but slightly disoriented to situation. Pt left laying in bed with alarm set and needs within reach. Continue per current plan of care.       Patient has met 3 of 3 long term goals.  Patient to discharge at overall Min;Supervision level.  Reasons goals not met: n/a   Clinical Impression/Discharge Summary:   Pt made functional gains and met 3 out of 3 long term goals this admission. Pt currently requires Min assist for functionla communication due to aphasia impacting his word finding at the sentence level and comprehension of more complex/abstract language. He also required assistance with  mildly complex cognitive tasks due to impairments impacting his problem solving, short term memory, and higher levels of attention over extended periods of time. Pt also presents with fluctuating degrees of lethargy, which can further impact language and cognition and in turn his safety and daily functioning. Pt will required 24/7 supervision for greatest safety at home. Pt has demonstrated improved verbal output, expression of basic wants and needs, and use of word finding strategies, as well as sustained and selective attention. However, given aphasia and cognitive deficits still present, recommend pt continue to receive skilled OP ST services upon discharge. Pt education is complete; education with pt's significant other/primary caregiver was scheduled and attempted X2, however she was not present. Phone contact made with caregiver and written notes/handouts left with more details regarding ST recommendations left with pt's belongings.     Care Partner:  Caregiver Able to Provide Assistance: Yes  Type of Caregiver Assistance: Cognitive  Recommendation:  24 hour supervision/assistance;Outpatient SLP  Rationale for SLP Follow Up: Maximize functional communication;Maximize cognitive function and independence;Reduce caregiver burden   Equipment: none   Reasons for discharge: Discharged from hospital   Patient/Family Agrees with Progress Made and Goals Achieved: Yes    Arbutus Leas 06/20/2019, 7:25 AM

## 2019-06-21 MED ORDER — ACETAMINOPHEN 325 MG PO TABS: 325.0000 mg | ORAL_TABLET | ORAL | Status: DC | PRN

## 2019-06-21 MED ORDER — AMANTADINE HCL 100 MG PO CAPS: 100.0000 mg | ORAL_CAPSULE | Freq: Every day | ORAL | 0 refills | Status: DC

## 2019-06-21 MED ORDER — AMLODIPINE BESYLATE 5 MG PO TABS: 5.0000 mg | ORAL_TABLET | Freq: Every day | ORAL | 0 refills | Status: DC

## 2019-06-21 MED ORDER — ASPIRIN 325 MG PO TBEC: 325.0000 mg | DELAYED_RELEASE_TABLET | Freq: Every day | ORAL | 0 refills | Status: DC

## 2019-06-21 MED ORDER — ATORVASTATIN CALCIUM 80 MG PO TABS: 80.0000 mg | ORAL_TABLET | Freq: Every day | ORAL | 0 refills | Status: DC

## 2019-06-21 MED ORDER — CLOPIDOGREL BISULFATE 75 MG PO TABS: 75.0000 mg | ORAL_TABLET | Freq: Every day | ORAL | 0 refills | Status: DC

## 2019-06-21 NOTE — Progress Notes (Signed)
[place] PHYSICAL MEDICINE & REHABILITATION PROGRESS NOTE   Subjective/Complaints:   Pt reports no issues- ready to d/c home today- will have f/u in Rehab clinic- explained that to pt.    ROS:   Pt denies SOB, abd pain, CP, N/V/C/D, and vision changes   Objective:   No results found. No results for input(s): WBC, HGB, HCT, PLT in the last 72 hours. No results for input(s): NA, K, CL, CO2, GLUCOSE, BUN, CREATININE, CALCIUM in the last 72 hours.  Intake/Output Summary (Last 24 hours) at 06/21/2019 0838 Last data filed at 06/20/2019 1830 Gross per 24 hour  Intake 360 ml  Output --  Net 360 ml     Physical Exam: Vital Signs Blood pressure 132/88, pulse 72, temperature 98.1 F (36.7 C), temperature source Oral, resp. rate 17, height 5\' 10"  (1.778 m), weight 134.4 kg, SpO2 99 %.  Constitutional: No distress . Sitting up watching TV, appropriate, NAD HENT: Normocephalic.  Atraumatic. Eyes: EOMI. No discharge. Cardiovascular: RRR Respiratory: CTA B/L- no W/R/R- good air movement GI: Soft, NT, ND, (+)BS  Skin: Warm and dry.  Intact. Psych: impulsive; sleepy. Musc: No edema in extremities.  No tenderness in extremities. Neurological: less sleepy Expressive >>receptive aphasia, slightly better Motor: Appears to be 4+/5 throughout Right inattention- still notable on exam  Assessment/Plan: 1. 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  Physiatrist is providing close team supervision and 24 hour management of active medical problems listed below.  Physiatrist and rehab team continue to assess barriers to discharge/monitor patient progress toward functional and medical goals  Care Tool:  Bathing    Body parts bathed by patient: Buttocks, Front perineal area, Right upper leg, Left upper leg, Right arm, Left arm, Chest, Abdomen, Right lower  leg, Left lower leg, Face   Body parts bathed by helper: Front perineal area, Buttocks, Right lower leg, Left lower leg, Right arm, Left arm     Bathing assist Assist Level: Supervision/Verbal cueing     Upper Body Dressing/Undressing Upper body dressing   What is the patient wearing?: Pull over shirt    Upper body assist Assist Level: Set up assist    Lower Body Dressing/Undressing Lower body dressing      What is the patient wearing?: Underwear/pull up, Pants     Lower body assist Assist for lower body dressing: Supervision/Verbal cueing     Toileting Toileting Toileting Activity did not occur (Clothing management and hygiene only): Refused  Toileting assist Assist for toileting: Supervision/Verbal cueing     Transfers Chair/bed transfer  Transfers assist     Chair/bed transfer assist level: Independent     Locomotion Ambulation   Ambulation assist      Assist level: Supervision/Verbal cueing Assistive device: No Device Max distance: 300'   Walk 10 feet activity   Assist     Assist level: Supervision/Verbal cueing Assistive device: No Device   Walk 50 feet activity   Assist    Assist level: Supervision/Verbal cueing Assistive device: No Device    Walk 150 feet activity   Assist    Assist level: Supervision/Verbal cueing Assistive device: No Device    Walk 10 feet on uneven surface  activity   Assist     Assist level: Supervision/Verbal cueing     Wheelchair     Assist Will patient use wheelchair at discharge?: No  Wheelchair 50 feet with 2 turns activity    Assist            Wheelchair 150 feet activity     Assist          Blood pressure 132/88, pulse 72, temperature 98.1 F (36.7 C), temperature source Oral, resp. rate 17, height 5\' 10"  (1.778 m), weight 134.4 kg, SpO2 99 %.  Medical Problem List and Plan: 1.  Right hemiparesis, neglect, global aphasia secondary to left brain  infarcts, posterior >anterior.  Continue CIR 2.  Antithrombotics: -DVT/anticoagulation:  Pharmaceutical: Lovenox             -antiplatelet therapy: DAPT--ASA 325 mg/Plavix 75 mg.  3. Pain Management:   Tylenol prn  6/11- denies pain 4. Mood: LCSW to follow for evaluation and support.              -antipsychotic agents: N/A 5. Neuropsych: This patient is not fully capable of making decisions on his own behalf. 6. Skin/Wound Care: Routine pressure relief  Measures.  7. Fluids/Electrolytes/Nutrition:  Monitor I/O.   8. HTN: Monitor BP   Continue Amlodipine daily. Continue to hold Lotensin/HCTZ--avoid hypotension.   Labile on 6/6, monitor for trend  6/8- BP 137/86- con't regimen  6/10- BP 132/90- don't want BP to drop, and systolic is well controlled- won't add additional meds at this point.   6/11- BP 130s/88- con't regimen             Monitor with increased mobility 9. Dyslipidemia: On high dose statin.  10. Pre-diabetes: Hgb A1c- 6.2. Will have RD educate on CM diet.  Controlled on 6/3  6/10- BG 129 on last BMP- doesn't want intervention             Monitor with increased mobility 11. Morbid obesity: BMI 42.3.  Educate/encourage weight loss.  6/3- as per #10 12. Sleep disturbance  Trazodone d/ced  6/9- worked 2nd shift- so 3pm to midnight, so usually sleeps late- asked PT/OT to schedule therapy later.   Improved 13. Aphasia  6/7- will try adding Amantadine 100 mg daily for speech recovery.   6/8- haven't seen any change in sedation/speech changes as of yet.   6/9- won't fix sedation due to 2nd shift work- will monitor speech 14. Dispo   6/11- d/c today with f/u in rehab clinic   LOS: 9 days A FACE TO FACE EVALUATION WAS PERFORMED  Ronelle Smallman 06/21/2019, 8:38 AM

## 2019-06-21 NOTE — Progress Notes (Signed)
Inpatient Rehabilitation Care Coordinator  Discharge Note  The overall goal for the admission was met for:   Discharge location: Yes. D/c to home.     Length of Stay: Yes. 9 days.  Discharge activity level: Yes. 24/7 supervision (due to cognition).   Home/community participation: Yes. Limited.  Services provided included: MD, RD, PT, OT, SLP, RN, CM, TR, Pharmacy, Neuropsych and SW  Financial Services: Private Insurance: [employer]  Follow-up services arranged: Outpatient: [facility] for PT/OT/ST and DME: family to purchase shower chair  Comments (or additional information): contact pt s/o [place] [phone])  Patient/Family verbalized understanding of follow-up arrangements: Yes  Individual responsible for coordination of the follow-up plan: Pt to have assistance with coordinating care needs.   Confirmed correct DME delivered: Rana Snare 06/21/2019    Rana Snare

## 2019-06-21 NOTE — Plan of Care (Signed)
  Problem: Consults Goal: RH STROKE PATIENT EDUCATION Description: See Patient Education module for education specifics  Outcome: Completed/Met   Problem: RH BOWEL ELIMINATION Goal: RH STG MANAGE BOWEL WITH ASSISTANCE Description: STG Manage Bowel with min Assistance. Outcome: Completed/Met   Problem: RH BLADDER ELIMINATION Goal: RH STG MANAGE BLADDER WITH ASSISTANCE Description: STG Manage Bladder With min Assistance Outcome: Completed/Met   Problem: RH SKIN INTEGRITY Goal: RH STG SKIN FREE OF INFECTION/BREAKDOWN Description: Skin to be free of infection and breakdown with min assist at discharge from CIR. Outcome: Completed/Met Goal: RH STG MAINTAIN SKIN INTEGRITY WITH ASSISTANCE Description: STG Maintain Skin Integrity With min Assistance. Outcome: Completed/Met Goal: RH STG ABLE TO PERFORM INCISION/WOUND CARE W/ASSISTANCE Description: STG Able To Perform Incision/Wound Care With min Assistance. Outcome: Completed/Met   Problem: RH SAFETY Goal: RH STG ADHERE TO SAFETY PRECAUTIONS W/ASSISTANCE/DEVICE Description: STG Adhere to Safety Precautions With min Assistance appropriate assistive Device as recommended by the team. Outcome: Completed/Met   Problem: RH PAIN MANAGEMENT Goal: RH STG PAIN MANAGED AT OR BELOW PT'S PAIN GOAL Description: <3 on a 0-10 pain scale. Outcome: Completed/Met   Problem: RH KNOWLEDGE DEFICIT Goal: RH STG INCREASE KNOWLEDGE OF HYPERTENSION Description: Patient will demonstrate knowledge of HTN medications, BP parameters as set by the MD, and follow up appointments with his doctors with min assist from [facility] staff. Outcome: Completed/Met Goal: RH STG INCREASE KNOWLEGDE OF HYPERLIPIDEMIA Description: Patient will demonstrate knowledge of HLD medications, dietary restrictions, and follow up appointments with his doctors with min assist from the CIR staff. Outcome: Completed/Met Goal: RH STG INCREASE KNOWLEDGE OF STROKE PROPHYLAXIS Description: Patient  will demonstrate knowledge of medications used and taken to help in the prevention of future strokes, dietary interventions, exercise programs, and follow up care with his doctors with min assist from CIR staff at discharge. Outcome: Completed/Met

## 2019-06-21 NOTE — Discharge Instructions (Signed)
Inpatient Rehab Discharge Instructions  Burleigh Brockmann Discharge date and time: 06/21/19    Activities/Precautions/ Functional Status: Activity: no lifting, driving, or strenuous exercise for till cleared by MD Diet: cardiac diet and diabetic diet Wound Care: none needed   Functional status:  ___ No restrictions     ___ Walk up steps independently _X__ 24/7 supervision/assistance   ___ Walk up steps with assistance ___ Intermittent supervision/assistance  ___ Bathe/dress independently ___ Walk with walker     ___ Bathe/dress with assistance ___ Walk Independently    ___ Shower independently ___ Walk with assistance    ___ Shower with assistance _X__ No alcohol     ___ Return to work/school ________  Special Instructions:  STROKE/TIA DISCHARGE INSTRUCTIONS SMOKING Cigarette smoking nearly doubles your risk of having a stroke & is the single most alterable risk factor  If you smoke or have smoked in the last 12 months, you are advised to quit smoking for your health.  Most of the excess cardiovascular risk related to smoking disappears within a year of stopping.  Ask you doctor about anti-smoking medications  [place] Quit Line: 1-800-QUIT NOW  Free Smoking Cessation Classes [phone]  CHOLESTEROL Know your levels; limit fat & cholesterol in your diet  Lipid Panel     Component Value Date/Time   CHOL 198 06/07/2019 0653   TRIG 67 06/07/2019 0653   HDL 37 (L) 06/07/2019 0653   CHOLHDL 5.4 06/07/2019 0653   VLDL 13 06/07/2019 0653   LDLCALC 148 (H) 06/07/2019 0653      Many patients benefit from treatment even if their cholesterol is at goal.  Goal: Total Cholesterol (CHOL) less than 160  Goal:  Triglycerides (TRIG) less than 150  Goal:  HDL greater than 40  Goal:  LDL (LDLCALC) less than 100   BLOOD PRESSURE American Stroke Association blood pressure target is less that 120/80 mm/Hg  Your discharge blood pressure is:  BP: 132/88  Monitor your blood  pressure  Limit your salt and alcohol intake  Many individuals will require more than one medication for high blood pressure  DIABETES (A1c is a blood sugar average for last 3 months) Goal HGBA1c is under 7% (HBGA1c is blood sugar average for last 3 months)  Diabetes:     Lab Results  Component Value Date   HGBA1C 6.2 (H) 06/07/2019     Your HGBA1c can be lowered with medications, healthy diet, and exercise.  Check your blood sugar as directed by your physician  Call your physician if you experience unexplained or low blood sugars.  PHYSICAL ACTIVITY/REHABILITATION Goal is 30 minutes at least 4 days per week  Activity: No driving, Therapies:  Return to work: N/A  Activity decreases your risk of heart attack and stroke and makes your heart stronger.  It helps control your weight and blood pressure; helps you relax and can improve your mood.  Participate in a regular exercise program.  Talk with your doctor about the best form of exercise for you (dancing, walking, swimming, cycling).  DIET/WEIGHT Goal is to maintain a healthy weight  Your discharge diet is:  Diet Order            Diet Heart Healthy/Carb Modified  Yes; Fluid consistency: Thin  Diet effective now                liquids Your height is:  Height: 5\' 10"  (177.8 cm) Your current weight is: Weight: 134.4 kg Your Body Mass Index (BMI) is:  BMI (Calculated): 42.51  Following the type of diet specifically designed for you will help prevent another stroke.  Your goal weight is:  175 lbs  Your goal Body Mass Index (BMI) is 19-24.  Healthy food habits can help reduce 3 risk factors for stroke:  High cholesterol, hypertension, and excess weight.  RESOURCES Stroke/Support Group:  Call [phone]   STROKE EDUCATION PROVIDED/REVIEWED AND GIVEN TO PATIENT Stroke warning signs and symptoms How to activate emergency medical system (call 911). Medications prescribed at discharge. Need for follow-up after  discharge. Personal risk factors for stroke. Pneumonia vaccine given:  Flu vaccine given:  My questions have been answered, the writing is legible, and I understand these instructions.  I will adhere to these goals & educational materials that have been provided to me after my discharge from the hospital.      My questions have been answered and I understand these instructions. I will adhere to these goals and the provided educational materials after my discharge from the hospital.  Patient/Caregiver Signature _______________________________ Date __________  Clinician Signature _______________________________________ Date __________  Please bring this form and your medication list with you to all your follow-up doctor's appointments. Carbohydrate Counting For People With Diabetes  Foods with carbohydrates make your blood glucose level go up. Learning how to count carbohydrates can help you control your blood glucose levels. First, identify the foods you eat that contain carbohydrates. Then, using the Foods with Carbohydrates chart, determine about how much carbohydrates are in your meals and snacks. Make sure you are eating foods with fiber, protein, and healthy fat along with your carbohydrate foods.  Foods with Carbohydrates The following table shows carbohydrate foods that have about 15 grams of carbohydrate each. Using measuring cups, spoons, or a food scale when you first begin learning about carbohydrate counting can help you learn about the portion sizes you typically eat.  The following foods have 15 grams carbohydrate each:  Grains . 1 slice bread (1 ounce)  . 1 small tortilla (6-inch size)  .  large bagel (1 ounce)  . 1/3 cup pasta or rice (cooked)  .  hamburger or hot dog bun ( ounce)  .  cup cooked cereal  .  to  cup ready-to-eat cereal  . 2 taco shells (5-inch size) Fruit . 1 small fresh fruit ( to 1 cup)  .  medium banana  . 17 small grapes (3 ounces)  . 1 cup  melon or berries  .  cup canned or frozen fruit  . 2 tablespoons dried fruit (blueberries, cherries, cranberries, raisins)  .  cup unsweetened fruit juice  Starchy Vegetables .  cup cooked beans, peas, corn, potatoes/sweet potatoes  .  large baked potato (3 ounces)  . 1 cup acorn or butternut squash  Snack Foods . 3 to 6 crackers  . 8 potato chips or 13 tortilla chips ( ounce to 1 ounce)  . 3 cups popped popcorn  Dairy . 3/4 cup (6 ounces) nonfat plain yogurt, or yogurt with sugar-free sweetener  . 1 cup milk  . 1 cup plain rice, soy, coconut or flavored almond milk Sweets and Desserts .  cup ice cream or frozen yogurt  . 1 tablespoon jam, jelly, pancake syrup, table sugar, or honey  . 2 tablespoons light pancake syrup  . 1 inch square of frosted cake or 2 inch square of unfrosted cake  . 2 small cookies (2/3 ounce each) or  large cookie   Sometimes you'll have to estimate carbohydrate  amounts if you don't know the exact recipe. One cup of mixed foods like soups can have 1 to 2 carbohydrate servings, while some casseroles might have 2 or more servings of carbohydrate. Foods that have less than 20 calories in each serving can be counted as "free" foods. Count 1 cup raw vegetables, or  cup cooked non-starchy vegetables as "free" foods. If you eat 3 or more servings at one meal, then count them as 1 carbohydrate serving.   Foods without Carbohydrates  Not all foods contain carbohydrates. Meat, some dairy, fats, non-starchy vegetables, and many beverages don't contain carbohydrate. So when you count carbohydrates, you can generally exclude chicken, pork, beef, fish, seafood, eggs, tofu, cheese, butter, sour cream, avocado, nuts, seeds, olives, mayonnaise, water, black coffee, unsweetened tea, and zero-calorie drinks. Vegetables with no or low carbohydrate include green beans, cauliflower, tomatoes, and onions.  How much carbohydrate should I eat at each meal?  Carbohydrate counting  can help you plan your meals and manage your weight. Following are some starting points for carbohydrate intake at each meal. Work with your registered dietitian nutritionist to find the best range that works for your blood glucose and weight.    To Lose Weight To Maintain Weight  Women 2 - 3 carb servings 3 - 4 carb servings  Men 3 - 4 carb servings 4 - 5 carb servings  Checking your blood glucose after meals will help you know if you need to adjust the timing, type, or number of carbohydrate servings in your meal plan. Achieve and keep a healthy body weight by balancing your food intake and physical activity.  Tips  How should I plan my meals?  Plan for half the food on your plate to include non-starchy vegetables, like salad greens, broccoli, or carrots. Try to eat 3 to 5 servings of non-starchy vegetables every day. Have a protein food at each meal. Protein foods include chicken, fish, meat, eggs, or beans (note that beans contain carbohydrate). These two food groups (non-starchy vegetables and proteins) are low in carbohydrate. If you fill up your plate with these foods, you will eat less carbohydrate but still fill up your stomach. Try to limit your carbohydrate portion to  of the plate.   What fats are healthiest to eat?  Diabetes increases risk for heart disease. To help protect your heart, eat more healthy fats, such as olive oil, nuts, and avocado. Eat less saturated fats like butter, cream, and high-fat meats, like bacon and sausage. Avoid trans fats, which are in all foods that list "partially hydrogenated oil" as an ingredient.  What should I drink?  Choose drinks that are not sweetened with sugar. The healthiest choices are water, carbonated or seltzer waters, and tea and coffee without added sugars.  Sweet drinks will make your blood glucose go up very quickly. One serving of soda or energy drink is  cup. It is best to drink these beverages only if your blood glucose is low.   Artificially sweetened, or diet drinks, typically do not increase your blood glucose if they have zero calories in them. Read labels of beverages, as some diet drinks do have carbohydrate and will raise your blood glucose.  Label Reading Tips Read Nutrition Facts labels to find out how many grams of carbohydrate are in a food you want to eat. Don't forget: sometimes serving sizes on the label aren't the same as how much food you are going to eat, so you may need to calculate how much  carbohydrate is in the food you are serving yourself.   Carbohydrate Counting for People with Diabetes Sample 1-Day Menu  Breakfast  cup yogurt, low fat, low sugar (1 carbohydrate serving)   cup cereal, ready-to-eat, unsweetened (1 carbohydrate serving)  1 cup strawberries (1 carbohydrate serving)   cup almonds ( carbohydrate serving)  Lunch 1, 5 ounce can chunk light tuna  2 ounces cheese, low fat cheddar  6 whole wheat crackers (1 carbohydrate serving)  1 small apple (1 carbohydrate servings)   cup carrots ( carbohydrate serving)   cup snap peas  1 cup 1% milk (1 carbohydrate serving)   Evening Meal Stir fry made with: 3 ounces chicken  1 cup brown rice (3 carbohydrate servings)   cup broccoli ( carbohydrate serving)   cup green beans   cup onions  1 tablespoon olive oil  2 tablespoons teriyaki sauce ( carbohydrate serving)  Evening Snack 1 extra small banana (1 carbohydrate serving)  1 tablespoon peanut butter   Carbohydrate Counting for People with Diabetes Vegan Sample 1-Day Menu  Breakfast 1 cup cooked oatmeal (2 carbohydrate servings)   cup blueberries (1 carbohydrate serving)  2 tablespoons flaxseeds  1 cup soymilk fortified with calcium and vitamin D  1 cup coffee  Lunch 2 slices whole wheat bread (2 carbohydrate servings)   cup baked tofu   cup lettuce  2 slices tomato  2 slices avocado   cup baby carrots ( carbohydrate serving)  1 orange (1 carbohydrate serving)  1  cup soymilk fortified with calcium and vitamin D   Evening Meal Burrito made with: 1 6-inch corn tortilla (1 carbohydrate serving)  1 cup refried vegetarian beans (2 carbohydrate servings)   cup chopped tomatoes   cup lettuce   cup salsa  1/3 cup brown rice (1 carbohydrate serving)  1 tablespoon olive oil for rice   cup zucchini   Evening Snack 6 small whole grain crackers (1 carbohydrate serving)  2 apricots ( carbohydrate serving)   cup unsalted peanuts ( carbohydrate serving)    Carbohydrate Counting for People with Diabetes Vegetarian (Lacto-Ovo) Sample 1-Day Menu  Breakfast 1 cup cooked oatmeal (2 carbohydrate servings)   cup blueberries (1 carbohydrate serving)  2 tablespoons flaxseeds  1 egg  1 cup 1% milk (1 carbohydrate serving)  1 cup coffee  Lunch 2 slices whole wheat bread (2 carbohydrate servings)  2 ounces low-fat cheese   cup lettuce  2 slices tomato  2 slices avocado   cup baby carrots ( carbohydrate serving)  1 orange (1 carbohydrate serving)  1 cup unsweetened tea  Evening Meal Burrito made with: 1 6-inch corn tortilla (1 carbohydrate serving)   cup refried vegetarian beans (1 carbohydrate serving)   cup tomatoes   cup lettuce   cup salsa  1/3 cup brown rice (1 carbohydrate serving)  1 tablespoon olive oil for rice   cup zucchini  1 cup 1% milk (1 carbohydrate serving)  Evening Snack 6 small whole grain crackers (1 carbohydrate serving)  2 apricots ( carbohydrate serving)   cup unsalted peanuts ( carbohydrate serving)    Copyright 2020  [employer]y [employer]. All rights reserved.  Using Nutrition Labels: Carbohydrate  . Serving Size  . Look at the serving size. All the information on the label is based on this portion. Jolyne Loa Per Container  . The number of servings contained in the package. . Guidelines for Carbohydrate  . Look at the total grams of carbohydrate in the serving  size.  . 1 carbohydrate choice = 15  grams of carbohydrate.  Range of Carbohydrate Grams Per Choice  Carbohydrate Grams/Choice Carbohydrate Choices  6-10   11-20 1  21-25 1  26-35 2  36-40 2  41-50 3  51-55 3  56-65 4  66-70 4  71-80 5    Copyright 2020  [employer]y [employer]. All rights reserved.

## 2019-06-21 NOTE — Progress Notes (Signed)
Patient discharge from hospital during shift. Discharge instructions provided via [facility]. No questions at time. Kalman Shan, LPN
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
## 2019-06-25 ENCOUNTER — Ambulatory Visit: Payer: BC Managed Care – PPO

## 2019-07-03 ENCOUNTER — Encounter: Payer: BC Managed Care – PPO | Attending: Registered Nurse | Admitting: Registered Nurse

## 2019-07-16 ENCOUNTER — Ambulatory Visit (INDEPENDENT_AMBULATORY_CARE_PROVIDER_SITE_OTHER): Payer: BC Managed Care – PPO | Admitting: *Deleted

## 2019-07-16 DIAGNOSIS — I639 Cerebral infarction, unspecified: Secondary | ICD-10-CM

## 2019-07-16 LAB — CUP PACEART REMOTE DEVICE CHECK
Date Time Interrogation Session: 20210706021228
Implantable Pulse Generator Implant Date: 20210602

## 2019-07-17 NOTE — Progress Notes (Signed)
Carelink Summary Report / Loop Recorder 

## 2019-07-19 ENCOUNTER — Other Ambulatory Visit: Payer: Self-pay | Admitting: Physical Medicine and Rehabilitation

## 2019-07-31 ENCOUNTER — Encounter: Payer: Self-pay | Admitting: Adult Health

## 2019-07-31 ENCOUNTER — Ambulatory Visit (INDEPENDENT_AMBULATORY_CARE_PROVIDER_SITE_OTHER): Payer: BC Managed Care – PPO | Admitting: Adult Health

## 2019-07-31 ENCOUNTER — Other Ambulatory Visit: Payer: Self-pay

## 2019-07-31 VITALS — BP 152/106 | HR 92 | Ht 70.0 in | Wt 274.8 lb

## 2019-07-31 DIAGNOSIS — R29818 Other symptoms and signs involving the nervous system: Secondary | ICD-10-CM | POA: Diagnosis not present

## 2019-07-31 DIAGNOSIS — E785 Hyperlipidemia, unspecified: Secondary | ICD-10-CM

## 2019-07-31 DIAGNOSIS — R7303 Prediabetes: Secondary | ICD-10-CM

## 2019-07-31 DIAGNOSIS — I639 Cerebral infarction, unspecified: Secondary | ICD-10-CM | POA: Diagnosis not present

## 2019-07-31 DIAGNOSIS — I10 Essential (primary) hypertension: Secondary | ICD-10-CM

## 2019-07-31 MED ORDER — AMANTADINE HCL 100 MG PO CAPS: 100.0000 mg | ORAL_CAPSULE | Freq: Every day | ORAL | 2 refills | Status: DC

## 2019-07-31 MED ORDER — ATORVASTATIN CALCIUM 80 MG PO TABS: 80.0000 mg | ORAL_TABLET | Freq: Every day | ORAL | 3 refills | Status: DC

## 2019-07-31 MED ORDER — AMLODIPINE BESYLATE 5 MG PO TABS: 5.0000 mg | ORAL_TABLET | Freq: Every day | ORAL | 0 refills | Status: DC

## 2019-07-31 MED ORDER — CLOPIDOGREL BISULFATE 75 MG PO TABS: 75.0000 mg | ORAL_TABLET | Freq: Every day | ORAL | 0 refills | Status: DC

## 2019-07-31 NOTE — Patient Instructions (Signed)
Please schedule visits for physical, occupational and speech therapy at neuro rehab  Continue aspirin 325 mg daily and clopidogrel 75 mg daily  and atorvastatin for secondary stroke prevention  Continue Plavix for additional 1 month and then discontinue -30-day refill provided  Continue to follow up with PCP regarding cholesterol and blood pressure management -please reschedule follow-up visit with your PCP.  Short-term refill provided on your blood pressure medication for ongoing refills will be managed by your PCP  Please schedule follow-up visit  Lovorn, Aundra Millet, MD Follow up.   Specialty: Physical Medicine and Rehabilitation Contact information: [address] [place] [address] [phone]            Highly recommend monitoring blood pressure at home to ensure satisfactory management  Your loop recorder will continue to be monitored for possible atrial fibrillation  Maintain strict control of hypertension with blood pressure goal below 130/90, diabetes with hemoglobin A1c goal below 6.5% and cholesterol with LDL cholesterol (bad cholesterol) goal below 70 mg/dL. I also advised the patient to eat a healthy diet with plenty of whole grains, cereals, fruits and vegetables, exercise regularly and maintain ideal body weight.  Followup in the future with me in 3 months or call earlier if needed       Thank you for coming to see [place] at [facility]. I hope we have been able to provide you high quality care today.  You may receive a patient satisfaction survey over the next few weeks. We would appreciate your feedback and comments so that we may continue to improve ourselves and the health of our patients.

## 2019-07-31 NOTE — Progress Notes (Signed)
[facility] [address] [place]. [place] [id number] [phone]       HOSPITAL FOLLOW UP NOTE  Mr. Scarlette CalicoRoderick Krolikowski Date of Birth:  1960/03/26 Medical Record Number:  [id number]   Reason for Referral:  hospital stroke follow up    SUBJECTIVE:   CHIEF COMPLAINT:  Chief Complaint  Patient presents with  . Hospitalization Follow-up    Treatment Room,  alone.  Raliegh IpNatalie Stroud pcp, has not seen yet.  Ran out of meds yesterday.  Did not take Bp today.  Therapy's only 2 wks after D/c allowed per insurance per pt states.   . Cerebrovascular Accident    HPI:   Mr.Tryce Bryantis a 59 y.o.malewith morbid obesity who presented on 06/07/2019 who awakened with confusion then passed out and later developed word finding difficulties.  Stroke work-up revealed left MCA territory infarcts in setting of left M1 occlusion, embolic secondary to unknown source vs large vessel disease given LVO L M1 occlusion.  Loop recorder placed to evaluate for atrial fibrillation as possible etiology.  Recommended DAPT for 3 months then aspirin alone due to LVO.  Hypertensive urgency with BP 160/111 with noncompliance of previously prescribed medications and discharged on amlodipine 5 mg daily with long-term BP goal normotensive range.  LDL 148 initiate atorvastatin 80 mg daily.  Prediabetes with A1c 6.2.  Other stroke risk factors include EtOH use, morbid obesity, and likely OSA but no prior stroke history.  Residual deficits of expressive aphasia and mild receptive aphasia, right lower facial weakness and right hemiparesis.  Evaluated by therapies and recommended discharge to CIR for ongoing therapy needs.  Stroke: L MCA territory infarcts embolic secondary to unknown source vs large vessel disease given LVO L M1 occlusion   CT head multifocial L cerebral infarcts. ? L MCA hyperdensity. No hemorrhage. ASPECTS 6  CTA head &neck L M1 occlusion.  CT perfusion L MCA cord underestimated.  Penumbra < 39mL (probably 20-2625mL)  MRI Scattered L MCA territory infarcts. abnormal distal L M1 and proximal M2 c/w occlusion.  2D Echo- EF 60 - 65%. No cardiac source of emboli identified.   LE doppler - neg DVT  TEE neg for embolic source  implantable loop recorder to evaluate for atrial fibrillation as etiology of strokeplaced 6/2 [facility]   [id number] -initiate atorvastatin 80 mg daily  HgbA1c6.2  lovenoxfor VTE prophylaxis  No antithromboticprior to admission, now on aspirin 325 andplavix 75 dailyx 3 months then aspirin alone given LVO.  Therapy recommendations: CIR  Disposition: CIR   Today, 07/31/2019, Mr. Beverely PaceBryant is being seen for hospital follow-up unaccompanied.  He was discharged home from CIR on 06/21/2019 after uncomplicated 9-day stay.  Residual deficits of expressive aphasia and occasional receptive aphasia, cognitive impairment and right arm weakness.  Reports some improvement.  Placed on disability at discharge from CIR and is requesting renewal per insurance request.  Remains on amantadine initially prescribed by PMR during CIR admission.  He has not had any follow-up with PCP, PMR or outpatient therapy.  Reports recently running out of all medications yesterday but previously compliant with DAPT and atorvastatin without side effects.  Blood pressure today 152/106.  He does not routinely monitor at home but again, has not taken amlodipine dosage since yesterday.  Loop recorder has not shown atrial fibrillation thus far.  Reports some depression/anxiety without prior history.  Reports daytime fatigue, snoring, morning headaches and insomnia.  Not previously evaluated for sleep apnea.  No further concerns at this time.  ROS:   14 system review of systems performed and negative with exception of memory loss, confusion, speech difficulty, weakness, depression, fatigue  PMH:  Past Medical History:  Diagnosis Date  . Erectile dysfunction   . Hypertension     . Skin rash     PSH:  Past Surgical History:  Procedure Laterality Date  . BUBBLE STUDY  06/12/2019   Procedure: BUBBLE STUDY;  Surgeon: Chilton Si, MD;  Location: [facility] ENDOSCOPY;  Service: Cardiovascular;;  . LOOP RECORDER INSERTION N/A 06/12/2019   Procedure: LOOP RECORDER INSERTION;  Surgeon: Regan Lemming, MD;  Location: [facility] INVASIVE CV LAB;  Service: Cardiovascular;  Laterality: N/A;  . TEE WITHOUT CARDIOVERSION N/A 06/12/2019   Procedure: TRANSESOPHAGEAL ECHOCARDIOGRAM (TEE);  Surgeon: Chilton Si, MD;  Location: [facility] ENDOSCOPY;  Service: Cardiovascular;  Laterality: N/A;    Social History:  Social History   Socioeconomic History  . Marital status: Significant Other    Spouse name: Not on file  . Number of children: Not on file  . Years of education: Not on file  . Highest education level: Not on file  Occupational History  . Not on file  Tobacco Use  . Smoking status: Never Smoker  . Smokeless tobacco: Never Used  Vaping Use  . Vaping Use: Never used  Substance and Sexual Activity  . Alcohol use: Yes  . Drug use: Never  . Sexual activity: Yes  Other Topics Concern  . Not on file  Social History Narrative  . Not on file   Social Determinants of Health   Financial Resource Strain:   . Difficulty of Paying Living Expenses:   Food Insecurity:   . Worried About Programme researcher, broadcasting/film/video in the Last Year:   . Barista in the Last Year:   Transportation Needs:   . Freight forwarder (Medical):   Marland Kitchen Lack of Transportation (Non-Medical):   Physical Activity:   . Days of Exercise per Week:   . Minutes of Exercise per Session:   Stress:   . Feeling of Stress :   Social Connections:   . Frequency of Communication with Friends and Family:   . Frequency of Social Gatherings with Friends and Family:   . Attends Religious Services:   . Active Member of Clubs or Organizations:   . Attends Banker Meetings:   Marland Kitchen Marital Status:   Intimate  Partner Violence:   . Fear of Current or Ex-Partner:   . Emotionally Abused:   Marland Kitchen Physically Abused:   . Sexually Abused:     Family History:  Family History  Problem Relation Age of Onset  . Hypertension Mother     Medications:   Current Outpatient Medications on File Prior to Visit  Medication Sig Dispense Refill  . acetaminophen (TYLENOL) 325 MG tablet Take 1-2 tablets (325-650 mg total) by mouth every 4 (four) hours as needed for mild pain.    Marland Kitchen aspirin 325 MG EC tablet Take 1 tablet (325 mg total) by mouth daily. 100 tablet 0   No current facility-administered medications on file prior to visit.    Allergies:  No Known Allergies    OBJECTIVE:  Physical Exam  Vitals:   07/31/19 1258  BP: (!) 152/106  Pulse: 92  Weight: 274 lb 12.8 oz (124.6 kg)  Height: 5\' 10"  (1.778 m)   Body mass index is 39.43 kg/m. No exam data present  Depression screen [facility] 2/9 07/31/2019  Decreased Interest 1  Down, Depressed,  Hopeless 1  PHQ - 2 Score 2  Altered sleeping 1  Tired, decreased energy 2  Change in appetite 2  Feeling bad or failure about yourself  2  Trouble concentrating 1  Moving slowly or fidgety/restless 1  Suicidal thoughts 0  PHQ-9 Score 11     General: Morbidly obese middle-aged pleasant African-American male, seated, in no evident distress Head: head normocephalic and atraumatic.   Neck: supple with no carotid or supraclavicular bruits Cardiovascular: regular rate and rhythm, no murmurs Musculoskeletal: no deformity Skin:  no rash/petichiae Vascular:  Normal pulses all extremities   Neurologic Exam Mental Status: Awake and fully alert.   Expressive aphasia with speech hesitancy.  Delayed response to questions.  Unable to appreciate receptive aphasia.  Oriented to place and time. Recent and remote memory impaired. Attention span, concentration and fund of knowledge impaired. Mood and affect flat. Cranial Nerves: Fundoscopic exam reveals sharp disc margins.  Pupils equal, briskly reactive to light. Extraocular movements full without nystagmus. Visual fields full to confrontation. Hearing intact. Facial sensation intact.  Right lower facial weakness.  Tongue and palate moves normally and symmetrically.  Motor: Normal bulk and tone. Normal strength in all tested extremity muscles except mild RUE weakness with spasticity with decreased finger dexterity. Sensory.: intact to touch , pinprick , position and vibratory sensation.  Coordination: Rapid alternating movements normal in all extremities except slightly decreased right hand. Finger-to-nose performed accurately in left arm and heel-to-shin performed accurately bilaterally. Gait and Station: Arises from chair without difficulty. Stance is normal. Gait demonstrates normal stride length and balance without use of assistive device Reflexes: 1+ and symmetric. Toes downgoing.     NIHSS  2 Modified Rankin  3     ASSESSMENT: Peterson Mathey is a 59 y.o. year old male presented on 06/07/2019 with confusion upon awakening then passing out and later developed word finding difficulties.  Stroke work-up revealed left MCA territory infarcts in setting of left M1 occlusion, unknown etiology possibly embolic secondary to unknown source vs large vessel disease given LVO L M1 occlusion.  Loop recorder placed to evaluate for atrial fibrillation as potential etiology.  Vascular risk factors include HTN and medication noncompliance, HLD, pre-DM, morbid obesity and likely OSA.     PLAN:  1. Left MCA territory stroke:  -Residual deficits: Expressive aphasia, cognitive impairment, right facial droop and RUE weakness.  Improvement since discharge despite formal therapy.  Walked patient to neuro rehab to schedule PT/OT/SLP for hopeful further recovery. -Advised to schedule follow-up with PMR and discuss further regarding disability paperwork.  Amantadine refilled but advised ongoing prescribing and management will be  through PMR -Post stroke depression/anxiety: Not interested in medication or counseling at this time.  He is hopeful for improvement once therapy is initiated.  He will follow-up with PCP if he wishes to initiate depression management -Continue aspirin 325 mg daily and clopidogrel 75 mg daily  and atorvastatin 80 mg daily for secondary stroke prevention.  Continue clopidogrel for additional 4 weeks and then discontinue.  Refills provided but was advised ongoing refills will be through PCP -Loop recorder will continue to be monitored for possible atrial fibrillation.   -Ongoing follow-up with PCP for aggressive stroke risk factor management 2. HTN: BP goal<130/90.  High side today possibly due to running out of antihypertensive.  Amlodipine 5 mg daily refilled short-term and advise needed follow-up with PCP.  Also encouraged monitoring at home to ensure satisfactory management  3. HLD: LDL goal<70.  Recent LDL 148.  Continue atorvastatin and ensure follow-up with PCP for repeat lipid panel, prescribing of statin and ongoing monitoring and management 4. Pre-DMII: A1c goal<7.  Recent A1c 6.2.  Continue to follow PCP for monitoring 5. Suspected sleep apnea: Referral placed to [facility] sleep clinic    Follow up in 3 months or call earlier if needed   I spent 45 minutes of face-to-face and non-face-to-face time with patient.  This included previsit chart review, lab review, study review, order entry, electronic health record documentation, patient education regarding recent stroke, residual deficits, importance of managing stroke risk factors and answered all questions to patient satisfaction     Ihor Austin, [facility]  [facility] [address] [place], [place] [address]  Phone [phone] Fax [phone] Note: This document was prepared with digital dictation and possible smart phrase technology. Any transcriptional errors that result from this process are  unintentional.

## 2019-08-05 ENCOUNTER — Ambulatory Visit: Payer: BC Managed Care – PPO | Attending: Physical Medicine and Rehabilitation

## 2019-08-05 ENCOUNTER — Other Ambulatory Visit: Payer: Self-pay

## 2019-08-05 ENCOUNTER — Ambulatory Visit: Payer: BC Managed Care – PPO | Admitting: Occupational Therapy

## 2019-08-05 DIAGNOSIS — R2689 Other abnormalities of gait and mobility: Secondary | ICD-10-CM | POA: Diagnosis not present

## 2019-08-05 DIAGNOSIS — R41841 Cognitive communication deficit: Secondary | ICD-10-CM | POA: Diagnosis not present

## 2019-08-05 DIAGNOSIS — M6281 Muscle weakness (generalized): Secondary | ICD-10-CM | POA: Diagnosis not present

## 2019-08-05 DIAGNOSIS — R278 Other lack of coordination: Secondary | ICD-10-CM

## 2019-08-05 DIAGNOSIS — R4701 Aphasia: Secondary | ICD-10-CM | POA: Diagnosis not present

## 2019-08-05 DIAGNOSIS — I69318 Other symptoms and signs involving cognitive functions following cerebral infarction: Secondary | ICD-10-CM | POA: Diagnosis not present

## 2019-08-05 DIAGNOSIS — I63512 Cerebral infarction due to unspecified occlusion or stenosis of left middle cerebral artery: Secondary | ICD-10-CM | POA: Insufficient documentation

## 2019-08-05 NOTE — Therapy (Signed)
[facility] [address] [place], [place], [address] Phone: [phone]   Fax:  [phone]  Physical Therapy Evaluation  Patient Details  Name: William Ellison MRN: [id number] Date of Birth: [birth year] No data recorded  Encounter Date: 08/05/2019   PT End of Session - 08/05/19 1055    Visit Number 1    Number of Visits 1    PT Start Time 0845    PT Stop Time 0925    PT Time Calculation (min) 40 min    Equipment Utilized During Treatment Gait belt    Activity Tolerance Patient tolerated treatment well    Behavior During Therapy [facility] for tasks assessed/performed           Past Medical History:  Diagnosis Date  . Erectile dysfunction   . Hypertension   . Skin rash     Past Surgical History:  Procedure Laterality Date  . BUBBLE STUDY  06/12/2019   Procedure: BUBBLE STUDY;  Surgeon: Chilton Si, MD;  Location: [facility] ENDOSCOPY;  Service: Cardiovascular;;  . LOOP RECORDER INSERTION N/A 06/12/2019   Procedure: LOOP RECORDER INSERTION;  Surgeon: Regan Lemming, MD;  Location: [facility] INVASIVE CV LAB;  Service: Cardiovascular;  Laterality: N/A;  . TEE WITHOUT CARDIOVERSION N/A 06/12/2019   Procedure: TRANSESOPHAGEAL ECHOCARDIOGRAM (TEE);  Surgeon: Chilton Si, MD;  Location: [facility] ENDOSCOPY;  Service: Cardiovascular;  Laterality: N/A;    There were no vitals filed for this visit.    Subjective Assessment - 08/05/19 1041    Subjective Pt reports of feeling confused and having speech problems on 06/07/19 which progressively got worst on the day. They decided to call EMS and went to ED. Minimal involvement of the precentral gyrus.  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 Pt lives with his wife and 3 sons (18,17, and 90 yrs old). Patient was an Artist prior to stroke. He reports that he was on short term disability but he was dropped (pt is  unsure why). Patient came home from inpatient stay on 06/21/19. Pt reports of no falls. Patient is able to perform all self care and ADLs independently. Patient is able to ambulate without AD. Pt has not started driving yet. Patient reports of cognitive issues and speech impairment which is slowly improving.    Pertinent History HTN, pre diabetes, morbid obesity    How long can you sit comfortably? no issues    How long can you stand comfortably? no issues    How long can you walk comfortably? no issues    Diagnostic tests IMPRESSION:1. Scatter foci of acute/subacute nonhemorrhagic infarct involvingthe left MCA territory, predominantly posteriorly.2. Minimal involvement of the precentral gyrus.3. Scattered subcortical infarcts more anteriorly.4. Abnormal signal in the distal left M1 segment and proximal M2segments consistent with known arterial occlusion. This is slightlymore distal than on the CTA. IMPRESSION:1. Left M1 occlusion with moderate collateralization in the left MCAterritory.2. Calculated core infarct in the left MCA territory isunderestimated, omitting multiple areas of abnormality demonstratedon the earlier noncontrast head CT. The penumbra volume is less than39 mL, probably on the order of 20-25 mL.3. ASPECTS of 6 on noncontrast head CT from earlier today.    Patient Stated Goals get back to PLOF    Currently in Pain? No/denies              [facility] PT Assessment - 08/05/19 0914      Assessment   Medical Diagnosis CVA  Onset Date/Surgical Date 06/07/19    Hand Dominance Right      Precautions   Precautions None      Restrictions   Weight Bearing Restrictions No      Balance Screen   Has the patient fallen in the past 6 months No    Has the patient had a decrease in activity level because of a fear of falling?  No    Is the patient reluctant to leave their home because of a fear of falling?  No      Home Nurse, mental health Private residence    Living  Arrangements Spouse/significant other;Children    Available Help at Discharge Family    Type of Home Apartment    Home Access Stairs to enter    Entrance Stairs-Number of Steps 1    Entrance Stairs-Rails None    Home Layout One level    Home Equipment None      Prior Function   Level of Independence Independent    Vocation Full time employment    Academic librarian      Cognition   Overall Cognitive Status Impaired/Different from baseline    Area of Impairment Memory;Problem solving    Memory Impaired      ROM / Strength   AROM / PROM / Strength Strength      Strength   Overall Strength Within functional limits for tasks performed      Transfers   Transfers Sit to Stand;Stand to Sit    Sit to Stand 7: Independent      Ambulation/Gait   Ambulation/Gait Yes    Ambulation Distance (Feet) 115 Feet    Assistive device None    Gait Pattern Within Functional Limits    Ambulation Surface Indoor;Level      Standardized Balance Assessment   Standardized Balance Assessment Five Times Sit to Stand    Five times sit to stand comments  14 sec      Functional Gait  Assessment   Gait assessed  Yes    Gait Level Surface Walks 20 ft in less than 5.5 sec, no assistive devices, good speed, no evidence for imbalance, normal gait pattern, deviates no more than 6 in outside of the 12 in walkway width.    Change in Gait Speed Able to smoothly change walking speed without loss of balance or gait deviation. Deviate no more than 6 in outside of the 12 in walkway width.    Gait with Horizontal Head Turns Performs head turns smoothly with no change in gait. Deviates no more than 6 in outside 12 in walkway width    Gait with Vertical Head Turns Performs head turns with no change in gait. Deviates no more than 6 in outside 12 in walkway width.    Gait and Pivot Turn Pivot turns safely within 3 sec and stops quickly with no loss of balance.    Step Over Obstacle Is able to step  over 2 stacked shoe boxes taped together (9 in total height) without changing gait speed. No evidence of imbalance.    Gait with Narrow Base of Support Is able to ambulate for 10 steps heel to toe with no staggering.    Gait with Eyes Closed Walks 20 ft, no assistive devices, good speed, no evidence of imbalance, normal gait pattern, deviates no more than 6 in outside 12 in walkway width. Ambulates 20 ft in less than 7 sec.    Ambulating Backwards Walks 20  ft, no assistive devices, good speed, no evidence for imbalance, normal gait    Steps Alternating feet, no rail.    Total Score 30                      Objective measurements completed on examination: See above findings.               PT Education - 08/05/19 1050    Education Details Pt educated on working on gradual walking program of 30 min/day and he can work it up to 60 min of walking 3-4x/week. pt educated to walk during the cooler time in the day (morning). Pt has joined [employer] and was educated that he can gradually work on building his strength but make sure to keep weights light to moderate and not to heavy to avoid excessive fatigue.    Person(s) Educated Patient    Methods Explanation    Comprehension Verbalized understanding               PT Long Term Goals - 08/05/19 1052      PT LONG TERM GOAL #1   Title No LTG established as patient will be discharged after evaluation.                  Plan - 08/05/19 1052    Clinical Impression Statement Patient is a 59 y.o. male who was seen today for physical therapy evaluation and treatment after recent CVA. Patient currently demonstrates [facility] of strength in bil LE. Patient is demonstrating [facility] of gait and is able to ambulate without any assistive device. Patient demonstrate 30/30 on functional gait index which demonstrates [employer] of dynamic balance with gait. At this point, patient doesn't need formal skilled physicla therapy. Patient was  educated on gradual walking program and working on strength building independently.    Personal Factors and Comorbidities Past/Current Experience;Time since onset of injury/illness/exacerbation    Examination-Participation Restrictions Driving    Stability/Clinical Decision Making Stable/Uncomplicated    Clinical Decision Making Low    Rehab Potential Excellent    PT Treatment/Interventions Patient/family education    PT Next Visit Plan Pt is discharged    Consulted and Agree with Plan of Care Patient           Patient will benefit from skilled therapeutic intervention in order to improve the following deficits and impairments:  Decreased cognition  Visit Diagnosis: Acute ischemic left MCA stroke (HCC)  Other abnormalities of gait and mobility     Problem List Patient Active Problem List   Diagnosis Date Noted  . Sleep disturbance   . Morbid obesity (HCC)   . Dyslipidemia   . Ischemic stroke (HCC) L MCA embolic vs large vessle w/ L M1 occlusion    . Benign essential HTN   . Prediabetes   . Hypokalemia   . 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 Hypertensive urgency 06/25/2018  . Hypertension 06/25/2018  . Blood pressure elevated without history of HTN 06/20/2018  . Class 3 severe obesity due to excess calories with serious comorbidity and body mass index (BMI) of 40.0 to 44.9 in adult (HCC) 06/20/2018  . Erectile dysfunction 06/20/2018  . Skin rash 06/20/2018    Ileana Ladd, PT 08/05/2019, 10:56 AM  [facility] [address] [place], [place], [address] Phone: [phone]   Fax:  [phone]  Name: Yaden Seith MRN: [id number] Date of Birth: 1960-03-13

## 2019-08-05 NOTE — Therapy (Signed)
[facility] [address] [place], [place], [address] Phone: [phone]   Fax:  [phone]  Occupational Therapy Evaluation  Patient Details  Name: William Ellison MRN: [id number] Date of Birth: 01/08/1961 Referring Provider (OT): Dr. Berline Chough   Encounter Date: 08/05/2019   OT End of Session - 08/05/19 1012    Visit Number 1    Number of Visits 13    Date for OT Re-Evaluation 09/25/19    Authorization Type BC/BS - 30 visit limit (combined OT/PT)    OT Start Time 0930    OT Stop Time 1010    OT Time Calculation (min) 40 min    Activity Tolerance Patient tolerated treatment well           Past Medical History:  Diagnosis Date  . Erectile dysfunction   . Hypertension   . Skin rash     Past Surgical History:  Procedure Laterality Date  . BUBBLE STUDY  06/12/2019   Procedure: BUBBLE STUDY;  Surgeon: Chilton Si, MD;  Location: [facility] ENDOSCOPY;  Service: Cardiovascular;;  . LOOP RECORDER INSERTION N/A 06/12/2019   Procedure: LOOP RECORDER INSERTION;  Surgeon: Regan Lemming, MD;  Location: [facility] INVASIVE CV LAB;  Service: Cardiovascular;  Laterality: N/A;  . TEE WITHOUT CARDIOVERSION N/A 06/12/2019   Procedure: TRANSESOPHAGEAL ECHOCARDIOGRAM (TEE);  Surgeon: Chilton Si, MD;  Location: [facility] ENDOSCOPY;  Service: Cardiovascular;  Laterality: N/A;    There were no vitals filed for this visit.   Subjective Assessment - 08/05/19 0931    Pertinent History Lt CVA 06/07/19. PMH: HTN, HLD, obesity    Limitations loop recorder    Currently in Pain? No/denies             [facility] OT Assessment - 08/05/19 0001      Assessment   Medical Diagnosis Lt MCA CVA    Referring Provider (OT) Dr. 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, use of screwdrivers/wrenches      ADL   Eating/Feeding Modified independent   80% with Rt hand   Grooming Modified independent   using both hands   Upper Body Bathing Independent    Lower Body Bathing Independent    Upper Body Dressing Independent    Lower Body Dressing Modified independent   slip on shoes (has not attempted tying shoes)    Lobbyist -  Database administrator Independent      IADL   Shopping Completely unable to shop   pt did some before stroke   Light Housekeeping Performs light daily tasks such as dishwashing, bed making   Pt puts clothes in washing machine and dryer   Meal Prep Able to complete simple cold meal and snack prep;Able to complete simple warm meal prep    Community Mobility Relies on family or friends  for transportation    Medication Management Has difficulty remembering to take medication      Mobility   Mobility Status Independent      Written Expression   Dominant Hand Right    Handwriting 90% legible   appears to have aphasia w/ writing     Vision - History   Baseline Vision Wears glasses only for reading    Additional Comments denies change      Cognition   Overall Cognitive Status Impaired/Different from baseline    Memory Impaired    Cognition Comments noted aphasia (word finding difficulties). Pt reports lacks confidence. Subtracts by 7's with max difficulty and 1 error      Sensation   Light Touch Appears Intact    Additional Comments pt reports numbness Rt hand when he first wakes up      Coordination   9 Hole Peg Test Right;Left    Right 9 Hole Peg Test 30.93 sec    Left 9 Hole Peg Test 22.19  sec      Edema   Edema mild Rt hand      ROM / Strength   AROM / PROM / Strength AROM;Strength      AROM   Overall AROM Comments BUE AROM WFL's but reports more difficulty Rt shoulder. Slightly decreased supination Rt side as well      Strength   Overall Strength Comments MMT RUE 4+/5, LUE 5/5      Hand Function   Right Hand Grip (lbs) 83.5 lbs    Left Hand Grip (lbs) 117.2 lbs                             OT Short Term Goals - 08/05/19 1212      OT SHORT TERM GOAL #1   Title Independent with HEP for RUE    Time 3    Period Weeks    Status New      OT SHORT TERM GOAL #2   Title Pt to improve grip strength by 10 lbs Rt hand    Baseline 83.5 lbs    Time 3    Period Weeks    Status New      OT SHORT TERM GOAL #3   Title Pt to improve coordination Rt hand as evidenced by reducing speed on 9 hole peg test to 25 sec or less    Baseline 30 sec    Time 3    Period Weeks    Status New      OT SHORT TERM GOAL #4   Title Pt to verbalize understanding with memory compensatory strategies    Time 3    Period Weeks    Status New      OT SHORT TERM GOAL #5   Title Pt to demo independence tying shoes and reports returning to using Rt hand 90% of time for eating/grooming    Time 3    Period Weeks    Status New             OT Long Term Goals - 08/05/19 1217      OT LONG TERM GOAL #1   Title Pt to return to cooking activities with distant supervision    Time 6    Period Weeks    Status New      OT LONG TERM GOAL #2   Title Pt to simulate work activities with use of tools and lifting/carrying  up to 25 lbs    Time 6    Period Weeks    Status New      OT LONG TERM GOAL #3   Title Pt to perform environmental scanning and simple physical task with 90% accuracy or greater    Time 6    Period Weeks    Status New      OT LONG TERM GOAL #4   Title Pt to be independent with theraband HEP    Time 6    Period Weeks    Status New                  Plan - 08/05/19 1014    Clinical Impression Statement Pt is a 59 y.o. male who presents to OPOT s/p Lt CVA on 06/07/19. Pt with mild weakness and coordination deficits Rt side, as well as cognitive deficits. Pt would benefit from O.T. to address these deficits and maximize function in order to be independent with all ADLS/IADLS and possibly return to work    OT Occupational Profile and History Detailed Assessment- Review of Records and additional review of physical, cognitive, psychosocial history related to current functional performance    Occupational performance deficits (Please refer to evaluation for details): ADL's;IADL's;Work    Body Structure / Function / Physical Skills ADL;IADL;Strength;Coordination;UE functional use    Cognitive Skills Memory;Problem Solve;Safety Awareness;Sequencing    Rehab Potential Good    Clinical Decision Making Several treatment options, min-mod task modification necessary    Comorbidities Affecting Occupational Performance: May have comorbidities impacting occupational performance    Modification or Assistance to Complete Evaluation  No modification of tasks or assist necessary to complete eval    OT Frequency 2x / week    OT Duration 6 weeks   PLUS EVAL   OT Treatment/Interventions Self-care/ADL training;Therapeutic exercise;Neuromuscular education;Manual Therapy;Therapeutic activities;Coping strategies training;Cognitive remediation/compensation;Visual/perceptual remediation/compensation;Patient/family education;Moist Heat;Cryotherapy;DME and/or AE instruction    Plan Coordination and putty HEP for Rt hand    Consulted and Agree with Plan of Care Patient           Patient will benefit from skilled therapeutic intervention in order to improve the following deficits and impairments:   Body Structure / Function / Physical Skills: ADL, IADL, Strength, Coordination, UE functional use Cognitive Skills: Memory, Problem Solve, Safety Awareness,  Sequencing     Visit Diagnosis: Muscle weakness (generalized)  Other lack of coordination  Other symptoms and signs involving cognitive functions following cerebral infarction    Problem List Patient Active Problem List   Diagnosis Date Noted  . Sleep disturbance   . Morbid obesity (HCC)   . Dyslipidemia   . Ischemic stroke (HCC) L MCA embolic vs large vessle w/ L M1 occlusion    . Benign essential HTN   . Prediabetes   . Hypokalemia   . 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 Hypertensive urgency 06/25/2018  . Hypertension 06/25/2018  . Blood pressure elevated without history of HTN 06/20/2018  . Class 3 severe obesity due to excess calories with serious comorbidity and body mass index (BMI) of 40.0 to 44.9 in adult (HCC) 06/20/2018  . Erectile dysfunction 06/20/2018  . Skin rash 06/20/2018    Kelli Churn, OTR/L 08/05/2019, 12:19 PM  [facility] [address] [place], [place], [address] Phone: [phone]   Fax:  [phone]  Name: Tyreon Frigon MRN: [id number] Date of Birth: 10-Oct-1960

## 2019-08-07 ENCOUNTER — Encounter: Payer: Self-pay | Admitting: Speech Pathology

## 2019-08-07 ENCOUNTER — Ambulatory Visit: Payer: BC Managed Care – PPO | Admitting: Speech Pathology

## 2019-08-07 ENCOUNTER — Other Ambulatory Visit: Payer: Self-pay

## 2019-08-07 DIAGNOSIS — R41841 Cognitive communication deficit: Secondary | ICD-10-CM | POA: Diagnosis not present

## 2019-08-07 DIAGNOSIS — R278 Other lack of coordination: Secondary | ICD-10-CM | POA: Diagnosis not present

## 2019-08-07 DIAGNOSIS — I69318 Other symptoms and signs involving cognitive functions following cerebral infarction: Secondary | ICD-10-CM | POA: Diagnosis not present

## 2019-08-07 DIAGNOSIS — R4701 Aphasia: Secondary | ICD-10-CM

## 2019-08-07 DIAGNOSIS — R2689 Other abnormalities of gait and mobility: Secondary | ICD-10-CM | POA: Diagnosis not present

## 2019-08-07 DIAGNOSIS — M6281 Muscle weakness (generalized): Secondary | ICD-10-CM | POA: Diagnosis not present

## 2019-08-07 DIAGNOSIS — I63512 Cerebral infarction due to unspecified occlusion or stenosis of left middle cerebral artery: Secondary | ICD-10-CM | POA: Diagnosis not present

## 2019-08-07 NOTE — Patient Instructions (Signed)
  Get a 3 ring binder, with sections for PT/OT/ST    Tips for Talking with People who have Aphasia  . Say one thing at a time . Don't  rush - slow down, be patient . Talk face to face . Reduce background noise . Relax - be natural . Use pen and paper . Write down key words . Draw diagrams or pictures . Don't pretend you understand . Ask what helps . Recap - check you both understand . Be a partner, not a therapist   Aphasia does not affect intelligence, only language. The person with aphasia can still: make decisions, have opinions, and socialize.   Describing words  What group does it belong to?  What do I use it for?  Where can I find it?  What does it LOOK like?  What other words go with it?  What is the 1st sound of the word?          Many Ways to Communicate  Describe it Write it Draw it Gesture it Use related words  There's an App for that: Family Feud, Heads up, Stop-fun categories, What if, Junie Bame  Provided by: Vanna Scotland, [id number]     Games for word finding: Scrabble Family Feud Heads up Outburst  Cognitive Activities you can do at home:   - Solitaire  - Majong  - Scrabble  - Chess/Checkers  - Crosswords (easy level)  - Education officer, community  - Card Games  - Board Games  - Connect 4  - Simon  - the Memory Game  - Dominoes  - Backgammon  - Dominoes  - Poker - teach your sons to play with you
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
## 2019-08-12 ENCOUNTER — Ambulatory Visit: Payer: BC Managed Care – PPO

## 2019-08-14 ENCOUNTER — Ambulatory Visit: Payer: Self-pay

## 2019-08-19 ENCOUNTER — Ambulatory Visit: Payer: Self-pay | Admitting: Speech Pathology

## 2019-08-19 ENCOUNTER — Other Ambulatory Visit: Payer: Self-pay

## 2019-08-19 ENCOUNTER — Ambulatory Visit (INDEPENDENT_AMBULATORY_CARE_PROVIDER_SITE_OTHER): Payer: BC Managed Care – PPO | Admitting: *Deleted

## 2019-08-19 ENCOUNTER — Encounter: Payer: Self-pay | Admitting: Occupational Therapy

## 2019-08-19 ENCOUNTER — Ambulatory Visit: Payer: Self-pay | Attending: Physical Medicine and Rehabilitation | Admitting: Occupational Therapy

## 2019-08-19 ENCOUNTER — Encounter: Payer: Self-pay | Admitting: Speech Pathology

## 2019-08-19 DIAGNOSIS — R278 Other lack of coordination: Secondary | ICD-10-CM | POA: Insufficient documentation

## 2019-08-19 DIAGNOSIS — I639 Cerebral infarction, unspecified: Secondary | ICD-10-CM

## 2019-08-19 DIAGNOSIS — R4701 Aphasia: Secondary | ICD-10-CM

## 2019-08-19 DIAGNOSIS — I69318 Other symptoms and signs involving cognitive functions following cerebral infarction: Secondary | ICD-10-CM | POA: Insufficient documentation

## 2019-08-19 DIAGNOSIS — R41841 Cognitive communication deficit: Secondary | ICD-10-CM | POA: Insufficient documentation

## 2019-08-19 DIAGNOSIS — M6281 Muscle weakness (generalized): Secondary | ICD-10-CM | POA: Insufficient documentation

## 2019-08-19 LAB — CUP PACEART REMOTE DEVICE CHECK
Date Time Interrogation Session: 20210808021241
Implantable Pulse Generator Implant Date: 20210602

## 2019-08-19 NOTE — Therapy (Signed)
[facility] [address] [place], [place], [address] Phone: [phone]   Fax:  [phone]  Occupational Therapy Treatment  Patient Details  Name: William Ellison MRN: [id number] Date of Birth: [birth year] Referring Provider (OT): Dr. Berline Chough   Encounter Date: 08/19/2019   OT End of Session - 08/19/19 0820    Visit Number 2    Number of Visits 13    Date for OT Re-Evaluation 09/25/19    Authorization Type BC/BS - 30 visit limit (combined OT/PT)    OT Start Time 0805    OT Stop Time 0845    OT Time Calculation (min) 40 min    Activity Tolerance Patient tolerated treatment well           Past Medical History:  Diagnosis Date   Erectile dysfunction    Hypertension    Skin rash     Past Surgical History:  Procedure Laterality Date   BUBBLE STUDY  06/12/2019   Procedure: BUBBLE STUDY;  Surgeon: Chilton Si, MD;  Location: [facility] ENDOSCOPY;  Service: Cardiovascular;;   LOOP RECORDER INSERTION N/A 06/12/2019   Procedure: LOOP RECORDER INSERTION;  Surgeon: Regan Lemming, MD;  Location: [facility] INVASIVE CV LAB;  Service: Cardiovascular;  Laterality: N/A;   TEE WITHOUT CARDIOVERSION N/A 06/12/2019   Procedure: TRANSESOPHAGEAL ECHOCARDIOGRAM (TEE);  Surgeon: Chilton Si, MD;  Location: [facility] ENDOSCOPY;  Service: Cardiovascular;  Laterality: N/A;    There were no vitals filed for this visit.   Subjective Assessment - 08/19/19 0818    Currently in Pain? Yes    Pain Score 6     Pain Location Shoulder    Pain Orientation Right    Pain Descriptors / Indicators Dull    Pain Type Acute pain    Pain Onset 1 to 4 weeks ago    Pain Frequency Intermittent    Aggravating Factors  raising arm    Pain Relieving Factors rest              Pt reports that he is having a difficult time contacting MD for short-term disability paperwork and was unsure how to contact them.   Pt given contact info (from hospital d/c  summary) for PCP, Rehab MD, and neurologist.  Pt has been seen by neurology, but does not have PCP or Rehab MD follow-up scheduled.  Recommended pt call and make these appointments as well as discuss needed paperwork.   Discussed pt anticipated barriers/difficulties to address prior to return to work.  Pt anticipates that memory and concentration would be difficult, unable to hold up to 50lbs in place for periods of time, reaching out on heights/using boom lifts, using tools and small pieces, and reports incr blurriness with close vision.  Min cues provided.         OT Education - 08/19/19 0834    Education Details coordination and blue putty HEP-see pt instructions    Person(s) Educated Patient    Methods Explanation;Verbal cues;Handout;Demonstration    Comprehension Verbalized understanding;Returned demonstration            OT Short Term Goals - 08/05/19 1212      OT SHORT TERM GOAL #1   Title Independent with HEP for RUE    Time 3    Period Weeks    Status New      OT SHORT TERM GOAL #2   Title Pt to improve grip strength by 10 lbs Rt hand    Baseline 83.5 lbs  Time 3    Period Weeks    Status New      OT SHORT TERM GOAL #3   Title Pt to improve coordination Rt hand as evidenced by reducing speed on 9 hole peg test to 25 sec or less    Baseline 30 sec    Time 3    Period Weeks    Status New      OT SHORT TERM GOAL #4   Title Pt to verbalize understanding with memory compensatory strategies    Time 3    Period Weeks    Status New      OT SHORT TERM GOAL #5   Title Pt to demo independence tying shoes and reports returning to using Rt hand 90% of time for eating/grooming    Time 3    Period Weeks    Status New             OT Long Term Goals - 08/05/19 1217      OT LONG TERM GOAL #1   Title Pt to return to cooking activities with distant supervision    Time 6    Period Weeks    Status New      OT LONG TERM GOAL #2   Title Pt to simulate work  activities with use of tools and lifting/carrying up to 25 lbs    Time 6    Period Weeks    Status New      OT LONG TERM GOAL #3   Title Pt to perform environmental scanning and simple physical task with 90% accuracy or greater    Time 6    Period Weeks    Status New      OT LONG TERM GOAL #4   Title Pt to be independent with theraband HEP    Time 6    Period Weeks    Status New                 Plan - 08/19/19 2706    Clinical Impression Statement Pt verbalized understanding of coordination HEP,  responded well to cueing to avoid shoulder hike with hand use, and demo improved coordination with repetition.    OT Occupational Profile and History Detailed Assessment- Review of Records and additional review of physical, cognitive, psychosocial history related to current functional performance    Occupational performance deficits (Please refer to evaluation for details): ADL's;IADL's;Work    Body Structure / Function / Physical Skills ADL;IADL;Strength;Coordination;UE functional use    Cognitive Skills Memory;Problem Solve;Safety Awareness;Sequencing    Rehab Potential Good    Clinical Decision Making Several treatment options, min-mod task modification necessary    Comorbidities Affecting Occupational Performance: May have comorbidities impacting occupational performance    Modification or Assistance to Complete Evaluation  No modification of tasks or assist necessary to complete eval    OT Frequency 2x / week    OT Duration 6 weeks   PLUS EVAL   OT Treatment/Interventions Self-care/ADL training;Therapeutic exercise;Neuromuscular education;Manual Therapy;Therapeutic activities;Coping strategies training;Cognitive remediation/compensation;Visual/perceptual remediation/compensation;Patient/family education;Moist Heat;Cryotherapy;DME and/or AE instruction    Plan address shoulder pain/strength with HEP and positioning    Consulted and Agree with Plan of Care Patient            Patient will benefit from skilled therapeutic intervention in order to improve the following deficits and impairments:   Body Structure / Function / Physical Skills: ADL, IADL, Strength, Coordination, UE functional use Cognitive Skills: Memory, Problem Solve, Safety Awareness, Sequencing  Visit Diagnosis: Other lack of coordination  Muscle weakness (generalized)  Other symptoms and signs involving cognitive functions following cerebral infarction    Problem List Patient Active Problem List   Diagnosis Date Noted   Sleep disturbance    Morbid obesity (HCC)    Dyslipidemia    Ischemic stroke (HCC) L MCA embolic vs large vessle w/ L M1 occlusion     Benign essential HTN    Prediabetes    Hypokalemia    Stroke (cerebrum) (HCC) 06/07/2019   Hypertensive urgency 06/25/2018   Hypertension 06/25/2018   Blood pressure elevated without history of HTN 06/20/2018   Class 3 severe obesity due to excess calories with serious comorbidity and body mass index (BMI) of 40.0 to 44.9 in adult [facility]) 06/20/2018   Erectile dysfunction 06/20/2018   Skin rash 06/20/2018    [facility] 08/19/2019, 8:44 AM  [facility] [address] [place], [place], [address] Phone: [phone]   Fax:  [phone]  Name: William Ellison MRN: [id number] Date of Birth: 12-22-1960   Willa Frater, OTR/L [facility] [address] [place], [place]  [address] [phone] phone [phone] 08/19/19 8:44 AM

## 2019-08-19 NOTE — Patient Instructions (Addendum)
     Coordination Activities  Perform the following activities for 15-20 minutes 1-2 times per day with right hand(s).   Rotate ball in fingertips (clockwise and counter-clockwise).  Toss ball between hands.  Toss ball in air and catch with the same hand.  Flip cards 1 at a time as fast as you can.  Keep shoulder down.  Deal cards with your thumb (Hold deck in hand and push card off top with thumb).  Rotate card in hand (clockwise and counter-clockwise).  Pick up coins and stack.  Pick up coins one at a time until you get 5-10 in your hand, then move coins from palm to fingertips to place in coin bank one at a time.  Practice writing and/or typing.  Screw together nuts and bolts, then unfasten.  Rotate 2 golf balls in your hand (each direction)  Squeeze putty in hand x20.  Roll out blue putty into log, then pinch with each finger and thumb down length.  Repeat 3x.

## 2019-08-19 NOTE — Therapy (Signed)
[facility] [address] [place], [place], [address] Phone: [phone]   Fax:  [phone]  Speech Language Pathology Treatment  Patient Details  Name: William Ellison MRN: [id number] Date of Birth: 05/07/1960 Referring Provider (SLP): Ihor Austin, NP   Encounter Date: 08/19/2019   End of Session - 08/19/19 1002    Visit Number 2    Number of Visits 17    Date for SLP Re-Evaluation 10/04/19    SLP Start Time 0901    SLP Stop Time  0945    SLP Time Calculation (min) 44 min    Activity Tolerance Patient tolerated treatment well           Past Medical History:  Diagnosis Date  . Erectile dysfunction   . Hypertension   . Skin rash     Past Surgical History:  Procedure Laterality Date  . BUBBLE STUDY  06/12/2019   Procedure: BUBBLE STUDY;  Surgeon: Chilton Si, MD;  Location: [facility] ENDOSCOPY;  Service: Cardiovascular;;  . LOOP RECORDER INSERTION N/A 06/12/2019   Procedure: LOOP RECORDER INSERTION;  Surgeon: Regan Lemming, MD;  [facility] INVASIVE CV LAB;  Service: Cardiovascular;  Laterality: N/A;  . TEE WITHOUT CARDIOVERSION N/A 06/12/2019   Procedure: TRANSESOPHAGEAL ECHOCARDIOGRAM (TEE);  Surgeon: Chilton Si, MD;  Location: [facility] ENDOSCOPY;  Service: Cardiovascular;  Laterality: N/A;    There were no vitals filed for this visit.   Subjective Assessment - 08/19/19 0902    Subjective "I get my second shot"    Currently in Pain? Yes    Pain Score 6     Pain Location Shoulder    Pain Orientation Right    Pain Descriptors / Indicators Dull    Pain Type Acute pain    Pain Onset 1 to 4 weeks ago    Pain Frequency Intermittent                 ADULT SLP TREATMENT - 08/19/19 0903      General Information   Behavior/Cognition Alert;Cooperative;Pleasant mood      Treatment Provided   Treatment provided Cognitive-Linquistic      Cognitive-Linquistic Treatment   Treatment focused on  Cognition;Aphasia;Patient/family/caregiver education    Skilled Treatment Initiated training on compensations for anomia, describing simple objects initially with usual min to mod questioning cues, cues decreased to  occasional min as task progressed. Word finding of professional (air craft) terms - pt named 20 aircraft terms with extended time required frequently, and occasional mod semantic and 1st letter cues on 6/20 words. In verbalizing sequence to start a commercial airplane, pt required extended time with pauses and stammering for word finding. He benefitted from cues to describe where the instrument is, what it looks like and function (SFA) to facilitate word finding. Instructed pt to use descriptions at home to compensate for aphasia.       Assessment / Recommendations / Plan   Plan Continue with current plan of care      Progression Toward Goals   Progression toward goals Progressing toward goals            SLP Education - 08/19/19 0956    Education Details compensations for aphasia    Person(s) Educated Patient    Methods Explanation;Demonstration;Verbal cues;Handout    Comprehension Verbalized understanding;Returned demonstration;Verbal cues required;Need further instruction            SLP Short Term Goals - 08/19/19 1000      SLP SHORT TERM GOAL #  1   Title Pt will use phone/agenda (external aids) to recall/manage his appointments and complete PT/OT HEP with occasional min A over 2 sessions    Time 4    Period Weeks    Status On-going      SLP SHORT TERM GOAL #2   Title Pt will use compensatory strategies to recall location of phone and manage mail successfully, reporting loosing them no more than 3x over a week    Time 4    Period Weeks    Status On-going      SLP SHORT TERM GOAL #3   Title Pt will utilize compensatory strategies in structured naming tasks 18/20 as needed with occasional min A over 2 sessions    Time 4    Period Weeks    Status On-going       SLP SHORT TERM GOAL #4   Title Pt will read 2 paragraph passage and comprehend as measured by answering 4 detail questions with occasional min A over 2 sessions    Time 4    Period Weeks    Status On-going            SLP Long Term Goals - 08/19/19 1002      SLP LONG TERM GOAL #1   Title Pt will complete 3 household chores a day using external aids with rare min A    Time 8    Period Weeks    Status On-going      SLP LONG TERM GOAL #2   Title Pt will report not loosing mail or personal items using external aids 1x or less over 5 days    Time 8    Period Weeks    Status On-going      SLP LONG TERM GOAL #3   Title Pt will uitilize compensations for anomia in 20 minute conversation as needed with rare min A    Time 8    Period Weeks    Status On-going      SLP LONG TERM GOAL #4   Title Pt will ID and correct written errors in 4-5 sentence paragraph, email or text with rare min A    Time 8    Period Weeks    Status New      SLP LONG TERM GOAL #5   Title Pt will comprend 4 paragraph email or passage answering detail questions with 90% accuracy and rare min A    Time 8    Period Weeks    Status On-going            Plan - 08/19/19 0957    Clinical Impression Statement 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 Pt continues to require A from spouse for schedule management, daily chores, and reminder for appointments. Ongoing training for word finding and compensations for aphasia and cognition. Continue skilled ST to maximize communication, cognition for safety, QOL and reduce caregiver burden    Speech Therapy Frequency 2x / week    Duration --   8 weeks or 17 visits   Treatment/Interventions Compensatory strategies;Patient/family education;Functional tasks;Cueing hierarchy;Multimodal communcation approach;Cognitive reorganization;Environmental controls;SLP instruction and feedback;Internal/external  aids;Compensatory techniques;Language facilitation    Potential to Achieve Goals Good           Patient will benefit from skilled therapeutic intervention in order to improve the following deficits and impairments:   Aphasia  Cognitive communication deficit    Problem List Patient Active Problem List  Diagnosis Date Noted  . Sleep disturbance   . Morbid obesity (HCC)   . Dyslipidemia   . Ischemic stroke (HCC) L MCA embolic vs large vessle w/ L M1 occlusion    . Benign essential HTN   . Prediabetes   . Hypokalemia   . 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 Hypertensive urgency 06/25/2018  . Hypertension 06/25/2018  . Blood pressure elevated without history of HTN 06/20/2018  . Class 3 severe obesity due to excess calories with serious comorbidity and body mass index (BMI) of 40.0 to 44.9 in adult (HCC) 06/20/2018  . Erectile dysfunction 06/20/2018  . Skin rash 06/20/2018    Einar Nolasco, Radene Journey MS, CCC-SLP 08/19/2019, 10:03 AM  [facility] [address] [place], [place], [address] Phone: [phone]   Fax:  [phone]   Name: Waller Marcussen MRN: [id number] Date of Birth: [birth year]

## 2019-08-19 NOTE — Patient Instructions (Addendum)
   3 ring binder with sections for MD, PT, OT, ST  Describe the word/item when you can't think of it - family has to give you a chance to use a description before they jump in  Bring HW and binder in each session

## 2019-08-20 NOTE — Progress Notes (Signed)
Carelink Summary Report / Loop Recorder 

## 2019-08-21 ENCOUNTER — Ambulatory Visit: Payer: Self-pay

## 2019-08-21 ENCOUNTER — Other Ambulatory Visit: Payer: Self-pay

## 2019-08-21 ENCOUNTER — Encounter: Payer: Self-pay | Admitting: Family Medicine

## 2019-08-21 ENCOUNTER — Telehealth: Payer: Self-pay | Admitting: Family Medicine

## 2019-08-21 ENCOUNTER — Ambulatory Visit: Payer: Self-pay | Admitting: Occupational Therapy

## 2019-08-21 ENCOUNTER — Telehealth (INDEPENDENT_AMBULATORY_CARE_PROVIDER_SITE_OTHER): Payer: Self-pay | Admitting: Family Medicine

## 2019-08-21 DIAGNOSIS — Z09 Encounter for follow-up examination after completed treatment for conditions other than malignant neoplasm: Secondary | ICD-10-CM

## 2019-08-21 DIAGNOSIS — F419 Anxiety disorder, unspecified: Secondary | ICD-10-CM

## 2019-08-21 DIAGNOSIS — I639 Cerebral infarction, unspecified: Secondary | ICD-10-CM

## 2019-08-21 NOTE — Progress Notes (Signed)
Virtual Visit via Telephone Note  I connected with William Ellison on 08/21/19 at  1:40 PM EDT by telephone and verified that I am speaking with the correct person using two identifiers.    I discussed the limitations, risks, security and privacy concerns of performing an evaluation and management service by telephone and the availability of in person appointments. I also discussed with the patient that there may be a patient responsible charge related to this service. The patient expressed understanding and agreed to proceed.   Televisit Today Patient Location: Home Provider Location: Office   History of Present Illness:  Past Surgical History:  Procedure Laterality Date  . BUBBLE STUDY  06/12/2019   Procedure: BUBBLE STUDY;  Surgeon: Chilton Si, MD;  Location: [facility] ENDOSCOPY;  Service: Cardiovascular;;  . LOOP RECORDER INSERTION N/A 06/12/2019   Procedure: LOOP RECORDER INSERTION;  Surgeon: Regan Lemming, MD;  [facility] INVASIVE CV LAB;  Service: Cardiovascular;  Laterality: N/A;  . TEE WITHOUT CARDIOVERSION N/A 06/12/2019   Procedure: TRANSESOPHAGEAL ECHOCARDIOGRAM (TEE);  Surgeon: Chilton Si, MD;  Location: [facility] ENDOSCOPY;  Service: Cardiovascular;  Laterality: N/A;    Social History   Socioeconomic History  . Marital status: Significant Other    Spouse name: Not on file  . Number of children: Not on file  . Years of education: Not on file  . Highest education level: Not on file  Occupational History  . Not on file  Tobacco Use  . Smoking status: Never Smoker  . Smokeless tobacco: Never Used  Vaping Use  . Vaping Use: Never used  Substance and Sexual Activity  . Alcohol use: Yes  . Drug use: Never  . Sexual activity: Yes  Other Topics Concern  . Not on file  Social History Narrative  . Not on file   Social Determinants of Health   Financial Resource Strain:   . Difficulty of Paying Living Expenses:   Food Insecurity:   . Worried About Community education officer in the Last Year:   . Barista in the Last Year:   Transportation Needs:   . Freight forwarder (Medical):   Marland Kitchen Lack of Transportation (Non-Medical):   Physical Activity:   . Days of Exercise per Week:   . Minutes of Exercise per Session:   Stress:   . Feeling of Stress :   Social Connections:   . Frequency of Communication with Friends and Family:   . Frequency of Social Gatherings with Friends and Family:   . Attends Religious Services:   . Active Member of Clubs or Organizations:   . Attends Banker Meetings:   Marland Kitchen Marital Status:   Intimate Partner Violence:   . Fear of Current or Ex-Partner:   . Emotionally Abused:   Marland Kitchen Physically Abused:   . Sexually Abused:    Family History  Problem Relation Age of Onset  . Hypertension Mother    No Known Allergies   Past Medical History:  Diagnosis Date  . Acute ischemic stroke (HCC) 05/2019  . Anxiety   . Erectile dysfunction   . Hypertension   . Skin rash     Current Outpatient Medications on File Prior to Visit  Medication Sig Dispense Refill  . acetaminophen (TYLENOL) 325 MG tablet Take 1-2 tablets (325-650 mg total) by mouth every 4 (four) hours as needed for mild pain.    Marland Kitchen amantadine (SYMMETREL) 100 MG capsule Take 1 capsule (100 mg total) by mouth daily. 30  capsule 2  . amLODipine (NORVASC) 5 MG tablet Take 1 tablet (5 mg total) by mouth daily. 90 tablet 0  . aspirin 325 MG EC tablet Take 1 tablet (325 mg total) by mouth daily. 100 tablet 0  . atorvastatin (LIPITOR) 80 MG tablet Take 1 tablet (80 mg total) by mouth daily. 90 tablet 3  . clopidogrel (PLAVIX) 75 MG tablet Take 1 tablet (75 mg total) by mouth daily. 30 tablet 0   No current facility-administered medications on file prior to visit.    Current Status: Since he last office visit, he is doing well with no complaints. He is inquiring to have disability forms completed at this time. Patient has not been seen in our office  since 06/20/2018. He does have some cognitive delays and extremity weakness since Acute Ischemic St[place] on 06/12/2019, where he was hospitalized for 9 days. He has also underwent some Physical Therapy treatments. Hedenies fevers, chills, fatigue, recent infections, weight loss, and night sweats. He has not had any visual changes, and falls. No chest pain, heart palpitations, cough and shortness of breath reported. Denies GI problems such as nausea, vomiting, diarrhea, and constipation. He has no reports of blood in stools, dysuria and hematuria. His anxiety is moderate today r/t his healthcare status. He denies suicidal ideations, homicidal ideations, or auditory hallucinations. He is taking all medications as prescribed. He denies pain today.     Observations/Objective:  Telephone Visit  Assessment and Plan:  1. Hospital discharge follow-up  2. Acute ischemic stroke (HCC) No signs or symptoms of recurrence reported today. Monitor.   3. Anxiety  4. Follow up He will follow up for Office Visit.    No orders of the defined types were placed in this encounter.   No orders of the defined types were placed in this encounter.   Referral Orders  No referral(s) requested today    Raliegh Ip,  MSN, FNP-BC [facility]/Internal Medicine/[facility] [address] [place], [place] [address] [phone]- fax    I discussed the assessment and treatment plan with the patient. The patient was provided an opportunity to ask questions and all were answered. The patient agreed with the plan and demonstrated an understanding of the instructions.   The patient was advised to call back or seek an in-person evaluation if the symptoms worsen or if the condition fails to improve as anticipated.  I provided 20 minutes of non-face-to-face time during this encounter.   Kallie Locks, FNP

## 2019-08-21 NOTE — Telephone Encounter (Signed)
error 

## 2019-08-22 ENCOUNTER — Telehealth: Payer: Self-pay

## 2019-08-22 NOTE — Telephone Encounter (Signed)
Ptn left voicemail that he needs Dr Allena Katz to fill out disability forms - Dr Allena Katz is not MD that was to follow - it was ML - appt was set up for Tc with Riley Lam - ptn no showed.  Will need primary care to do any forms necessary as are not treating.

## 2019-08-23 ENCOUNTER — Encounter: Payer: Self-pay | Admitting: Family Medicine

## 2019-08-26 ENCOUNTER — Ambulatory Visit: Payer: Self-pay | Admitting: Speech Pathology

## 2019-08-26 ENCOUNTER — Telehealth: Payer: Self-pay | Admitting: Occupational Therapy

## 2019-08-26 ENCOUNTER — Ambulatory Visit: Payer: Self-pay | Admitting: Occupational Therapy

## 2019-08-26 NOTE — Telephone Encounter (Signed)
Attempted to contact pt regarding no shows for 2 OT appts, but phone number invalid.

## 2019-08-27 ENCOUNTER — Encounter: Payer: Self-pay | Admitting: Family Medicine

## 2019-08-27 ENCOUNTER — Other Ambulatory Visit: Payer: Self-pay

## 2019-08-27 ENCOUNTER — Ambulatory Visit (INDEPENDENT_AMBULATORY_CARE_PROVIDER_SITE_OTHER): Payer: Self-pay | Admitting: Family Medicine

## 2019-08-27 ENCOUNTER — Other Ambulatory Visit: Payer: Self-pay | Admitting: Adult Health

## 2019-08-27 VITALS — BP 128/82 | HR 94 | Temp 97.8°F | Ht 70.0 in | Wt 267.0 lb

## 2019-08-27 DIAGNOSIS — Z6841 Body Mass Index (BMI) 40.0 and over, adult: Secondary | ICD-10-CM

## 2019-08-27 DIAGNOSIS — R531 Weakness: Secondary | ICD-10-CM

## 2019-08-27 DIAGNOSIS — Z09 Encounter for follow-up examination after completed treatment for conditions other than malignant neoplasm: Secondary | ICD-10-CM

## 2019-08-27 DIAGNOSIS — F419 Anxiety disorder, unspecified: Secondary | ICD-10-CM

## 2019-08-27 DIAGNOSIS — I10 Essential (primary) hypertension: Secondary | ICD-10-CM

## 2019-08-27 DIAGNOSIS — R634 Abnormal weight loss: Secondary | ICD-10-CM

## 2019-08-27 DIAGNOSIS — Z8673 Personal history of transient ischemic attack (TIA), and cerebral infarction without residual deficits: Secondary | ICD-10-CM

## 2019-08-27 DIAGNOSIS — E66813 Obesity, class 3: Secondary | ICD-10-CM

## 2019-08-27 NOTE — Progress Notes (Signed)
[facility]   Established Patient Office Visit  Subjective:  Patient ID: William Ellison, male    DOB: [birth year]  Age: 59 y.o. MRN: [id number]  CC:  Chief Complaint  Patient presents with  . Follow-up    f'\u from stroke, also wants to know when can go back to work    HPI William Ellison is a 59 year old male who presents for Follow Up today.    Patient Active Problem List   Diagnosis Date Noted  . Sleep disturbance   . Morbid obesity (HCC)   . Dyslipidemia   . Ischemic stroke (HCC) L MCA embolic vs large vessle w/ L M1 occlusion    . Benign essential HTN   . Prediabetes   . Hypokalemia   . 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 Hypertensive urgency 06/25/2018  . Hypertension 06/25/2018  . Blood pressure elevated without history of HTN 06/20/2018  . Class 3 severe obesity due to excess calories with serious comorbidity and body mass index (BMI) of 40.0 to 44.9 in adult (HCC) 06/20/2018  . Erectile dysfunction 06/20/2018  . Skin rash 06/20/2018   Current Status: Since his last office visit, he is doing well with no complaints. He has had a significant weight loss since his last office visit. He continues to be out of work since his stroke on 06/07/2019. He denies fevers, chills, fatigue, recent infections, weight loss, and night sweats. He has not had any headaches, visual changes, dizziness, and falls. No chest pain, heart palpitations, cough and shortness of breath reported. Denies GI problems such as nausea, vomiting, diarrhea, and constipation. He has no reports of blood in stools, dysuria and hematuria. No depression or anxiety, and denies suicidal ideations, homicidal ideations, or auditory hallucinations. He is He all medications as prescribed. He denies pain today.   Past Medical History:  Diagnosis Date  . Acute ischemic stroke (HCC) 05/2019  . Anxiety   . Erectile dysfunction   . Hypertension   . Skin rash      Past Surgical History:  Procedure Laterality Date  . BUBBLE STUDY  06/12/2019   Procedure: BUBBLE STUDY;  Surgeon: Chilton Si, MD;  Location: [facility] ENDOSCOPY;  Service: Cardiovascular;;  . LOOP RECORDER INSERTION N/A 06/12/2019   Procedure: LOOP RECORDER INSERTION;  Surgeon: Regan Lemming, MD;  Location: [facility] INVASIVE CV LAB;  Service: Cardiovascular;  Laterality: N/A;  . TEE WITHOUT CARDIOVERSION N/A 06/12/2019   Procedure: TRANSESOPHAGEAL ECHOCARDIOGRAM (TEE);  Surgeon: Chilton Si, MD;  Location: [facility] ENDOSCOPY;  Service: Cardiovascular;  Laterality: N/A;    Family History  Problem Relation Age of Onset  . Hypertension Mother     Social History   Socioeconomic History  . Marital status: Significant Other    Spouse name: Not on file  . Number of children: Not on file  . Years of education: Not on file  . Highest education level: Not on file  Occupational History  . Not on file  Tobacco Use  . Smoking status: Never Smoker  . Smokeless tobacco: Never Used  Vaping Use  . Vaping Use: Never used  Substance and Sexual Activity  . Alcohol use: Yes  . Drug use: Never  . Sexual activity: Yes  Other Topics Concern  . Not on file  Social History Narrative  . Not on file   Social Determinants of Health   Financial Resource Strain:   . Difficulty of Paying Living Expenses:   Food  Insecurity:   . Worried About Programme researcher, broadcasting/film/videounning Out of Food in the Last Year:   . Baristaan Out of Food in the Last Year:   Transportation Needs:   . Freight forwarderLack of Transportation (Medical):   Marland Kitchen. Lack of Transportation (Non-Medical):   Physical Activity:   . Days of Exercise per Week:   . Minutes of Exercise per Session:   Stress:   . Feeling of Stress :   Social Connections:   . Frequency of Communication with Friends and Family:   . Frequency of Social Gatherings with Friends and Family:   . Attends Religious Services:   . Active Member of Clubs or Organizations:   . Attends BankerClub or Organization  Meetings:   Marland Kitchen. Marital Status:   Intimate Partner Violence:   . Fear of Current or Ex-Partner:   . Emotionally Abused:   Marland Kitchen. Physically Abused:   . Sexually Abused:     Outpatient Medications Prior to Visit  Medication Sig Dispense Refill  . amantadine (SYMMETREL) 100 MG capsule Take 1 capsule (100 mg total) by mouth daily. 30 capsule 2  . amLODipine (NORVASC) 5 MG tablet Take 1 tablet (5 mg total) by mouth daily. 90 tablet 0  . aspirin 325 MG EC tablet Take 1 tablet (325 mg total) by mouth daily. 100 tablet 0  . atorvastatin (LIPITOR) 80 MG tablet Take 1 tablet (80 mg total) by mouth daily. 90 tablet 3  . clopidogrel (PLAVIX) 75 MG tablet Take 1 tablet (75 mg total) by mouth daily. 30 tablet 0  . acetaminophen (TYLENOL) 325 MG tablet Take 1-2 tablets (325-650 mg total) by mouth every 4 (four) hours as needed for mild pain. (Patient not taking: Reported on 08/27/2019)     No facility-administered medications prior to visit.    No Known Allergies  ROS Review of Systems  Constitutional: Negative.   HENT: Negative.   Eyes: Negative.   Respiratory: Negative.   Gastrointestinal: Positive for abdominal distention (obese).  Endocrine: Negative.   Genitourinary: Negative.   Musculoskeletal: Positive for arthralgias (generalized joint pain).  Skin: Negative.   Allergic/Immunologic: Negative.   Neurological: Positive for dizziness (occasional ) and headaches (occasional ).  Psychiatric/Behavioral: Negative.       Objective:    Physical Exam Nursing note reviewed.  Constitutional:      Appearance: Normal appearance.  HENT:     Head: Normocephalic and atraumatic.     Nose: Nose normal.     Mouth/Throat:     Mouth: Mucous membranes are moist.     Pharynx: Oropharynx is clear.  Cardiovascular:     Rate and Rhythm: Normal rate and regular rhythm.     Pulses: Normal pulses.     Heart sounds: Normal heart sounds.  Pulmonary:     Effort: Pulmonary effort is normal.     Breath  sounds: Normal breath sounds.  Abdominal:     General: Bowel sounds are normal. There is distension.     Palpations: Abdomen is soft.  Musculoskeletal:        General: Normal range of motion.     Cervical back: Normal range of motion and neck supple.  Skin:    General: Skin is warm.  Neurological:     General: No focal deficit present.     Mental Status: He is oriented to person, place, and time.  Psychiatric:        Mood and Affect: Mood normal.        Behavior: Behavior normal.  Thought Content: Thought content normal.        Judgment: Judgment normal.     BP 128/82   Pulse 94   Temp 97.8 F (36.6 C)   Ht 5\' 10"  (1.778 m)   Wt 267 lb (121.1 kg)   SpO2 98%   BMI 38.31 kg/m  Wt Readings from Last 3 Encounters:  08/27/19 267 lb (121.1 kg)  07/31/19 274 lb 12.8 oz (124.6 kg)  06/14/19 296 lb 4.8 oz (134.4 kg)     Health Maintenance Due  Topic Date Due  . Hepatitis C Screening  Never done  . TETANUS/TDAP  Never done  . COLONOSCOPY  Never done  . INFLUENZA VACCINE  08/11/2019    There are no preventive care reminders to display for this patient.  No results found for: TSH Lab Results  Component Value Date   WBC 5.9 06/17/2019   HGB 15.7 06/17/2019   HCT 46.8 06/17/2019   MCV 93.4 06/17/2019   PLT 320 06/17/2019   Lab Results  Component Value Date   NA 139 06/17/2019   K 3.6 06/17/2019   CO2 30 06/17/2019   GLUCOSE 129 (H) 06/17/2019   BUN 11 06/17/2019   CREATININE 1.26 (H) 06/17/2019   BILITOT 1.4 (H) 06/13/2019   ALKPHOS 47 06/13/2019   AST 26 06/13/2019   ALT 36 06/13/2019   PROT 7.1 06/13/2019   ALBUMIN 3.6 06/13/2019   CALCIUM 9.5 06/17/2019   ANIONGAP 9 06/17/2019   Lab Results  Component Value Date   CHOL 198 06/07/2019   Lab Results  Component Value Date   HDL 37 (L) 06/07/2019   Lab Results  Component Value Date   LDLCALC 148 (H) 06/07/2019   Lab Results  Component Value Date   TRIG 67 06/07/2019   Lab Results   Component Value Date   CHOLHDL 5.4 06/07/2019   Lab Results  Component Value Date   HGBA1C 6.2 (H) 06/07/2019      Assessment & Plan:   1. History of ischemic stroke Stab;e. No signs or symptoms of recurrence noted or reported today.   2. Right sided weakness  3. Class 3 severe obesity due to excess calories with serious comorbidity and body mass index (BMI) of 40.0 to 44.9 in adult [facility]) Body mass index is 38.31 kg/m.  Goal BMI  is <30. Encouraged efforts to reduce weight include engaging in physical activity as tolerated with goal of 150 minutes per week. Improve dietary choices and eat a meal regimen consistent with a Mediterranean or DASH diet. Reduce simple carbohydrates. Do not skip meals and eat healthy snacks throughout the day to avoid over-eating at dinner. Set a goal weight loss that is achievable for you.  4. Hypertension, unspecified type The current medical regimen is effective; blood pressure is stable at 128/82 today; continue present plan and medications as prescribed. He will continue to take medications as prescribed, to decrease high sodium intake, excessive alcohol intake, increase potassium intake, smoking cessation, and increase physical activity of at least 30 minutes of cardio activity daily. He will continue to follow Heart Healthy or DASH diet.  5. Weight loss  6. Anxiety  7. Follow up He will follow up in 6 months.   No orders of the defined types were placed in this encounter.   No orders of the defined types were placed in this encounter.   [facility],  MSN, FNP-BC [place] [facility] [phone]  [address]  [place], [place] [address] [phone] fax  Problem List Items Addressed This Visit      Cardiovascular and Mediastinum   Hypertension     Other   Class 3 severe obesity due to excess calories with serious comorbidity and body mass index (BMI) of  40.0 to 44.9 in adult [facility])    Other Visit Diagnoses    History of ischemic stroke    -  Primary   Right sided weakness       Weight loss       Anxiety       Follow up          No orders of the defined types were placed in this encounter.   Follow-up: Return in about 6 months (around 02/27/2020).    Kallie Locks, FNP

## 2019-08-28 ENCOUNTER — Ambulatory Visit: Payer: Self-pay | Admitting: Occupational Therapy

## 2019-08-28 ENCOUNTER — Encounter: Payer: Self-pay | Admitting: Family Medicine

## 2019-08-28 ENCOUNTER — Ambulatory Visit: Payer: Self-pay | Admitting: Speech Pathology

## 2019-08-28 NOTE — Therapy (Signed)
[facility] [address] [place], [place], [address] Phone: [phone]   Fax:  [phone]  Patient Details  Name: Zakir Henner MRN: [id number] Date of Birth: 1960/01/23 Referring Provider:  No ref. provider found  Encounter Date: 08/28/2019  Pt will be discharged from this episode of care secondary to not returning after initial evaluation and 1 treatment session. Therapist has tried to contact patient but phone number is invalid.   Kelli Churn, OTR/L 08/28/2019, 2:28 PM  [facility] [address] [place], [place], [address] Phone: [phone]   Fax:  [phone]

## 2019-09-03 ENCOUNTER — Encounter: Payer: BC Managed Care – PPO | Admitting: Speech Pathology

## 2019-09-03 ENCOUNTER — Encounter: Payer: BC Managed Care – PPO | Admitting: Occupational Therapy

## 2019-09-05 ENCOUNTER — Encounter: Payer: BC Managed Care – PPO | Admitting: Speech Pathology

## 2019-09-05 ENCOUNTER — Encounter: Payer: BC Managed Care – PPO | Admitting: Occupational Therapy

## 2019-09-09 ENCOUNTER — Encounter: Payer: BC Managed Care – PPO | Admitting: Occupational Therapy

## 2019-09-09 ENCOUNTER — Encounter: Payer: BC Managed Care – PPO | Admitting: Speech Pathology

## 2019-09-11 ENCOUNTER — Telehealth: Payer: Self-pay | Admitting: Family Medicine

## 2019-09-11 ENCOUNTER — Encounter: Payer: BC Managed Care – PPO | Admitting: Occupational Therapy

## 2019-09-11 NOTE — Telephone Encounter (Signed)
Several attempts where made by myself and Jolene Schimke to contact William Ellison about his paperwork for Pt to come pick up his paperwork this is for his employer

## 2019-09-13 ENCOUNTER — Telehealth: Payer: Self-pay | Admitting: Family Medicine

## 2019-09-13 NOTE — Telephone Encounter (Signed)
A letter was mail out yesterday 09/13/19 to inform Pt the contact infomation we have in his chart is not valid so we had no way of contacting him concerning his paperwork myself and provider Jolene Schimke have tried several times to reach out to this Pt

## 2019-09-18 ENCOUNTER — Encounter: Payer: Self-pay | Admitting: Neurology

## 2019-09-18 ENCOUNTER — Institutional Professional Consult (permissible substitution): Payer: BC Managed Care – PPO | Admitting: Neurology

## 2019-09-22 ENCOUNTER — Other Ambulatory Visit: Payer: Self-pay | Admitting: Adult Health

## 2019-09-23 ENCOUNTER — Ambulatory Visit (INDEPENDENT_AMBULATORY_CARE_PROVIDER_SITE_OTHER): Payer: BC Managed Care – PPO | Admitting: *Deleted

## 2019-09-23 DIAGNOSIS — I639 Cerebral infarction, unspecified: Secondary | ICD-10-CM

## 2019-09-23 LAB — CUP PACEART REMOTE DEVICE CHECK
Date Time Interrogation Session: 20210910021348
Implantable Pulse Generator Implant Date: 20210602

## 2019-09-25 NOTE — Progress Notes (Signed)
Carelink Summary Report / Loop Recorder 

## 2019-10-07 ENCOUNTER — Telehealth: Payer: Self-pay | Admitting: Family Medicine

## 2019-10-07 NOTE — Telephone Encounter (Signed)
Multiple attempts to contact patient concerning forms faxed to our office for completion. Unable to leave message.

## 2019-10-23 LAB — CUP PACEART REMOTE DEVICE CHECK
Date Time Interrogation Session: 20211013021628
Implantable Pulse Generator Implant Date: 20210602

## 2019-10-28 ENCOUNTER — Ambulatory Visit (INDEPENDENT_AMBULATORY_CARE_PROVIDER_SITE_OTHER): Payer: BC Managed Care – PPO

## 2019-10-28 DIAGNOSIS — I639 Cerebral infarction, unspecified: Secondary | ICD-10-CM

## 2019-10-30 NOTE — Progress Notes (Signed)
Carelink Summary Report / Loop Recorder 

## 2019-10-31 ENCOUNTER — Other Ambulatory Visit: Payer: Self-pay | Admitting: Family Medicine

## 2019-10-31 DIAGNOSIS — I10 Essential (primary) hypertension: Secondary | ICD-10-CM

## 2019-11-25 ENCOUNTER — Ambulatory Visit (INDEPENDENT_AMBULATORY_CARE_PROVIDER_SITE_OTHER): Payer: Self-pay

## 2019-11-25 DIAGNOSIS — I639 Cerebral infarction, unspecified: Secondary | ICD-10-CM

## 2019-11-25 LAB — CUP PACEART REMOTE DEVICE CHECK
Date Time Interrogation Session: 20211115011712
Implantable Pulse Generator Implant Date: 20210602

## 2019-11-26 ENCOUNTER — Encounter: Payer: Self-pay | Admitting: Adult Health

## 2019-11-26 ENCOUNTER — Ambulatory Visit: Payer: BC Managed Care – PPO | Admitting: Adult Health

## 2019-11-26 NOTE — Progress Notes (Signed)
Carelink Summary Report / Loop Recorder 

## 2019-12-27 ENCOUNTER — Ambulatory Visit (INDEPENDENT_AMBULATORY_CARE_PROVIDER_SITE_OTHER): Payer: Self-pay

## 2019-12-27 DIAGNOSIS — I63412 Cerebral infarction due to embolism of left middle cerebral artery: Secondary | ICD-10-CM

## 2019-12-29 LAB — CUP PACEART REMOTE DEVICE CHECK
Date Time Interrogation Session: 20211218011724
Implantable Pulse Generator Implant Date: 20210602

## 2020-01-07 ENCOUNTER — Telehealth: Payer: Self-pay | Admitting: Family Medicine

## 2020-01-07 NOTE — Telephone Encounter (Signed)
Message left on patient's voicemail to return call concerning paperwork dropped off last week.

## 2020-01-09 NOTE — Progress Notes (Signed)
Carelink Summary Report / Loop Recorder 

## 2020-01-28 ENCOUNTER — Ambulatory Visit (INDEPENDENT_AMBULATORY_CARE_PROVIDER_SITE_OTHER): Payer: Self-pay

## 2020-01-28 DIAGNOSIS — I639 Cerebral infarction, unspecified: Secondary | ICD-10-CM

## 2020-01-30 LAB — CUP PACEART REMOTE DEVICE CHECK
Date Time Interrogation Session: 20220120014258
Implantable Pulse Generator Implant Date: 20210602

## 2020-02-11 NOTE — Progress Notes (Signed)
Carelink Summary Report / Loop Recorder 

## 2020-02-26 ENCOUNTER — Ambulatory Visit: Payer: Self-pay | Admitting: Family Medicine

## 2020-03-02 ENCOUNTER — Ambulatory Visit (INDEPENDENT_AMBULATORY_CARE_PROVIDER_SITE_OTHER): Payer: Self-pay

## 2020-03-02 DIAGNOSIS — I63412 Cerebral infarction due to embolism of left middle cerebral artery: Secondary | ICD-10-CM

## 2020-03-03 LAB — CUP PACEART REMOTE DEVICE CHECK
Date Time Interrogation Session: 20220222015410
Implantable Pulse Generator Implant Date: 20210602

## 2020-03-06 NOTE — Progress Notes (Signed)
Carelink Summary Report / Loop Recorder 

## 2020-03-13 ENCOUNTER — Telehealth: Payer: Self-pay | Admitting: Family Medicine

## 2020-03-13 NOTE — Telephone Encounter (Signed)
Pt called requesting note to return to work Monday 03/16/20 with no restrictions.

## 2020-03-16 ENCOUNTER — Ambulatory Visit (INDEPENDENT_AMBULATORY_CARE_PROVIDER_SITE_OTHER): Payer: Self-pay | Admitting: Family Medicine

## 2020-03-16 ENCOUNTER — Other Ambulatory Visit: Payer: Self-pay

## 2020-03-16 ENCOUNTER — Encounter: Payer: Self-pay | Admitting: Family Medicine

## 2020-03-16 VITALS — BP 184/104 | HR 85 | Ht 70.0 in | Wt 264.0 lb

## 2020-03-16 DIAGNOSIS — I160 Hypertensive urgency: Secondary | ICD-10-CM

## 2020-03-16 DIAGNOSIS — R634 Abnormal weight loss: Secondary | ICD-10-CM

## 2020-03-16 DIAGNOSIS — Z6837 Body mass index (BMI) 37.0-37.9, adult: Secondary | ICD-10-CM

## 2020-03-16 DIAGNOSIS — I10 Essential (primary) hypertension: Secondary | ICD-10-CM

## 2020-03-16 DIAGNOSIS — F419 Anxiety disorder, unspecified: Secondary | ICD-10-CM

## 2020-03-16 DIAGNOSIS — Z09 Encounter for follow-up examination after completed treatment for conditions other than malignant neoplasm: Secondary | ICD-10-CM

## 2020-03-16 DIAGNOSIS — Z8673 Personal history of transient ischemic attack (TIA), and cerebral infarction without residual deficits: Secondary | ICD-10-CM

## 2020-03-16 MED ORDER — CLONIDINE HCL 0.2 MG PO TABS
0.2000 mg | ORAL_TABLET | Freq: Once | ORAL | Status: AC
  Administered 2020-03-16: 0.2 mg via ORAL

## 2020-03-16 MED ORDER — ATORVASTATIN CALCIUM 80 MG PO TABS: 80.0000 mg | ORAL_TABLET | Freq: Every day | ORAL | 3 refills | Status: DC

## 2020-03-16 MED ORDER — CLOPIDOGREL BISULFATE 75 MG PO TABS: 75.0000 mg | ORAL_TABLET | Freq: Every day | ORAL | 3 refills | Status: DC

## 2020-03-16 MED ORDER — AMLODIPINE BESYLATE 5 MG PO TABS: 5.0000 mg | ORAL_TABLET | Freq: Every day | ORAL | 3 refills | Status: DC

## 2020-03-16 NOTE — Progress Notes (Signed)
[facility]   Established Patient Office Visit  Subjective:  Patient ID: William Ellison, male    DOB: 11/29/1960  Age: 60 y.o. MRN: [id number]  CC:  Chief Complaint  Patient presents with  . Follow-up    Wants to return to work    HPI William Ellison is a 60 year old male who presents for Follow Up today.    Patient Active Problem List   Diagnosis Date Noted  . Sleep disturbance   . Morbid obesity (HCC)   . Dyslipidemia   . Ischemic stroke (HCC) L MCA embolic vs large vessle w/ L M1 occlusion    . Benign essential HTN   . Prediabetes   . Hypokalemia   . 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 Hypertensive urgency 06/25/2018  . Hypertension 06/25/2018  . Blood pressure elevated without history of HTN 06/20/2018  . Class 3 severe obesity due to excess calories with serious comorbidity and body mass index (BMI) of 40.0 to 44.9 in adult (HCC) 06/20/2018  . Erectile dysfunction 06/20/2018  . Skin rash 06/20/2018   Current Status: Since his last office visit, he is doing well with no complaints. His blood pressure readings are elevated today. He states that he has not been taking his medications as prescribed since 11/2019 because of financial reasons. He denies visual changes, chest pain, cough, shortness of breath, heart palpitations, and falls. He has occasional headaches and dizziness with position changes. Denies severe headaches, confusion, seizures, double vision, and blurred vision, nausea and vomiting. He denies fevers, chills, fatigue, recent infections, weight loss, and night sweats. Denies GI problems such as diarrhea, and constipation. He has no reports of blood in stools, dysuria and hematuria. No depression or anxiety today. He is taking all medications as prescribed. He denies pain today.   Past Medical History:  Diagnosis Date  . Acute ischemic stroke (HCC) 05/2019  . Anxiety   . Erectile dysfunction   .  Hypertension   . Skin rash     Past Surgical History:  Procedure Laterality Date  . BUBBLE STUDY  06/12/2019   Procedure: BUBBLE STUDY;  Surgeon: Chilton Si, MD;  Location: [facility] ENDOSCOPY;  Service: Cardiovascular;;  . LOOP RECORDER INSERTION N/A 06/12/2019   Procedure: LOOP RECORDER INSERTION;  Surgeon: Regan Lemming, MD;  Location: [facility] INVASIVE CV LAB;  Service: Cardiovascular;  Laterality: N/A;  . TEE WITHOUT CARDIOVERSION N/A 06/12/2019   Procedure: TRANSESOPHAGEAL ECHOCARDIOGRAM (TEE);  Surgeon: Chilton Si, MD;  Location: [facility] ENDOSCOPY;  Service: Cardiovascular;  Laterality: N/A;    Family History  Problem Relation Age of Onset  . Hypertension Mother     Social History   Socioeconomic History  . Marital status: Significant Other    Spouse name: Not on file  . Number of children: Not on file  . Years of education: Not on file  . Highest education level: Not on file  Occupational History  . Not on file  Tobacco Use  . Smoking status: Never Smoker  . Smokeless tobacco: Never Used  Vaping Use  . Vaping Use: Never used  Substance and Sexual Activity  . Alcohol use: Yes  . Drug use: Never  . Sexual activity: Yes  Other Topics Concern  . Not on file  Social History Narrative  . Not on file   Social Determinants of Health   Financial Resource Strain: Not on file  Food Insecurity: Not on file  Transportation Needs: Not on  file  Physical Activity: Not on file  Stress: Not on file  Social Connections: Not on file  Intimate Partner Violence: Not on file    Outpatient Medications Prior to Visit  Medication Sig Dispense Refill  . acetaminophen (TYLENOL) 325 MG tablet Take 1-2 tablets (325-650 mg total) by mouth every 4 (four) hours as needed for mild pain.    Marland Kitchen aspirin 325 MG EC tablet Take 1 tablet (325 mg total) by mouth daily. (Patient not taking: Reported on 03/16/2020) 100 tablet 0  . amantadine (SYMMETREL) 100 MG capsule Take 1 capsule (100 mg total) by  mouth daily. (Patient not taking: Reported on 03/16/2020) 30 capsule 2  . amLODipine (NORVASC) 5 MG tablet Take 1 tablet (5 mg total) by mouth daily. (Patient not taking: Reported on 03/16/2020) 90 tablet 0  . atorvastatin (LIPITOR) 80 MG tablet Take 1 tablet (80 mg total) by mouth daily. (Patient not taking: Reported on 03/16/2020) 90 tablet 3  . clopidogrel (PLAVIX) 75 MG tablet Take 1 tablet (75 mg total) by mouth daily. (Patient not taking: Reported on 03/16/2020) 30 tablet 0   No facility-administered medications prior to visit.    No Known Allergies  ROS Review of Systems  Constitutional: Negative.   HENT: Negative.   Eyes: Negative.   Respiratory: Negative.   Cardiovascular: Negative.   Gastrointestinal: Positive for abdominal distention (obese).  Endocrine: Negative.   Genitourinary: Negative.   Musculoskeletal: Positive for arthralgias (generalized joint pain).  Skin: Negative.   Allergic/Immunologic: Negative.   Neurological: Positive for dizziness (occasional ) and headaches (occasional).  Hematological: Negative.   Psychiatric/Behavioral: Negative.       Objective:    Physical Exam Vitals and nursing note reviewed.  Constitutional:      Appearance: Normal appearance.  HENT:     Head: Normocephalic and atraumatic.     Mouth/Throat:     Mouth: Mucous membranes are moist.     Pharynx: Oropharynx is clear.  Cardiovascular:     Rate and Rhythm: Normal rate and regular rhythm.     Pulses: Normal pulses.     Heart sounds: Normal heart sounds.  Pulmonary:     Effort: Pulmonary effort is normal.     Breath sounds: Normal breath sounds.  Abdominal:     General: Bowel sounds are normal.     Palpations: Abdomen is soft.  Musculoskeletal:        General: Normal range of motion.     Cervical back: Normal range of motion and neck supple.  Skin:    General: Skin is warm and dry.  Neurological:     General: No focal deficit present.     Mental Status: He is alert and  oriented to person, place, and time.  Psychiatric:        Mood and Affect: Mood normal.        Behavior: Behavior normal.        Thought Content: Thought content normal.        Judgment: Judgment normal.     BP (!) 184/104   Pulse 85   Ht 5\' 10"  (1.778 m)   Wt 264 lb (119.7 kg)   SpO2 99%   BMI 37.88 kg/m  Wt Readings from Last 3 Encounters:  03/16/20 264 lb (119.7 kg)  08/27/19 267 lb (121.1 kg)  07/31/19 274 lb 12.8 oz (124.6 kg)     Health Maintenance Due  Topic Date Due  . Hepatitis C Screening  Never done  . TETANUS/TDAP  Never done  . COLONOSCOPY (Pts 45-6122yrs Insurance coverage will need to be confirmed)  Never done  . INFLUENZA VACCINE  Never done  . COVID-19 Vaccine (2 - Moderna 3-dose series) 09/16/2019    There are no preventive care reminders to display for this patient.  No results found for: TSH Lab Results  Component Value Date   WBC 5.9 06/17/2019   HGB 15.7 06/17/2019   HCT 46.8 06/17/2019   MCV 93.4 06/17/2019   PLT 320 06/17/2019   Lab Results  Component Value Date   NA 139 06/17/2019   K 3.6 06/17/2019   CO2 30 06/17/2019   GLUCOSE 129 (H) 06/17/2019   BUN 11 06/17/2019   CREATININE 1.26 (H) 06/17/2019   BILITOT 1.4 (H) 06/13/2019   ALKPHOS 47 06/13/2019   AST 26 06/13/2019   ALT 36 06/13/2019   PROT 7.1 06/13/2019   ALBUMIN 3.6 06/13/2019   CALCIUM 9.5 06/17/2019   ANIONGAP 9 06/17/2019   Lab Results  Component Value Date   CHOL 198 06/07/2019   Lab Results  Component Value Date   HDL 37 (L) 06/07/2019   Lab Results  Component Value Date   LDLCALC 148 (H) 06/07/2019   Lab Results  Component Value Date   TRIG 67 06/07/2019   Lab Results  Component Value Date   CHOLHDL 5.4 06/07/2019   Lab Results  Component Value Date   HGBA1C 6.2 (H) 06/07/2019   Assessment & Plan:   1. Hypertensive urgency Blood pressures are elevated today. Clonidine 0. mg given to patient in office and blood pressures remain elevated. We  referred him to ED via ambulance at this time. Patient refused and signed AMA form at discharge. He denies severe headaches, confusion, seizures, double vision, and blurred vision, nausea and vomiting. He will report to ED if he experiences these symptoms. Patient verbalized understanding.   - cloNIDine (CATAPRES) tablet 0.2 mg  2. Hypertension, unspecified type He will begin to take hypertensive daily medication as prescribed. He will continue to take medications as prescribed, to decrease high sodium intake, excessive alcohol intake, increase potassium intake, smoking cessation, and increase physical activity of at least 30 minutes of cardio activity daily. He will continue to follow Heart Healthy or DASH diet. - amLODipine (NORVASC) 5 MG tablet; Take 1 tablet (5 mg total) by mouth daily.  Dispense: 90 tablet; Refill: 3 - atorvastatin (LIPITOR) 80 MG tablet; Take 1 tablet (80 mg total) by mouth daily.  Dispense: 90 tablet; Refill: 3 - clopidogrel (PLAVIX) 75 MG tablet; Take 1 tablet (75 mg total) by mouth daily.  Dispense: 90 tablet; Refill: 3  3. History of ischemic stroke Stable. No signs or symptoms of respiratory distress noted or reported today.   4. Class 2 severe obesity due to excess calories with serious comorbidity and body mass index (BMI) of 37.0 to 37.9 in adult [facility](HCC) Body mass index is 37.88 kg/m. Goal BMI  is <30. Encouraged efforts to reduce weight include engaging in physical activity as tolerated with goal of 150 minutes per week. Improve dietary choices and eat a meal regimen consistent with a Mediterranean or DASH diet. Reduce simple carbohydrates. Do not skip meals and eat healthy snacks throughout the day to avoid over-eating at dinner. Set a goal weight loss that is achievable for you.  5. Weight loss He has a 32 lb weight loss in 9 months.   6. Anxiety Stable.today.   7. Follow up He will follow up on 03/20/2020 for  BP check only.  He will follow up in 1 month for  office visit.   Meds ordered this encounter  Medications  . amLODipine (NORVASC) 5 MG tablet    Sig: Take 1 tablet (5 mg total) by mouth daily.    Dispense:  90 tablet    Refill:  3  . atorvastatin (LIPITOR) 80 MG tablet    Sig: Take 1 tablet (80 mg total) by mouth daily.    Dispense:  90 tablet    Refill:  3  . clopidogrel (PLAVIX) 75 MG tablet    Sig: Take 1 tablet (75 mg total) by mouth daily.    Dispense:  90 tablet    Refill:  3  . cloNIDine (CATAPRES) tablet 0.2 mg    No orders of the defined types were placed in this encounter.   Referral Orders  No referral(s) requested today    Raliegh Ip, MSN, ANE, FNP-BC [place] [facility]/Sickle Cell Center [facility] [address] [place], [place] [address] [phone] fax  Problem List Items Addressed This Visit      Cardiovascular and Mediastinum   Hypertension   Relevant Medications   amLODipine (NORVASC) 5 MG tablet   atorvastatin (LIPITOR) 80 MG tablet   clopidogrel (PLAVIX) 75 MG tablet   Hypertensive urgency - Primary   Relevant Medications   amLODipine (NORVASC) 5 MG tablet   atorvastatin (LIPITOR) 80 MG tablet     Other   Class 3 severe obesity due to excess calories with serious comorbidity and body mass index (BMI) of 40.0 to 44.9 in adult [facility])    Other Visit Diagnoses    History of ischemic stroke       Weight loss       Anxiety       Follow up          Meds ordered this encounter  Medications  . amLODipine (NORVASC) 5 MG tablet    Sig: Take 1 tablet (5 mg total) by mouth daily.    Dispense:  90 tablet    Refill:  3  . atorvastatin (LIPITOR) 80 MG tablet    Sig: Take 1 tablet (80 mg total) by mouth daily.    Dispense:  90 tablet    Refill:  3  . clopidogrel (PLAVIX) 75 MG tablet    Sig: Take 1 tablet (75 mg total) by mouth daily.    Dispense:  90 tablet    Refill:  3  . cloNIDine (CATAPRES) tablet 0.2 mg     Follow-up: No follow-ups on file.    Kallie Locks, FNP

## 2020-03-17 ENCOUNTER — Encounter: Payer: Self-pay | Admitting: Family Medicine

## 2020-03-18 ENCOUNTER — Telehealth: Payer: Self-pay

## 2020-03-18 NOTE — Telephone Encounter (Signed)
Pt asking for BP medication however doesn't have insurance (less money)  [phone]

## 2020-03-20 ENCOUNTER — Other Ambulatory Visit: Payer: Self-pay | Admitting: Pharmacy Technician

## 2020-03-20 ENCOUNTER — Other Ambulatory Visit: Payer: Self-pay

## 2020-03-20 ENCOUNTER — Other Ambulatory Visit: Payer: Self-pay | Admitting: Nurse Practitioner

## 2020-03-20 VITALS — BP 153/92 | HR 79

## 2020-03-20 DIAGNOSIS — Z8673 Personal history of transient ischemic attack (TIA), and cerebral infarction without residual deficits: Secondary | ICD-10-CM

## 2020-03-20 DIAGNOSIS — I10 Essential (primary) hypertension: Secondary | ICD-10-CM

## 2020-03-20 MED ORDER — CLONIDINE HCL 0.1 MG PO TABS
0.2000 mg | ORAL_TABLET | Freq: Once | ORAL | Status: AC
  Administered 2020-03-20: 0.2 mg via ORAL

## 2020-03-20 MED ORDER — AMLODIPINE BESYLATE 5 MG PO TABS: 5.0000 mg | ORAL_TABLET | Freq: Every day | ORAL | 3 refills | Status: DC

## 2020-03-20 MED ORDER — CLOPIDOGREL BISULFATE 75 MG PO TABS: 75.0000 mg | ORAL_TABLET | Freq: Every day | ORAL | 3 refills | Status: DC

## 2020-03-20 MED ORDER — ATORVASTATIN CALCIUM 80 MG PO TABS: 80.0000 mg | ORAL_TABLET | Freq: Every day | ORAL | 3 refills | Status: AC

## 2020-03-20 MED ORDER — AMLODIPINE BESYLATE 10 MG PO TABS: 10.0000 mg | ORAL_TABLET | Freq: Every day | ORAL | 3 refills | Status: DC

## 2020-03-20 MED FILL — CLOPIDOGREL 75 MG TABLET: 75 | 30 days supply | Qty: 30 | Fill #0

## 2020-03-20 MED FILL — AMLODIPINE BESYLATE 10 MG T: 10 | 30 days supply | Qty: 30 | Fill #0

## 2020-03-20 MED FILL — ATORVASTATIN CALCIUM 80 MG: 80 | 30 days supply | Qty: 30 | Fill #0

## 2020-03-20 NOTE — Progress Notes (Signed)
Patient here today for blood pressure check.  Patient was unable to get medication due to loss of insurance, therefor has not had medication.  Per protocol Clonidine 0.2mg  given to patient and patient monitored.  Colaberated with Thad Ranger, NP and [facility]  Patient was given the information and advised to go there and get medication.  Educated patient on risk of stroke with elevated blood pressure and advised if headache blurred vision to proceed to hospital.  Follow up appointment scheduled.

## 2020-03-25 ENCOUNTER — Other Ambulatory Visit: Payer: Self-pay | Admitting: Family Medicine

## 2020-03-25 ENCOUNTER — Encounter: Payer: Self-pay | Admitting: Family Medicine

## 2020-03-25 ENCOUNTER — Ambulatory Visit (INDEPENDENT_AMBULATORY_CARE_PROVIDER_SITE_OTHER): Payer: Self-pay | Admitting: Family Medicine

## 2020-03-25 ENCOUNTER — Other Ambulatory Visit: Payer: Self-pay

## 2020-03-25 VITALS — BP 155/84 | HR 98 | Ht 70.0 in | Wt 264.0 lb

## 2020-03-25 DIAGNOSIS — I10 Essential (primary) hypertension: Secondary | ICD-10-CM

## 2020-03-25 DIAGNOSIS — R634 Abnormal weight loss: Secondary | ICD-10-CM

## 2020-03-25 DIAGNOSIS — Z8673 Personal history of transient ischemic attack (TIA), and cerebral infarction without residual deficits: Secondary | ICD-10-CM

## 2020-03-25 DIAGNOSIS — I160 Hypertensive urgency: Secondary | ICD-10-CM

## 2020-03-25 DIAGNOSIS — Z09 Encounter for follow-up examination after completed treatment for conditions other than malignant neoplasm: Secondary | ICD-10-CM

## 2020-03-25 MED ORDER — LISINOPRIL-HYDROCHLOROTHIAZIDE 10-12.5 MG PO TABS: 1.0000 | ORAL_TABLET | Freq: Every day | ORAL | 3 refills | Status: DC

## 2020-03-25 NOTE — Progress Notes (Signed)
[facility]   Established Patient Office Visit  Subjective:  Patient ID: William Ellison, male    DOB: 11/02/1960  Age: 60 y.o. MRN: [id number]  CC:  Chief Complaint  Patient presents with  . Hypertension    HPI William Ellison is a 60 year old male who presents for Follow Up today.   Patient Active Problem List   Diagnosis Date Noted  . Sleep disturbance   . Morbid obesity (HCC)   . Dyslipidemia   . Ischemic stroke (HCC) L MCA embolic vs large vessle w/ L M1 occlusion    . Benign essential HTN   . Prediabetes   . Hypokalemia   . 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 Hypertensive urgency 06/25/2018  . Hypertension 06/25/2018  . Blood pressure elevated without history of HTN 06/20/2018  . Class 3 severe obesity due to excess calories with serious comorbidity and body mass index (BMI) of 40.0 to 44.9 in adult (HCC) 06/20/2018  . Erectile dysfunction 06/20/2018  . Skin rash 06/20/2018   Current Status: Since his last office visit, he is doing well with no complaints. He previously has been without his hypertensive medications since 11/2020. At his last office visit on 03/16/2020 we restarted blood pressure medications and he states that he is taking as prescribed. He denies visual changes, chest pain, cough, shortness of breath, heart palpitations, and falls. He has occasional headaches and dizziness with position changes. Denies severe headaches, confusion, seizures, double vision, and blurred vision, nausea and vomiting. He denies fevers, chills, fatigue, recent infections, weight loss, and night sweats. Denies GI problems such as diarrhea, and constipation. He has no reports of blood in stools, dysuria and hematuria. No depression or anxiety reported today. He is taking all medications as prescribed. He denies pain today.   Past Medical History:  Diagnosis Date  . Acute ischemic stroke (HCC) 05/2019  . Anxiety   . Erectile  dysfunction   . Hypertension   . Skin rash     Past Surgical History:  Procedure Laterality Date  . BUBBLE STUDY  06/12/2019   Procedure: BUBBLE STUDY;  Surgeon: Chilton Si, MD;  Location: [facility] ENDOSCOPY;  Service: Cardiovascular;;  . LOOP RECORDER INSERTION N/A 06/12/2019   Procedure: LOOP RECORDER INSERTION;  Surgeon: Regan Lemming, MD;  Location: [facility] INVASIVE CV LAB;  Service: Cardiovascular;  Laterality: N/A;  . TEE WITHOUT CARDIOVERSION N/A 06/12/2019   Procedure: TRANSESOPHAGEAL ECHOCARDIOGRAM (TEE);  Surgeon: Chilton Si, MD;  Location: [facility] ENDOSCOPY;  Service: Cardiovascular;  Laterality: N/A;    Family History  Problem Relation Age of Onset  . Hypertension Mother     Social History   Socioeconomic History  . Marital status: Significant Other    Spouse name: Not on file  . Number of children: Not on file  . Years of education: Not on file  . Highest education level: Not on file  Occupational History  . Not on file  Tobacco Use  . Smoking status: Never Smoker  . Smokeless tobacco: Never Used  Vaping Use  . Vaping Use: Never used  Substance and Sexual Activity  . Alcohol use: Yes  . Drug use: Never  . Sexual activity: Yes  Other Topics Concern  . Not on file  Social History Narrative  . Not on file   Social Determinants of Health   Financial Resource Strain: Not on file  Food Insecurity: Not on file  Transportation Needs: Not on file  Physical  Activity: Not on file  Stress: Not on file  Social Connections: Not on file  Intimate Partner Violence: Not on file    Outpatient Medications Prior to Visit  Medication Sig Dispense Refill  . acetaminophen (TYLENOL) 325 MG tablet Take 1-2 tablets (325-650 mg total) by mouth every 4 (four) hours as needed for mild pain.    Marland Kitchen amLODipine (NORVASC) 10 MG tablet Take 1 tablet (10 mg total) by mouth daily. 90 tablet 3  . atorvastatin (LIPITOR) 80 MG tablet Take 1 tablet (80 mg total) by mouth daily. 90 tablet  3  . clopidogrel (PLAVIX) 75 MG tablet Take 1 tablet (75 mg total) by mouth daily. 90 tablet 3  . aspirin 325 MG EC tablet Take 1 tablet (325 mg total) by mouth daily. (Patient not taking: Reported on 03/16/2020) 100 tablet 0   No facility-administered medications prior to visit.    No Known Allergies  ROS Review of Systems  Constitutional: Negative.   HENT: Negative.   Eyes: Negative.   Respiratory: Negative.   Cardiovascular: Negative.   Gastrointestinal: Negative.   Endocrine: Negative.   Genitourinary: Negative.   Musculoskeletal: Negative.   Skin: Negative.   Allergic/Immunologic: Negative.   Neurological: Positive for dizziness (occasional ) and headaches (occasional ).  Hematological: Negative.   Psychiatric/Behavioral: Negative.       Objective:    Physical Exam Vitals and nursing note reviewed.  Constitutional:      Appearance: Normal appearance.  HENT:     Head: Normocephalic and atraumatic.     Nose: Nose normal.     Mouth/Throat:     Mouth: Mucous membranes are moist.     Pharynx: Oropharynx is clear.  Cardiovascular:     Rate and Rhythm: Normal rate and regular rhythm.     Pulses: Normal pulses.     Heart sounds: Normal heart sounds.  Pulmonary:     Effort: Pulmonary effort is normal.     Breath sounds: Normal breath sounds.  Abdominal:     General: Bowel sounds are normal.     Palpations: Abdomen is soft.  Musculoskeletal:        General: Normal range of motion.     Cervical back: Normal range of motion and neck supple.  Skin:    General: Skin is warm and dry.  Neurological:     General: No focal deficit present.     Mental Status: He is alert and oriented to person, place, and time.  Psychiatric:        Mood and Affect: Mood normal.        Behavior: Behavior normal.        Thought Content: Thought content normal.        Judgment: Judgment normal.    BP (!) 155/84   Pulse 98   Ht 5\' 10"  (1.778 m)   Wt 264 lb (119.7 kg)   SpO2 100%    BMI 37.88 kg/m  Wt Readings from Last 3 Encounters:  03/25/20 264 lb (119.7 kg)  03/16/20 264 lb (119.7 kg)  08/27/19 267 lb (121.1 kg)     Health Maintenance Due  Topic Date Due  . Hepatitis C Screening  Never done  . TETANUS/TDAP  Never done  . COLONOSCOPY (Pts 45-69yrs Insurance coverage will need to be confirmed)  Never done  . INFLUENZA VACCINE  Never done  . COVID-19 Vaccine (2 - Moderna 3-dose series) 09/16/2019    There are no preventive care reminders to display for this  patient.  No results found for: TSH Lab Results  Component Value Date   WBC 5.9 06/17/2019   HGB 15.7 06/17/2019   HCT 46.8 06/17/2019   MCV 93.4 06/17/2019   PLT 320 06/17/2019   Lab Results  Component Value Date   NA 139 06/17/2019   K 3.6 06/17/2019   CO2 30 06/17/2019   GLUCOSE 129 (H) 06/17/2019   BUN 11 06/17/2019   CREATININE 1.26 (H) 06/17/2019   BILITOT 1.4 (H) 06/13/2019   ALKPHOS 47 06/13/2019   AST 26 06/13/2019   ALT 36 06/13/2019   PROT 7.1 06/13/2019   ALBUMIN 3.6 06/13/2019   CALCIUM 9.5 06/17/2019   ANIONGAP 9 06/17/2019   Lab Results  Component Value Date   CHOL 198 06/07/2019   Lab Results  Component Value Date   HDL 37 (L) 06/07/2019   Lab Results  Component Value Date   LDLCALC 148 (H) 06/07/2019   Lab Results  Component Value Date   TRIG 67 06/07/2019   Lab Results  Component Value Date   CHOLHDL 5.4 06/07/2019   Lab Results  Component Value Date   HGBA1C 6.2 (H) 06/07/2019    Assessment & Plan:   1. Hypertension, unspecified type The current medical regimen is effective; blood pressure is stable today; continue present plan and medications as prescribed. She will continue to take medications as prescribed, to decrease high sodium intake, excessive alcohol intake, increase potassium intake, smoking cessation, and increase physical activity of at least 30 minutes of cardio activity daily. She will continue to follow Heart Healthy or DASH diet. -  lisinopril-hydrochlorothiazide (ZESTORETIC) 10-12.5 MG tablet; Take 1 tablet by mouth daily.  Dispense: 30 tablet; Refill: 3  2. Hypertensive urgency  3. History of ischemic stroke Stable. No signs or symptoms of respiratory distress noted or reported today.  4. Weight loss  5. Follow up He will follow up in 3 months for assessment of effectiveness of new hypertensive medications and possible letter to return back to work.  Meds ordered this encounter  Medications  . lisinopril-hydrochlorothiazide (ZESTORETIC) 10-12.5 MG tablet    Sig: Take 1 tablet by mouth daily.    Dispense:  30 tablet    Refill:  3    No orders of the defined types were placed in this encounter.   Referral Orders  No referral(s) requested today    Raliegh Ip, MSN, ANE, FNP-BC [facility] [address] [place], [place] [address] [phone]- fax   Problem List Items Addressed This Visit      Cardiovascular and Mediastinum   Hypertension - Primary   Relevant Medications   lisinopril-hydrochlorothiazide (ZESTORETIC) 10-12.5 MG tablet   Hypertensive urgency   Relevant Medications   lisinopril-hydrochlorothiazide (ZESTORETIC) 10-12.5 MG tablet    Other Visit Diagnoses    History of ischemic stroke       Weight loss       Follow up          Meds ordered this encounter  Medications  . lisinopril-hydrochlorothiazide (ZESTORETIC) 10-12.5 MG tablet    Sig: Take 1 tablet by mouth daily.    Dispense:  30 tablet    Refill:  3    Follow-up: No follow-ups on file.    Kallie Locks, FNP

## 2020-03-25 NOTE — Patient Instructions (Signed)
Lisinopril; Hydrochlorothiazide, HCTZ Oral Tablets What is this medicine? LISINOPRIL; HYDROCHLOROTHIAZIDE (lyse IN oh pril; hye droe klor oh THYE a zide) is a combination of an ACE inhibitor and diuretic. It treats high blood pressure. This medicine may be used for other purposes; ask your health care provider or pharmacist if you have questions. COMMON BRAND NAME(S): Prinzide, Zestoretic What should I tell my health care provider before I take this medicine? They need to know if you have any of these conditions:  bone marrow disease  decreased urine  diabetes  heart or blood vessel disease  if you are on a special diet like a low salt diet  immune system problems, like lupus  kidney disease  liver disease  previous swelling of the tongue, face, or lips with difficulty breathing, difficulty swallowing, hoarseness, or tightening of the throat  recent heart attack or stroke  an unusual or allergic reaction to lisinopril, hydrochlorothiazide, sulfa drugs, other medicines, insect venom, foods, dyes, or preservatives  pregnant or trying to get pregnant  breast-feeding How should I use this medicine? Take this drug by mouth. Take it as directed on the prescription label at the same time every day. You can take it with or without food. If it upsets your stomach, take it with food. Keep taking it unless your health care provider tells you to stop. Talk to your health care provider about the use of this drug in children. Special care may be needed. Overdosage: If you think you have taken too much of this medicine contact a poison control center or emergency room at once. NOTE: This medicine is only for you. Do not share this medicine with others. What if I miss a dose? If you miss a dose, take it as soon as you can. If it is almost time for your next dose, take only that dose. Do not take double or extra doses. What may interact with this medicine? Do not take this medication with any  of the following medications:  sacubitril; valsartan This medicine may also interact with the following:  barbiturates like phenobarbital  blood pressure medicines  corticosteroids like prednisone  diabetic medications  diuretics, especially triamterene, spironolactone or amiloride  lithium  NSAIDs, medicines for pain and inflammation, like ibuprofen or naproxen  potassium salts or potassium supplements  prescription pain medicines  skeletal muscle relaxants like tubocurarine  some cholesterol lowering medications like cholestyramine or colestipol This list may not describe all possible interactions. Give your health care provider a list of all the medicines, herbs, non-prescription drugs, or dietary supplements you use. Also tell them if you smoke, drink alcohol, or use illegal drugs. Some items may interact with your medicine. What should I watch for while using this medicine? Visit your doctor or health care professional for regular checks on your progress. Check your blood pressure as directed. Ask your doctor or health care professional what your blood pressure should be and when you should contact him or her. Call your doctor or health care professional if you notice an irregular or fast heartbeat. You must not get dehydrated. Ask your doctor or health care professional how much fluid you need to drink a day. Check with him or her if you get an attack of severe diarrhea, nausea and vomiting, or if you sweat a lot. The loss of too much body fluid can make it dangerous for you to take this medicine. Women should inform their doctor if they wish to become pregnant or think they might be  pregnant. There is a potential for serious side effects to an unborn child. Talk to your health care professional or pharmacist for more information. You may get drowsy or dizzy. Do not drive, use machinery, or do anything that needs mental alertness until you know how this drug affects you. Do not  stand or sit up quickly, especially if you are an older patient. This reduces the risk of dizzy or fainting spells. Alcohol can make you more drowsy and dizzy. Avoid alcoholic drinks. This medicine may increase blood sugar. Ask your healthcare provider if changes in diet or medicines are needed if you have diabetes. Avoid salt substitutes unless you are told otherwise by your doctor or health care professional. Talk to your health care professional about your risk of skin cancer. You may be more at risk for skin cancer if you take this medicine. This medicine can make you more sensitive to the sun. Keep out of the sun. If you cannot avoid being in the sun, wear protective clothing and use sunscreen. Do not use sun lamps or tanning beds/booths. Do not treat yourself for coughs, colds, or pain while you are taking this medicine without asking your doctor or health care professional for advice. Some ingredients may increase your blood pressure. What side effects may I notice from receiving this medicine? Side effects that you should report to your doctor or health care professional as soon as possible:  changes in vision  confusion, dizziness, light headedness or fainting spells  decreased amount of urine passed  difficulty breathing or swallowing, hoarseness, or tightening of the throat  eye pain  fast or irregular heart beat, palpitations, or chest pain  muscle cramps  nausea and vomiting  persistent dry cough  redness, blistering, peeling or loosening of the skin, including inside the mouth  signs and symptoms of high blood sugar such as being more thirsty or hungry or having to urinate more than normal. You may also feel very tired or have blurry vision.  stomach pain  swelling of your face, lips, tongue, hands, or feet  unusual rash, bleeding or bruising, or pinpoint red spots on the skin  worsened gout pain  yellowing of the eyes or skin Side effects that usually do not  require medical attention (report to your doctor or health care professional if they continue or are bothersome):  change in sex drive or performance  cough  headache This list may not describe all possible side effects. Call your doctor for medical advice about side effects. You may report side effects to FDA at 1-800-FDA-1088. Where should I keep my medicine? Keep out of the reach of children and pets. Store at room temperature between 20 and 25 degrees C (68 and 77 degrees F). Protect from light and moisture. Keep the container tightly closed. Throw away any unused drug after the expiration date. NOTE: This sheet is a summary. It may not cover all possible information. If you have questions about this medicine, talk to your doctor, pharmacist, or health care provider.  2021 [employer]vier/Gold Standard (2018-11-14 22:59:49)

## 2020-04-03 ENCOUNTER — Ambulatory Visit (INDEPENDENT_AMBULATORY_CARE_PROVIDER_SITE_OTHER): Payer: Self-pay

## 2020-04-03 DIAGNOSIS — I639 Cerebral infarction, unspecified: Secondary | ICD-10-CM

## 2020-04-05 LAB — CUP PACEART REMOTE DEVICE CHECK
Date Time Interrogation Session: 20220327025614
Implantable Pulse Generator Implant Date: 20210602

## 2020-04-14 NOTE — Progress Notes (Signed)
Carelink Summary Report / Loop Recorder 

## 2020-04-17 ENCOUNTER — Other Ambulatory Visit: Payer: Self-pay

## 2020-04-17 ENCOUNTER — Other Ambulatory Visit: Payer: Self-pay | Admitting: Family Medicine

## 2020-04-17 ENCOUNTER — Other Ambulatory Visit: Payer: Self-pay | Admitting: Nurse Practitioner

## 2020-04-17 ENCOUNTER — Ambulatory Visit (INDEPENDENT_AMBULATORY_CARE_PROVIDER_SITE_OTHER): Payer: Self-pay | Admitting: Family Medicine

## 2020-04-17 ENCOUNTER — Encounter: Payer: Self-pay | Admitting: Family Medicine

## 2020-04-17 VITALS — BP 132/74 | HR 78 | Temp 97.5°F | Ht 70.0 in | Wt 242.0 lb

## 2020-04-17 DIAGNOSIS — R7303 Prediabetes: Secondary | ICD-10-CM

## 2020-04-17 DIAGNOSIS — R531 Weakness: Secondary | ICD-10-CM

## 2020-04-17 DIAGNOSIS — I10 Essential (primary) hypertension: Secondary | ICD-10-CM

## 2020-04-17 DIAGNOSIS — F419 Anxiety disorder, unspecified: Secondary | ICD-10-CM

## 2020-04-17 DIAGNOSIS — Z09 Encounter for follow-up examination after completed treatment for conditions other than malignant neoplasm: Secondary | ICD-10-CM

## 2020-04-17 MED ORDER — ATORVASTATIN CALCIUM 80 MG PO TABS
1.0000 | ORAL_TABLET | Freq: Every day | ORAL | 3 refills | Status: DC
Start: 2020-04-17 — End: 2020-06-05
  Filled 2020-04-17: qty 30, 30d supply, fill #0

## 2020-04-17 MED ORDER — LISINOPRIL-HYDROCHLOROTHIAZIDE 10-12.5 MG PO TABS
1.0000 | ORAL_TABLET | Freq: Every day | ORAL | 3 refills | Status: DC
  Filled 2020-04-17: qty 30, 30d supply, fill #0

## 2020-04-17 MED ORDER — CLOPIDOGREL BISULFATE 75 MG PO TABS
1.0000 | ORAL_TABLET | Freq: Every day | ORAL | 3 refills | Status: DC
Start: 2020-04-17 — End: 2020-06-05
  Filled 2020-04-17: qty 30, 30d supply, fill #0

## 2020-04-17 NOTE — Progress Notes (Signed)
[facility]   Established Patient Office Visit  Subjective:  Patient ID: William Ellison, male    DOB: February 29, 1960  Age: 60 y.o. MRN: [id number]  CC:  Chief Complaint  Patient presents with  . Follow-up    Follow up , htn , and refill on medication     HPI William Ellison is a 60 year old male who presents for Follow Up today.   Patient Active Problem List   Diagnosis Date Noted  . Sleep disturbance   . Morbid obesity (HCC)   . Dyslipidemia   . Ischemic stroke (HCC) L MCA embolic vs large vessle w/ L M1 occlusion    . Benign essential HTN   . Prediabetes   . Hypokalemia   . 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 Hypertensive urgency 06/25/2018  . Hypertension 06/25/2018  . Blood pressure elevated without history of HTN 06/20/2018  . Class 3 severe obesity due to excess calories with serious comorbidity and body mass index (BMI) of 40.0 to 44.9 in adult (HCC) 06/20/2018  . Erectile dysfunction 06/20/2018  . Skin rash 06/20/2018   Current Status: Since his last office visit, he is doing well with no complaints. H denies visual changes, chest pain, cough, shortness of breath, heart palpitations, and falls. H has occasional headaches and dizziness with position changes. Denies severe headaches, confusion, seizures, double vision, and blurred vision, nausea and vomiting. He denies fevers, chills, fatigue, recent infections, weight loss, and night sweats. Denies GI problems such as diarrhea, and constipation. He has no reports of blood in stools, dysuria and hematuria. No depression or anxiety, and denies suicidal ideations, homicidal ideations, or auditory hallucinations. He is taking all medications as prescribed. He denies pain today.   Past Medical History:  Diagnosis Date  . Acute ischemic stroke (HCC) 05/2019  . Anxiety   . Erectile dysfunction   . Hypertension   . Skin rash     Past Surgical History:  Procedure  Laterality Date  . BUBBLE STUDY  06/12/2019   Procedure: BUBBLE STUDY;  Surgeon: Chilton Si, MD;  Location: [facility] ENDOSCOPY;  Service: Cardiovascular;;  . LOOP RECORDER INSERTION N/A 06/12/2019   Procedure: LOOP RECORDER INSERTION;  Surgeon: Regan Lemming, MD;  Location: [facility] INVASIVE CV LAB;  Service: Cardiovascular;  Laterality: N/A;  . TEE WITHOUT CARDIOVERSION N/A 06/12/2019   Procedure: TRANSESOPHAGEAL ECHOCARDIOGRAM (TEE);  Surgeon: Chilton Si, MD;  Location: [facility] ENDOSCOPY;  Service: Cardiovascular;  Laterality: N/A;    Family History  Problem Relation Age of Onset  . Hypertension Mother     Social History   Socioeconomic History  . Marital status: Significant Other    Spouse name: Not on file  . Number of children: Not on file  . Years of education: Not on file  . Highest education level: Not on file  Occupational History  . Not on file  Tobacco Use  . Smoking status: Never Smoker  . Smokeless tobacco: Never Used  Vaping Use  . Vaping Use: Never used  Substance and Sexual Activity  . Alcohol use: Not Currently  . Drug use: Never  . Sexual activity: Yes  Other Topics Concern  . Not on file  Social History Narrative  . Not on file   Social Determinants of Health   Financial Resource Strain: Not on file  Food Insecurity: Not on file  Transportation Needs: Not on file  Physical Activity: Not on file  Stress: Not on file  Social Connections: Not on file  Intimate Partner Violence: Not on file    Outpatient Medications Prior to Visit  Medication Sig Dispense Refill  . acetaminophen (TYLENOL) 325 MG tablet Take 1-2 tablets (325-650 mg total) by mouth every 4 (four) hours as needed for mild pain.    Marland Kitchen amLODipine (NORVASC) 10 MG tablet Take 1 tablet (10 mg total) by mouth daily. 90 tablet 3  . atorvastatin (LIPITOR) 80 MG tablet Take 1 tablet (80 mg total) by mouth daily. 90 tablet 3  . clopidogrel (PLAVIX) 75 MG tablet Take 1 tablet (75 mg total) by mouth  daily. 90 tablet 3  . lisinopril-hydrochlorothiazide (ZESTORETIC) 10-12.5 MG tablet Take 1 tablet by mouth daily. 30 tablet 3  . amLODipine (NORVASC) 10 MG tablet TAKE 1 TABLET (10 MG TOTAL) BY MOUTH DAILY. 90 tablet 3  . atorvastatin (LIPITOR) 80 MG tablet TAKE 1 TABLET (80 MG TOTAL) BY MOUTH DAILY. 90 tablet 3  . clopidogrel (PLAVIX) 75 MG tablet TAKE 1 TABLET (75 MG TOTAL) BY MOUTH DAILY. 90 tablet 3  . lisinopril-hydrochlorothiazide (ZESTORETIC) 10-12.5 MG tablet TAKE 1 TABLET BY MOUTH DAILY. 30 tablet 3   No facility-administered medications prior to visit.    No Known Allergies  ROS Review of Systems  Constitutional: Negative.   HENT: Negative.   Eyes: Negative.   Respiratory: Negative.   Cardiovascular: Negative.   Gastrointestinal: Negative.   Endocrine: Negative.   Genitourinary: Negative.   Musculoskeletal: Negative.   Skin: Negative.   Allergic/Immunologic: Negative.   Neurological: Positive for dizziness (occasional ) and headaches (occasional ).  Hematological: Negative.   Psychiatric/Behavioral: Negative.    Objective:    Physical Exam Vitals and nursing note reviewed.  Constitutional:      Appearance: Normal appearance.  HENT:     Head: Normocephalic and atraumatic.     Nose: Nose normal.     Mouth/Throat:     Mouth: Mucous membranes are moist.     Pharynx: Oropharynx is clear.  Cardiovascular:     Rate and Rhythm: Normal rate and regular rhythm.     Pulses: Normal pulses.     Heart sounds: Normal heart sounds.  Pulmonary:     Effort: Pulmonary effort is normal.     Breath sounds: Normal breath sounds.  Abdominal:     General: Bowel sounds are normal.     Palpations: Abdomen is soft.  Musculoskeletal:        General: Normal range of motion.     Cervical back: Normal range of motion and neck supple.  Skin:    General: Skin is warm and dry.  Neurological:     General: No focal deficit present.     Mental Status: He is alert and oriented to  person, place, and time.  Psychiatric:        Mood and Affect: Mood normal.        Behavior: Behavior normal.        Thought Content: Thought content normal.        Judgment: Judgment normal.    BP 132/74 (BP Location: Left Arm, Patient Position: Sitting, Cuff Size: Normal)   Pulse 78   Temp (!) 97.5 F (36.4 C) (Temporal)   Ht 5\' 10"  (1.778 m)   Wt 242 lb (109.8 kg)   SpO2 98%   BMI 34.72 kg/m  Wt Readings from Last 3 Encounters:  04/17/20 242 lb (109.8 kg)  03/25/20 264 lb (119.7 kg)  03/16/20 264 lb (119.7 kg)  Health Maintenance Due  Topic Date Due  . Hepatitis C Screening  Never done  . TETANUS/TDAP  Never done  . COLONOSCOPY (Pts 45-67yrs Insurance coverage will need to be confirmed)  Never done  . COVID-19 Vaccine (2 - Moderna 3-dose series) 09/16/2019    There are no preventive care reminders to display for this patient.  No results found for: TSH Lab Results  Component Value Date   WBC 5.9 06/17/2019   HGB 15.7 06/17/2019   HCT 46.8 06/17/2019   MCV 93.4 06/17/2019   PLT 320 06/17/2019   Lab Results  Component Value Date   NA 139 06/17/2019   K 3.6 06/17/2019   CO2 30 06/17/2019   GLUCOSE 129 (H) 06/17/2019   BUN 11 06/17/2019   CREATININE 1.26 (H) 06/17/2019   BILITOT 1.4 (H) 06/13/2019   ALKPHOS 47 06/13/2019   AST 26 06/13/2019   ALT 36 06/13/2019   PROT 7.1 06/13/2019   ALBUMIN 3.6 06/13/2019   CALCIUM 9.5 06/17/2019   ANIONGAP 9 06/17/2019   Lab Results  Component Value Date   CHOL 198 06/07/2019   Lab Results  Component Value Date   HDL 37 (L) 06/07/2019   Lab Results  Component Value Date   LDLCALC 148 (H) 06/07/2019   Lab Results  Component Value Date   TRIG 67 06/07/2019   Lab Results  Component Value Date   CHOLHDL 5.4 06/07/2019   Lab Results  Component Value Date   HGBA1C 6.2 (H) 06/07/2019   Assessment & Plan:   1. Hypertension, unspecified type The current medical regimen is effective; blood pressure is  stable at 132/74 today; continue present plan and medications as prescribed. He will continue to take medications as prescribed, to decrease high sodium intake, excessive alcohol intake, increase potassium intake, smoking cessation, and increase physical activity of at least 30 minutes of cardio activity daily. He will continue to follow Heart Healthy or DASH diet.  2. Right sided weakness Stable today.   3. Prediabetes Hgb A1c is stable at 6.2 today. He will continue medication as prescribed, to decrease foods/beverages high in sugars and carbs and follow Heart Healthy or DASH diet. Increase physical activity to at least 30 minutes cardio exercise daily.  4. Anxiety Stable.   5. Follow up He will follow up in 6 months for Annual Physical and Labwork.   No orders of the defined types were placed in this encounter.   No orders of the defined types were placed in this encounter.   Referral Orders  No referral(s) requested today    Raliegh Ip, MSN, ANE, FNP-BC [facility] [address] [place], [place] [address] [phone]   Problem List Items Addressed This Visit      Cardiovascular and Mediastinum   Hypertension - Primary     Other   Prediabetes    Other Visit Diagnoses    Right sided weakness       Anxiety       Follow up          No orders of the defined types were placed in this encounter.   Follow-up: No follow-ups on file.    Kallie Locks, FNP

## 2020-04-20 ENCOUNTER — Other Ambulatory Visit: Payer: Self-pay

## 2020-04-21 ENCOUNTER — Encounter: Payer: Self-pay | Admitting: Family Medicine

## 2020-05-05 ENCOUNTER — Ambulatory Visit (INDEPENDENT_AMBULATORY_CARE_PROVIDER_SITE_OTHER): Payer: Self-pay

## 2020-05-05 DIAGNOSIS — I639 Cerebral infarction, unspecified: Secondary | ICD-10-CM

## 2020-05-05 LAB — CUP PACEART REMOTE DEVICE CHECK
Date Time Interrogation Session: 20220425232821
Implantable Pulse Generator Implant Date: 20210602

## 2020-05-25 NOTE — Progress Notes (Signed)
Carelink Summary Report / Loop Recorder 

## 2020-06-05 ENCOUNTER — Other Ambulatory Visit: Payer: Self-pay | Admitting: Internal Medicine

## 2020-06-05 ENCOUNTER — Telehealth: Payer: Self-pay

## 2020-06-05 ENCOUNTER — Other Ambulatory Visit: Payer: Self-pay

## 2020-06-05 DIAGNOSIS — I10 Essential (primary) hypertension: Secondary | ICD-10-CM

## 2020-06-05 MED ORDER — LISINOPRIL-HYDROCHLOROTHIAZIDE 10-12.5 MG PO TABS
1.0000 | ORAL_TABLET | Freq: Every day | ORAL | 3 refills | Status: DC
  Filled 2020-06-05: qty 30, 30d supply, fill #0
  Filled 2020-08-14: qty 30, 30d supply, fill #1
  Filled 2020-09-23: qty 30, 30d supply, fill #2
  Filled 2020-11-09: qty 30, 30d supply, fill #3
  Filled 2021-01-05: qty 30, 30d supply, fill #4

## 2020-06-05 MED ORDER — AMLODIPINE BESYLATE 10 MG PO TABS
10.0000 mg | ORAL_TABLET | Freq: Every day | ORAL | 3 refills | Status: DC
  Filled 2020-06-05: qty 30, 30d supply, fill #0
  Filled 2020-08-14: qty 30, 30d supply, fill #1
  Filled 2020-09-23: qty 30, 30d supply, fill #2
  Filled 2020-11-09: qty 30, 30d supply, fill #3
  Filled 2021-01-05: qty 30, 30d supply, fill #4

## 2020-06-05 MED ORDER — CLOPIDOGREL BISULFATE 75 MG PO TABS
75.0000 mg | ORAL_TABLET | Freq: Every day | ORAL | 3 refills | Status: DC
  Filled 2020-06-05: qty 30, 30d supply, fill #0
  Filled 2020-08-14: qty 30, 30d supply, fill #1
  Filled 2020-09-23: qty 30, 30d supply, fill #2
  Filled 2020-11-09: qty 30, 30d supply, fill #3
  Filled 2021-01-05: qty 30, 30d supply, fill #4

## 2020-06-05 NOTE — Telephone Encounter (Signed)
Med refill  BP medication

## 2020-06-08 LAB — CUP PACEART REMOTE DEVICE CHECK
Date Time Interrogation Session: 20220528232602
Implantable Pulse Generator Implant Date: 20210602

## 2020-06-09 ENCOUNTER — Ambulatory Visit (INDEPENDENT_AMBULATORY_CARE_PROVIDER_SITE_OTHER): Payer: Self-pay

## 2020-06-09 ENCOUNTER — Other Ambulatory Visit: Payer: Self-pay

## 2020-06-09 DIAGNOSIS — I63412 Cerebral infarction due to embolism of left middle cerebral artery: Secondary | ICD-10-CM

## 2020-07-01 NOTE — Progress Notes (Signed)
Carelink Summary Report / Loop Recorder 

## 2020-07-10 ENCOUNTER — Ambulatory Visit (INDEPENDENT_AMBULATORY_CARE_PROVIDER_SITE_OTHER): Payer: Self-pay

## 2020-07-10 DIAGNOSIS — I63412 Cerebral infarction due to embolism of left middle cerebral artery: Secondary | ICD-10-CM

## 2020-07-10 LAB — CUP PACEART REMOTE DEVICE CHECK
Date Time Interrogation Session: 20220630233311
Implantable Pulse Generator Implant Date: 20210602

## 2020-07-29 NOTE — Progress Notes (Signed)
Carelink Summary Report / Loop Recorder 

## 2020-08-11 ENCOUNTER — Ambulatory Visit (INDEPENDENT_AMBULATORY_CARE_PROVIDER_SITE_OTHER): Payer: Self-pay

## 2020-08-11 DIAGNOSIS — I63412 Cerebral infarction due to embolism of left middle cerebral artery: Secondary | ICD-10-CM

## 2020-08-12 LAB — CUP PACEART REMOTE DEVICE CHECK
Date Time Interrogation Session: 20220802233357
Implantable Pulse Generator Implant Date: 20210602

## 2020-08-14 ENCOUNTER — Other Ambulatory Visit: Payer: Self-pay

## 2020-09-04 NOTE — Progress Notes (Signed)
Carelink Summary Report / Loop Recorder 

## 2020-09-15 ENCOUNTER — Ambulatory Visit (INDEPENDENT_AMBULATORY_CARE_PROVIDER_SITE_OTHER): Payer: Self-pay

## 2020-09-15 DIAGNOSIS — I63412 Cerebral infarction due to embolism of left middle cerebral artery: Secondary | ICD-10-CM

## 2020-09-15 LAB — CUP PACEART REMOTE DEVICE CHECK
Date Time Interrogation Session: 20220904233943
Implantable Pulse Generator Implant Date: 20210602

## 2020-09-23 ENCOUNTER — Other Ambulatory Visit: Payer: Self-pay

## 2020-09-23 NOTE — Progress Notes (Signed)
Carelink Summary Report / Loop Recorder 

## 2020-10-16 ENCOUNTER — Ambulatory Visit (INDEPENDENT_AMBULATORY_CARE_PROVIDER_SITE_OTHER): Payer: Self-pay

## 2020-10-16 DIAGNOSIS — I63412 Cerebral infarction due to embolism of left middle cerebral artery: Secondary | ICD-10-CM

## 2020-10-19 ENCOUNTER — Ambulatory Visit: Payer: Self-pay | Admitting: Nurse Practitioner

## 2020-10-19 LAB — CUP PACEART REMOTE DEVICE CHECK
Date Time Interrogation Session: 20221007233753
Implantable Pulse Generator Implant Date: 20210602

## 2020-10-26 NOTE — Progress Notes (Signed)
Carelink Summary Report / Loop Recorder 

## 2020-11-09 ENCOUNTER — Other Ambulatory Visit: Payer: Self-pay

## 2020-11-11 ENCOUNTER — Other Ambulatory Visit: Payer: Self-pay

## 2020-11-17 ENCOUNTER — Ambulatory Visit (INDEPENDENT_AMBULATORY_CARE_PROVIDER_SITE_OTHER): Payer: Self-pay

## 2020-11-17 DIAGNOSIS — I63412 Cerebral infarction due to embolism of left middle cerebral artery: Secondary | ICD-10-CM

## 2020-11-20 LAB — CUP PACEART REMOTE DEVICE CHECK
Date Time Interrogation Session: 20221109223949
Implantable Pulse Generator Implant Date: 20210602

## 2020-11-24 NOTE — Progress Notes (Signed)
Carelink Summary Report / Loop Recorder 

## 2020-12-21 ENCOUNTER — Ambulatory Visit (INDEPENDENT_AMBULATORY_CARE_PROVIDER_SITE_OTHER): Payer: Self-pay

## 2020-12-21 DIAGNOSIS — I63412 Cerebral infarction due to embolism of left middle cerebral artery: Secondary | ICD-10-CM

## 2020-12-22 LAB — CUP PACEART REMOTE DEVICE CHECK
Date Time Interrogation Session: 20221212223957
Implantable Pulse Generator Implant Date: 20210602

## 2020-12-28 NOTE — Progress Notes (Signed)
Carelink Summary Report / Loop Recorder 

## 2021-01-05 ENCOUNTER — Other Ambulatory Visit: Payer: Self-pay

## 2021-01-19 ENCOUNTER — Other Ambulatory Visit: Payer: Self-pay

## 2021-01-19 DIAGNOSIS — Y92410 Unspecified street and highway as the place of occurrence of the external cause: Secondary | ICD-10-CM | POA: Diagnosis not present

## 2021-01-19 DIAGNOSIS — Z7902 Long term (current) use of antithrombotics/antiplatelets: Secondary | ICD-10-CM | POA: Insufficient documentation

## 2021-01-19 DIAGNOSIS — M542 Cervicalgia: Secondary | ICD-10-CM | POA: Insufficient documentation

## 2021-01-19 DIAGNOSIS — S20212A Contusion of left front wall of thorax, initial encounter: Secondary | ICD-10-CM | POA: Insufficient documentation

## 2021-01-19 DIAGNOSIS — S161XXA Strain of muscle, fascia and tendon at neck level, initial encounter: Secondary | ICD-10-CM | POA: Insufficient documentation

## 2021-01-19 DIAGNOSIS — S169XXA Unspecified injury of muscle, fascia and tendon at neck level, initial encounter: Secondary | ICD-10-CM | POA: Diagnosis not present

## 2021-01-19 DIAGNOSIS — I10 Essential (primary) hypertension: Secondary | ICD-10-CM | POA: Insufficient documentation
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
## 2021-01-20 ENCOUNTER — Emergency Department (HOSPITAL_BASED_OUTPATIENT_CLINIC_OR_DEPARTMENT_OTHER): Payer: BC Managed Care – PPO

## 2021-01-20 ENCOUNTER — Emergency Department (HOSPITAL_BASED_OUTPATIENT_CLINIC_OR_DEPARTMENT_OTHER)
Admission: EM | Admit: 2021-01-20 | Discharge: 2021-01-20 | Disposition: A | Discharge status: Home / Self Care | Payer: BC Managed Care – PPO | Attending: Emergency Medicine | Admitting: Emergency Medicine

## 2021-01-20 ENCOUNTER — Encounter (HOSPITAL_BASED_OUTPATIENT_CLINIC_OR_DEPARTMENT_OTHER): Payer: Self-pay | Admitting: Emergency Medicine

## 2021-01-20 DIAGNOSIS — S161XXA Strain of muscle, fascia and tendon at neck level, initial encounter: Secondary | ICD-10-CM

## 2021-01-20 DIAGNOSIS — S299XXA Unspecified injury of thorax, initial encounter: Secondary | ICD-10-CM

## 2021-01-20 MED ORDER — OXYCODONE-ACETAMINOPHEN 5-325 MG PO TABS: 1.0000 | ORAL_TABLET | ORAL | 0 refills | Status: AC | PRN

## 2021-01-20 MED ORDER — OXYCODONE-ACETAMINOPHEN 5-325 MG PO TABS
2.0000 | ORAL_TABLET | Freq: Once | ORAL | Status: AC
Start: 2021-01-20 — End: 2021-01-20
  Administered 2021-01-20: 2 via ORAL
  Filled 2021-01-20: qty 2
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
## 2021-03-29 ENCOUNTER — Ambulatory Visit (INDEPENDENT_AMBULATORY_CARE_PROVIDER_SITE_OTHER): Payer: Self-pay

## 2021-03-29 DIAGNOSIS — I63412 Cerebral infarction due to embolism of left middle cerebral artery: Secondary | ICD-10-CM

## 2021-03-29 LAB — CUP PACEART REMOTE DEVICE CHECK
Date Time Interrogation Session: 20230320002244
Implantable Pulse Generator Implant Date: 20210602

## 2021-04-06 NOTE — Progress Notes (Signed)
Carelink Summary Report / Loop Recorder 

## 2021-06-01 ENCOUNTER — Ambulatory Visit (INDEPENDENT_AMBULATORY_CARE_PROVIDER_SITE_OTHER): Payer: BC Managed Care – PPO

## 2021-06-01 DIAGNOSIS — I63412 Cerebral infarction due to embolism of left middle cerebral artery: Secondary | ICD-10-CM

## 2021-06-02 LAB — CUP PACEART REMOTE DEVICE CHECK
Date Time Interrogation Session: 20230523231724
Implantable Pulse Generator Implant Date: 20210602

## 2021-06-15 NOTE — Progress Notes (Signed)
Carelink Summary Report / Loop Recorder 

## 2021-06-22 ENCOUNTER — Other Ambulatory Visit: Payer: Self-pay

## 2021-06-23 ENCOUNTER — Other Ambulatory Visit: Payer: Self-pay | Admitting: Internal Medicine

## 2021-06-23 ENCOUNTER — Other Ambulatory Visit: Payer: Self-pay

## 2021-06-23 DIAGNOSIS — I10 Essential (primary) hypertension: Secondary | ICD-10-CM

## 2021-06-24 ENCOUNTER — Other Ambulatory Visit: Payer: Self-pay

## 2021-06-24 NOTE — Telephone Encounter (Signed)
Not a pt in this practice. Requested Prescriptions  Pending Prescriptions Disp Refills  . amLODipine (NORVASC) 10 MG tablet 90 tablet 3    Sig: Take 1 tablet (10 mg total) by mouth daily.     There is no refill protocol information for this order    . lisinopril-hydrochlorothiazide (ZESTORETIC) 10-12.5 MG tablet 90 tablet 3    Sig: Take 1 tablet by mouth daily.     There is no refill protocol information for this order

## 2021-06-29 ENCOUNTER — Other Ambulatory Visit: Payer: Self-pay

## 2021-06-29 ENCOUNTER — Ambulatory Visit (INDEPENDENT_AMBULATORY_CARE_PROVIDER_SITE_OTHER): Payer: BC Managed Care – PPO | Admitting: Nurse Practitioner

## 2021-06-29 ENCOUNTER — Encounter: Payer: Self-pay | Admitting: Nurse Practitioner

## 2021-06-29 VITALS — BP 160/102 | HR 79 | Temp 97.8°F | Ht 70.0 in | Wt 244.0 lb

## 2021-06-29 DIAGNOSIS — I10 Essential (primary) hypertension: Secondary | ICD-10-CM

## 2021-06-29 MED ORDER — LISINOPRIL-HYDROCHLOROTHIAZIDE 10-12.5 MG PO TABS
1.0000 | ORAL_TABLET | Freq: Every day | ORAL | 3 refills | Status: AC
  Filled 2021-06-29: qty 90, 90d supply, fill #0
  Filled 2021-10-29: qty 30, 30d supply, fill #1
  Filled 2022-01-07: qty 30, 30d supply, fill #2

## 2021-06-29 MED ORDER — AMLODIPINE BESYLATE 10 MG PO TABS
10.0000 mg | ORAL_TABLET | Freq: Every day | ORAL | 3 refills | Status: DC
  Filled 2021-06-29: qty 90, 90d supply, fill #0
  Filled 2021-10-29: qty 30, 30d supply, fill #1
  Filled 2022-01-07: qty 30, 30d supply, fill #2

## 2021-06-29 NOTE — Patient Instructions (Signed)
You were seen today in the [facility] for hypertension.  You were prescribed medications, please take as directed. Please follow up in 30 days for reevaluation of B/P and wellness visit

## 2021-06-29 NOTE — Progress Notes (Signed)
[facility] Patient [facility] [address] [place], [place]  [address] Phone:  [phone]   Fax:  [phone] Subjective:   Patient ID: William Ellison, male    DOB: [birth year], 61 y.o.   MRN: [id number]  Chief Complaint  Patient presents with   Follow-up    Pt is here for medication refill. Pt stated he ran out of his medication a month and a half ago   HPI William Ellison 61 y.o. male  has a past medical history of Acute ischemic stroke (HCC) (05/2019), Anxiety, Erectile dysfunction, Hypertension, and Skin rash. To the [facility] for reevaluation of hypertension.   Hypertension: Patient here for follow-up of elevated blood pressure. He is exercising and is adherent to low salt diet.  Denies checking B/P at home. Cardiac symptoms none. Patient denies chest pain, claudication, and dyspnea.  Cardiovascular risk factors: advanced age (older than 45 for men, 71 for women), hypertension, and male gender. Use of agents associated with hypertension: none. History of target organ damage: none. States that he has not had B/P medication in a month and a half. States that he was living out of state for a brief time, but has recently returned and will be permanently located in the area. Denies any other concerns today.   Denies any fatigue, chest pain, shortness of breath, HA or dizziness. Denies any blurred vision, numbness or tingling.  Past Medical History:  Diagnosis Date   Acute ischemic stroke (HCC) 05/2019   Anxiety    Erectile dysfunction    Hypertension    Skin rash     Past Surgical History:  Procedure Laterality Date   BUBBLE STUDY  06/12/2019   Procedure: BUBBLE STUDY;  Surgeon: Chilton Si, MD;  Location: [facility] ENDOSCOPY;  Service: Cardiovascular;;   LOOP RECORDER INSERTION N/A 06/12/2019   Procedure: LOOP RECORDER INSERTION;  Surgeon: Regan Lemming, MD;  Location: [facility] INVASIVE CV LAB;  Service: Cardiovascular;  Laterality: N/A;   TEE WITHOUT CARDIOVERSION N/A 06/12/2019    Procedure: TRANSESOPHAGEAL ECHOCARDIOGRAM (TEE);  Surgeon: Chilton Si, MD;  Location: [facility] ENDOSCOPY;  Service: Cardiovascular;  Laterality: N/A;    Family History  Problem Relation Age of Onset   Hypertension Mother     Social History   Socioeconomic History   Marital status: Significant Other    Spouse name: Not on file   Number of children: Not on file   Years of education: Not on file   Highest education level: Not on file  Occupational History   Not on file  Tobacco Use   Smoking status: Never   Smokeless tobacco: Never  Vaping Use   Vaping Use: Never used  Substance and Sexual Activity   Alcohol use: Not Currently   Drug use: Never   Sexual activity: Yes  Other Topics Concern   Not on file  Social History Narrative   Not on file   Social Determinants of Health   Financial Resource Strain: Not on file  Food Insecurity: Not on file  Transportation Needs: Not on file  Physical Activity: Not on file  Stress: Not on file  Social Connections: Not on file  Intimate Partner Violence: Not on file    Outpatient Medications Prior to Visit  Medication Sig Dispense Refill   atorvastatin (LIPITOR) 80 MG tablet Take 1 tablet (80 mg total) by mouth daily. (Patient not taking: Reported on 06/29/2021) 90 tablet 3   oxyCODONE-acetaminophen (PERCOCET) 5-325 MG tablet Take 1 tablet by mouth every 4 (four)  hours as needed for severe pain. (Patient not taking: Reported on 06/29/2021) 20 tablet 0   amLODipine (NORVASC) 10 MG tablet Take 1 tablet (10 mg total) by mouth daily. (Patient not taking: Reported on 06/29/2021) 90 tablet 3   clopidogrel (PLAVIX) 75 MG tablet Take 1 tablet (75 mg total) by mouth daily. (Patient not taking: Reported on 06/29/2021) 90 tablet 3   lisinopril-hydrochlorothiazide (ZESTORETIC) 10-12.5 MG tablet Take 1 tablet by mouth daily. (Patient not taking: Reported on 06/29/2021) 90 tablet 3   No facility-administered medications prior to visit.    No  Known Allergies  Review of Systems  Constitutional:  Negative for chills, fever and malaise/fatigue.  Eyes: Negative.   Respiratory:  Negative for cough and shortness of breath.   Cardiovascular:  Negative for chest pain, palpitations and leg swelling.  Gastrointestinal:  Negative for abdominal pain, blood in stool, constipation, diarrhea, nausea and vomiting.  Musculoskeletal: Negative.   Skin: Negative.   Neurological: Negative.   Psychiatric/Behavioral:  Negative for depression. The patient is not nervous/anxious.   All other systems reviewed and are negative.      Objective:    Physical Exam Vitals reviewed.  Constitutional:      General: He is not in acute distress.    Appearance: Normal appearance. He is obese.  HENT:     Head: Normocephalic.  Neck:     Vascular: No carotid bruit.  Cardiovascular:     Rate and Rhythm: Normal rate and regular rhythm.     Pulses: Normal pulses.     Heart sounds: Normal heart sounds.     Comments: No obvious peripheral edema Pulmonary:     Effort: Pulmonary effort is normal.     Breath sounds: Normal breath sounds.  Musculoskeletal:        General: No swelling, tenderness, deformity or signs of injury. Normal range of motion.     Cervical back: Normal range of motion. No rigidity or tenderness.     Right lower leg: No edema.     Left lower leg: No edema.  Lymphadenopathy:     Cervical: No cervical adenopathy.  Skin:    General: Skin is warm and dry.     Capillary Refill: Capillary refill takes less than 2 seconds.  Neurological:     General: No focal deficit present.     Mental Status: He is alert and oriented to person, place, and time.  Psychiatric:        Mood and Affect: Mood normal.        Behavior: Behavior normal.        Thought Content: Thought content normal.        Judgment: Judgment normal.     BP (!) 160/102 (BP Location: Left Arm, Patient Position: Sitting, Cuff Size: Large)   Pulse 79   Temp 97.8 F (36.6 C)    Ht 5\' 10"  (1.778 m)   Wt 244 lb (110.7 kg)   SpO2 100%   BMI 35.01 kg/m  Wt Readings from Last 3 Encounters:  06/29/21 244 lb (110.7 kg)  01/20/21 236 lb (107 kg)  04/17/20 242 lb (109.8 kg)    Immunization History  Administered Date(s) Administered   Moderna Sars-Covid-2 Vaccination 08/19/2019    Diabetic Foot Exam - Simple   No data filed     No results found for: "TSH" Lab Results  Component Value Date   WBC 5.9 06/17/2019   HGB 15.7 06/17/2019   HCT 46.8 06/17/2019   MCV 93.4  06/17/2019   PLT 320 06/17/2019   Lab Results  Component Value Date   NA 139 06/17/2019   K 3.6 06/17/2019   CO2 30 06/17/2019   GLUCOSE 129 (H) 06/17/2019   BUN 11 06/17/2019   CREATININE 1.26 (H) 06/17/2019   BILITOT 1.4 (H) 06/13/2019   ALKPHOS 47 06/13/2019   AST 26 06/13/2019   ALT 36 06/13/2019   PROT 7.1 06/13/2019   ALBUMIN 3.6 06/13/2019   CALCIUM 9.5 06/17/2019   ANIONGAP 9 06/17/2019   Lab Results  Component Value Date   CHOL 198 06/07/2019   Lab Results  Component Value Date   HDL 37 (L) 06/07/2019   Lab Results  Component Value Date   LDLCALC 148 (H) 06/07/2019   Lab Results  Component Value Date   TRIG 67 06/07/2019   Lab Results  Component Value Date   CHOLHDL 5.4 06/07/2019   Lab Results  Component Value Date   HGBA1C 6.2 (H) 06/07/2019   HGBA1C 6.1 (A) 06/20/2018       Assessment & Plan:   Problem List Items Addressed This Visit       Cardiovascular and Mediastinum   Hypertension   Relevant Medications   amLODipine (NORVASC) 10 MG tablet   lisinopril-hydrochlorothiazide (ZESTORETIC) 10-12.5 MG tablet Medications refilled without change, unable to evaluate effectiveness due to compliance  Encouraged continued diet and exercise efforts  Encouraged continued compliance with medication  Encouraged to monitor B/P at home    Benign essential HTN - Primary   Relevant Medications   amLODipine (NORVASC) 10 MG tablet    lisinopril-hydrochlorothiazide (ZESTORETIC) 10-12.5 MG tablet  Follow up in 1 mth for reevaluation of B/P and wellness exam, sooner as needed     I am having Park Breed maintain his atorvastatin, oxyCODONE-acetaminophen, amLODipine, and lisinopril-hydrochlorothiazide.  Meds ordered this encounter  Medications   amLODipine (NORVASC) 10 MG tablet    Sig: Take 1 tablet (10 mg total) by mouth daily.    Dispense:  90 tablet    Refill:  3   lisinopril-hydrochlorothiazide (ZESTORETIC) 10-12.5 MG tablet    Sig: Take 1 tablet by mouth daily.    Dispense:  90 tablet    Refill:  3     Teena Dunk, NP

## 2021-06-30 ENCOUNTER — Other Ambulatory Visit: Payer: Self-pay | Admitting: Family Medicine

## 2021-06-30 ENCOUNTER — Other Ambulatory Visit: Payer: Self-pay

## 2021-06-30 ENCOUNTER — Other Ambulatory Visit: Payer: Self-pay | Admitting: Internal Medicine

## 2021-06-30 DIAGNOSIS — I10 Essential (primary) hypertension: Secondary | ICD-10-CM

## 2021-06-30 MED ORDER — CLOPIDOGREL BISULFATE 75 MG PO TABS
75.0000 mg | ORAL_TABLET | Freq: Every day | ORAL | 3 refills | Status: AC
  Filled 2021-06-30: qty 90, 90d supply, fill #0
  Filled 2021-10-29: qty 30, 30d supply, fill #1
  Filled 2022-01-07: qty 30, 30d supply, fill #2

## 2021-07-01 ENCOUNTER — Other Ambulatory Visit: Payer: Self-pay

## 2021-07-01 MED ORDER — ATORVASTATIN CALCIUM 80 MG PO TABS
80.0000 mg | ORAL_TABLET | Freq: Every day | ORAL | 0 refills | Status: AC
  Filled 2021-07-01: qty 30, 30d supply, fill #0
  Filled 2021-09-07: qty 30, 30d supply, fill #1
  Filled 2021-10-29: qty 30, 30d supply, fill #2

## 2021-07-05 ENCOUNTER — Ambulatory Visit (INDEPENDENT_AMBULATORY_CARE_PROVIDER_SITE_OTHER): Payer: BC Managed Care – PPO

## 2021-07-05 DIAGNOSIS — I63412 Cerebral infarction due to embolism of left middle cerebral artery: Secondary | ICD-10-CM | POA: Diagnosis not present

## 2021-07-06 LAB — CUP PACEART REMOTE DEVICE CHECK
Date Time Interrogation Session: 20230625233351
Implantable Pulse Generator Implant Date: 20210602

## 2021-07-29 ENCOUNTER — Ambulatory Visit: Payer: BC Managed Care – PPO | Admitting: Nurse Practitioner

## 2021-07-29 NOTE — Progress Notes (Signed)
Carelink Summary Report / Loop Recorder 

## 2021-08-06 ENCOUNTER — Ambulatory Visit (INDEPENDENT_AMBULATORY_CARE_PROVIDER_SITE_OTHER): Payer: Commercial Managed Care - HMO

## 2021-08-06 DIAGNOSIS — I63412 Cerebral infarction due to embolism of left middle cerebral artery: Secondary | ICD-10-CM

## 2021-08-09 LAB — CUP PACEART REMOTE DEVICE CHECK
Date Time Interrogation Session: 20230728233542
Implantable Pulse Generator Implant Date: 20210602

## 2021-08-18 ENCOUNTER — Ambulatory Visit: Payer: BC Managed Care – PPO | Admitting: Nurse Practitioner

## 2021-08-25 NOTE — Progress Notes (Signed)
Carelink Summary Report / Loop Recorder 

## 2021-09-07 ENCOUNTER — Ambulatory Visit (INDEPENDENT_AMBULATORY_CARE_PROVIDER_SITE_OTHER): Payer: Commercial Managed Care - HMO

## 2021-09-07 ENCOUNTER — Other Ambulatory Visit: Payer: Self-pay

## 2021-09-07 DIAGNOSIS — I63412 Cerebral infarction due to embolism of left middle cerebral artery: Secondary | ICD-10-CM

## 2021-09-08 ENCOUNTER — Ambulatory Visit: Payer: BC Managed Care – PPO | Admitting: Nurse Practitioner

## 2021-09-14 LAB — CUP PACEART REMOTE DEVICE CHECK
Date Time Interrogation Session: 20230831213150
Implantable Pulse Generator Implant Date: 20210602

## 2021-10-01 NOTE — Progress Notes (Signed)
Carelink Summary Report / Loop Recorder 

## 2021-10-11 ENCOUNTER — Ambulatory Visit: Payer: Commercial Managed Care - HMO | Attending: *Deleted

## 2021-10-11 DIAGNOSIS — I63412 Cerebral infarction due to embolism of left middle cerebral artery: Secondary | ICD-10-CM | POA: Diagnosis not present

## 2021-10-12 LAB — CUP PACEART REMOTE DEVICE CHECK
Date Time Interrogation Session: 20231002233933
Implantable Pulse Generator Implant Date: 20210602

## 2021-10-25 NOTE — Progress Notes (Signed)
Carelink Summary Report / Loop Recorder 

## 2021-10-29 ENCOUNTER — Other Ambulatory Visit: Payer: Self-pay

## 2021-11-15 ENCOUNTER — Ambulatory Visit (INDEPENDENT_AMBULATORY_CARE_PROVIDER_SITE_OTHER): Payer: Commercial Managed Care - HMO

## 2021-11-15 DIAGNOSIS — I63412 Cerebral infarction due to embolism of left middle cerebral artery: Secondary | ICD-10-CM | POA: Diagnosis not present

## 2021-11-15 LAB — CUP PACEART REMOTE DEVICE CHECK
Date Time Interrogation Session: 20231104234409
Implantable Pulse Generator Implant Date: 20210602

## 2021-12-14 NOTE — Progress Notes (Signed)
Carelink Summary Report / Loop Recorder 

## 2021-12-20 ENCOUNTER — Ambulatory Visit (INDEPENDENT_AMBULATORY_CARE_PROVIDER_SITE_OTHER): Payer: Self-pay

## 2021-12-20 DIAGNOSIS — I63412 Cerebral infarction due to embolism of left middle cerebral artery: Secondary | ICD-10-CM

## 2021-12-20 LAB — CUP PACEART REMOTE DEVICE CHECK
Date Time Interrogation Session: 20231210232243
Implantable Pulse Generator Implant Date: 20210602

## 2022-01-07 ENCOUNTER — Other Ambulatory Visit: Payer: Self-pay | Admitting: Nurse Practitioner

## 2022-01-07 ENCOUNTER — Other Ambulatory Visit: Payer: Self-pay

## 2022-01-24 ENCOUNTER — Ambulatory Visit (INDEPENDENT_AMBULATORY_CARE_PROVIDER_SITE_OTHER): Payer: Self-pay

## 2022-01-24 DIAGNOSIS — I63412 Cerebral infarction due to embolism of left middle cerebral artery: Secondary | ICD-10-CM

## 2022-01-24 NOTE — Progress Notes (Signed)
Carelink Summary Report / Loop Recorder

## 2022-01-25 LAB — CUP PACEART REMOTE DEVICE CHECK
Date Time Interrogation Session: 20240112232200
Implantable Pulse Generator Implant Date: 20210602

## 2022-02-25 LAB — CUP PACEART REMOTE DEVICE CHECK
Date Time Interrogation Session: 20240214233820
Implantable Pulse Generator Implant Date: 20210602

## 2022-02-28 ENCOUNTER — Ambulatory Visit: Payer: Commercial Managed Care - HMO

## 2022-02-28 DIAGNOSIS — I63412 Cerebral infarction due to embolism of left middle cerebral artery: Secondary | ICD-10-CM

## 2022-03-03 NOTE — Progress Notes (Signed)
Carelink Summary Report / Loop Recorder 

## 2022-03-20 ENCOUNTER — Other Ambulatory Visit: Payer: Self-pay

## 2022-03-20 ENCOUNTER — Emergency Department (HOSPITAL_COMMUNITY)
Admission: EM | Admit: 2022-03-20 | Discharge: 2022-03-20 | Disposition: A | Discharge status: Home / Self Care | Payer: Commercial Managed Care - HMO | Attending: Emergency Medicine | Admitting: Emergency Medicine

## 2022-03-20 ENCOUNTER — Emergency Department (HOSPITAL_COMMUNITY): Payer: Commercial Managed Care - HMO

## 2022-03-20 DIAGNOSIS — S51811A Laceration without foreign body of right forearm, initial encounter: Secondary | ICD-10-CM | POA: Insufficient documentation

## 2022-03-20 DIAGNOSIS — Z23 Encounter for immunization: Secondary | ICD-10-CM | POA: Insufficient documentation

## 2022-03-20 DIAGNOSIS — W311XXA Contact with metalworking machines, initial encounter: Secondary | ICD-10-CM | POA: Diagnosis not present

## 2022-03-20 DIAGNOSIS — Z7902 Long term (current) use of antithrombotics/antiplatelets: Secondary | ICD-10-CM | POA: Diagnosis not present

## 2022-03-20 MED ORDER — CEPHALEXIN 500 MG PO CAPS: 500.0000 mg | ORAL_CAPSULE | Freq: Three times a day (TID) | ORAL | 0 refills | Status: AC

## 2022-03-20 MED ORDER — LIDOCAINE-EPINEPHRINE (PF) 2 %-1:200000 IJ SOLN
10.0000 mL | Freq: Once | INTRAMUSCULAR | Status: DC
  Filled 2022-03-20: qty 20

## 2022-03-20 MED ORDER — TETANUS-DIPHTH-ACELL PERTUSSIS 5-2.5-18.5 LF-MCG/0.5 IM SUSY
0.5000 mL | PREFILLED_SYRINGE | Freq: Once | INTRAMUSCULAR | Status: AC
  Administered 2022-03-20: 0.5 mL via INTRAMUSCULAR
  Filled 2022-03-20: qty 0.5

## 2022-03-20 NOTE — ED Triage Notes (Signed)
Lac to right forearm pt states was working with skill saw and "had an accident"

## 2022-03-20 NOTE — ED Provider Notes (Signed)
[place] EMERGENCY DEPARTMENT AT [facility] Provider Note   CSN: [id number] Arrival date & time: 03/20/22  1235     History  Chief Complaint  Patient presents with   Extremity Laceration    William Ellison is a 62 y.o. male.  62 year old male with laceration to the right forearm from a skill saw just prior to arrival today. Last td unknown, is right hand dominant, denies weakness or numbness in the hand. No other injuries.        Home Medications Prior to Admission medications   Medication Sig Start Date End Date Taking? Authorizing Provider  cephALEXin (KEFLEX) 500 MG capsule Take 1 capsule (500 mg total) by mouth 3 (three) times daily for 5 days. 03/20/22 03/25/22 Yes Tacy Learn, PA-C  amLODipine (NORVASC) 10 MG tablet Take 1 tablet (10 mg total) by mouth daily. 06/29/21   Passmore, Jake Church I, NP  atorvastatin (LIPITOR) 80 MG tablet Take 1 tablet (80 mg total) by mouth daily. Patient not taking: Reported on 06/29/2021 03/20/20   Azzie Glatter, FNP  atorvastatin (LIPITOR) 80 MG tablet TAKE 1 TABLET (80 MG TOTAL) BY MOUTH DAILY. 07/01/21 07/01/22  Bo Merino I, NP  clopidogrel (PLAVIX) 75 MG tablet Take 1 tablet (75 mg total) by mouth daily. 06/30/21   Fenton Foy, NP  lisinopril-hydrochlorothiazide (ZESTORETIC) 10-12.5 MG tablet Take 1 tablet by mouth daily. 06/29/21   Passmore, Jake Church I, NP  oxyCODONE-acetaminophen (PERCOCET) 5-325 MG tablet Take 1 tablet by mouth every 4 (four) hours as needed for severe pain. Patient not taking: Reported on 06/29/2021 01/20/21   Molpus, Jenny Reichmann, MD      Allergies    Patient has no known allergies.    Review of Systems   Review of Systems Negative except as per HPI Physical Exam Updated Vital Signs BP (!) 149/100 (BP Location: Left Arm)   Pulse 82   Temp 98.1 F (36.7 C) (Oral)   Resp 18   Ht '5\' 10"'$  (1.778 m)   Wt 108.9 kg   SpO2 100%   BMI 34.44 kg/m  Physical Exam Vitals and nursing note reviewed.   Constitutional:      General: He is not in acute distress.    Appearance: He is well-developed. He is not diaphoretic.  HENT:     Head: Normocephalic and atraumatic.  Pulmonary:     Effort: Pulmonary effort is normal.  Musculoskeletal:        General: Signs of injury present. No swelling or deformity. Normal range of motion.  Skin:    General: Skin is warm and dry.     Findings: No erythema or rash.  Neurological:     Mental Status: He is alert and oriented to person, place, and time.     Sensory: No sensory deficit.     Motor: No weakness.  Psychiatric:        Behavior: Behavior normal.     ED Results / Procedures / Treatments   Labs (all labs ordered are listed, but only abnormal results are displayed) Labs Reviewed - No data to display  EKG None  Radiology DG Forearm Right  Result Date: 03/20/2022 CLINICAL DATA:  Laceration. EXAM: RIGHT FOREARM - 2 VIEW COMPARISON:  None Available. FINDINGS: No fracture. Mild soft tissue swelling and irregularity across the distal forearm without underlying bony abnormality. IMPRESSION: Negative for fracture. Electronically Signed   By: Emmit Alexanders M.D.   On: 03/20/2022 13:57    Procedures .Marland KitchenLaceration Repair  Date/Time: 03/20/2022 3:25 PM  Performed by: Tacy Learn, PA-C Authorized by: Tacy Learn, PA-C   Consent:    Consent obtained:  Verbal   Consent given by:  Patient   Risks, benefits, and alternatives were discussed: yes     Risks discussed:  Infection, pain, poor cosmetic result, poor wound healing, tendon damage and retained foreign body   Alternatives discussed:  No treatment Universal protocol:    Patient identity confirmed:  Verbally with patient Anesthesia:    Anesthesia method:  Local infiltration   Local anesthetic:  Lidocaine 2% WITH epi Laceration details:    Location:  Shoulder/arm   Shoulder/arm location:  R lower arm   Length (cm):  4   Depth (mm):  5 Pre-procedure details:    Preparation:   Patient was prepped and draped in usual sterile fashion and imaging obtained to evaluate for foreign bodies Exploration:    Hemostasis achieved with:  Epinephrine   Imaging obtained: x-ray     Imaging outcome: foreign body not noted     Wound exploration: wound explored through full range of motion and entire depth of wound visualized     Wound extent: fascia violated     Wound extent: no foreign body, no signs of injury, no tendon damage and no underlying fracture     Contaminated: no   Treatment:    Area cleansed with:  Saline   Amount of cleaning:  Extensive   Irrigation solution:  Sterile saline Skin repair:    Repair method:  Sutures   Suture size:  4-0   Suture material:  Prolene   Suture technique:  Simple interrupted   Number of sutures:  8 Approximation:    Approximation:  Close Repair type:    Repair type:  Simple Post-procedure details:    Dressing:  Non-adherent dressing   Procedure completion:  Tolerated well, no immediate complications     Medications Ordered in ED Medications  lidocaine-EPINEPHrine (XYLOCAINE W/EPI) 2 %-1:200000 (PF) injection 10 mL (has no administration in time range)  Tdap (BOOSTRIX) injection 0.5 mL (0.5 mLs Intramuscular Given 03/20/22 1318)    ED Course/ Medical Decision Making/ A&P                             Medical Decision Making Amount and/or Complexity of Data Reviewed Radiology: ordered.  Risk Prescription drug management.   62 year old male presents for evaluation of laceration of the right forearm as above.  Last tetanus is unknown and was updated today.  X-ray of the right forearm as ordered per myself is negative for obvious retained foreign body or bony injury, agree with radiologist interpretation.  Wound was anesthetized, thoroughly irrigated, viewed through full range of motion without obvious tendon involvement, no distal deficits.  Wound was closed with sutures.  Prescribed Keflex due to depth of wound with saw.   Recommend wound recheck in 2 days, sooner if concerns for infection exist.  Suture removal in 10 to 12 days.  Patient and family verbalized understanding of plan of care.        Final Clinical Impression(s) / ED Diagnoses Final diagnoses:  Laceration of right forearm, initial encounter    Rx / DC Orders ED Discharge Orders          Ordered    cephALEXin (KEFLEX) 500 MG capsule  3 times daily        03/20/22 1500  Tacy Learn, PA-C 03/20/22 1528    Hayden Rasmussen, MD 03/21/22 1002

## 2022-03-20 NOTE — Discharge Instructions (Addendum)
Keflex as prescribed and complete the full course. Wound check in 2 days, can be done at urgent care. Suture removal in 10-12 days.

## 2022-03-21 ENCOUNTER — Telehealth: Payer: Self-pay

## 2022-03-21 NOTE — Transitions of Care (Post Inpatient/ED Visit) (Signed)
   03/21/2022  Name: William Ellison MRN: [id number] DOB: March 29, 1960  Today's TOC FU Call Status: Today's TOC FU Call Status:: Unsuccessul Call (1st Attempt) Unsuccessful Call (1st Attempt) Date: 03/21/22  Attempted to reach the patient regarding the most recent Inpatient/ED visit.  Follow Up Plan: Additional outreach attempts will be made to reach the patient to complete the Transitions of Care (Post Inpatient/ED visit) call.   Ballinger RMA

## 2022-03-25 ENCOUNTER — Telehealth: Payer: Self-pay

## 2022-03-25 ENCOUNTER — Other Ambulatory Visit: Payer: Self-pay

## 2022-03-25 DIAGNOSIS — I10 Essential (primary) hypertension: Secondary | ICD-10-CM

## 2022-03-25 MED ORDER — AMLODIPINE BESYLATE 10 MG PO TABS
10.0000 mg | ORAL_TABLET | Freq: Every day | ORAL | 3 refills | Status: AC
  Filled 2022-03-25: qty 90, 90d supply, fill #0

## 2022-03-25 NOTE — Transitions of Care (Post Inpatient/ED Visit) (Signed)
   03/25/2022  Name: William Ellison MRN: [id number] DOB: 1960/02/21  Today's TOC FU Call Status: Today's TOC FU Call Status:: Successful TOC FU Call Competed TOC FU Call Complete Date: 03/25/22  Transition Care Management Follow-up Telephone Call Discharge Facility: [facility]) Type of Discharge: Emergency Department Reason for ED Visit: Other: (cut with saw) How have you been since you were released from the hospital?: Better Any questions or concerns?: No  Items Reviewed: Did you receive and understand the discharge instructions provided?: Yes Medications obtained and verified?: Yes (Medications Reviewed) Any new allergies since your discharge?: No Dietary orders reviewed?: NA Do you have support at home?: Yes People in Home: spouse  Home Care and Equipment/Supplies: [employer] Ordered?: NA Any new equipment or medical supplies ordered?: NA  Functional Questionnaire: Do you need assistance with bathing/showering or dressing?: No Do you need assistance with meal preparation?: No Do you need assistance with eating?: No Do you have difficulty maintaining continence: No Do you need assistance with getting out of bed/getting out of a chair/moving?: No Do you have difficulty managing or taking your medications?: No  Folllow up appointments reviewed: PCP Follow-up appointment confirmed?: Yes Date of PCP follow-up appointment?: 03/30/22 Follow-up Provider: [facility] Follow-up appointment confirmed?: NA Do you need transportation to your follow-up appointment?: No Do you understand care options if your condition(s) worsen?: Yes-patient verbalized understanding    [place] RMA

## 2022-03-29 ENCOUNTER — Ambulatory Visit (INDEPENDENT_AMBULATORY_CARE_PROVIDER_SITE_OTHER): Payer: Commercial Managed Care - HMO

## 2022-03-29 DIAGNOSIS — I63412 Cerebral infarction due to embolism of left middle cerebral artery: Secondary | ICD-10-CM

## 2022-03-30 ENCOUNTER — Inpatient Hospital Stay: Payer: Commercial Managed Care - HMO | Admitting: Nurse Practitioner

## 2022-03-30 LAB — CUP PACEART REMOTE DEVICE CHECK
Date Time Interrogation Session: 20240319003653
Implantable Pulse Generator Implant Date: 20210602

## 2022-03-31 ENCOUNTER — Other Ambulatory Visit: Payer: Self-pay

## 2022-04-04 DIAGNOSIS — I63412 Cerebral infarction due to embolism of left middle cerebral artery: Secondary | ICD-10-CM

## 2022-04-11 NOTE — Progress Notes (Signed)
Carelink Summary Report / Loop Recorder 

## 2022-05-02 ENCOUNTER — Ambulatory Visit: Payer: Commercial Managed Care - HMO

## 2022-05-02 DIAGNOSIS — I63412 Cerebral infarction due to embolism of left middle cerebral artery: Secondary | ICD-10-CM

## 2022-05-02 LAB — CUP PACEART REMOTE DEVICE CHECK
Date Time Interrogation Session: 20240421003638
Implantable Pulse Generator Implant Date: 20210602

## 2022-05-09 NOTE — Progress Notes (Signed)
Carelink Summary Report / Loop Recorder 

## 2022-06-02 NOTE — Progress Notes (Signed)
Carelink Summary Report / Loop Recorder 

## 2022-06-03 LAB — CUP PACEART REMOTE DEVICE CHECK
Date Time Interrogation Session: 20240524003920
Implantable Pulse Generator Implant Date: 20210602

## 2022-06-07 ENCOUNTER — Other Ambulatory Visit (HOSPITAL_COMMUNITY): Payer: Self-pay

## 2022-06-07 ENCOUNTER — Ambulatory Visit (INDEPENDENT_AMBULATORY_CARE_PROVIDER_SITE_OTHER): Payer: Commercial Managed Care - HMO

## 2022-06-07 DIAGNOSIS — I63412 Cerebral infarction due to embolism of left middle cerebral artery: Secondary | ICD-10-CM

## 2022-06-29 NOTE — Progress Notes (Signed)
Carelink Summary Report / Loop Recorder 

## 2022-07-07 LAB — CUP PACEART REMOTE DEVICE CHECK
Date Time Interrogation Session: 20240626004216
Implantable Pulse Generator Implant Date: 20210602

## 2022-07-11 ENCOUNTER — Ambulatory Visit (INDEPENDENT_AMBULATORY_CARE_PROVIDER_SITE_OTHER): Payer: BC Managed Care – PPO

## 2022-07-11 DIAGNOSIS — I63412 Cerebral infarction due to embolism of left middle cerebral artery: Secondary | ICD-10-CM

## 2022-07-27 NOTE — Progress Notes (Signed)
 Carelink Summary Report / Loop Recorder 

## 2022-08-15 ENCOUNTER — Ambulatory Visit (INDEPENDENT_AMBULATORY_CARE_PROVIDER_SITE_OTHER): Payer: BC Managed Care – PPO

## 2022-08-15 DIAGNOSIS — I63412 Cerebral infarction due to embolism of left middle cerebral artery: Secondary | ICD-10-CM

## 2022-08-24 IMAGING — CT CT CERVICAL SPINE W/O CM
3 of 4 series · 12 of 33 positions shown, 14 images · non-contrast
Comparison: None.

CLINICAL DATA: Neck trauma.



[Series 5: sagittal bone · sagittal · 0.26mm/px · 5 of 61 slices shown, 6 images]
[im 21/61  bone]
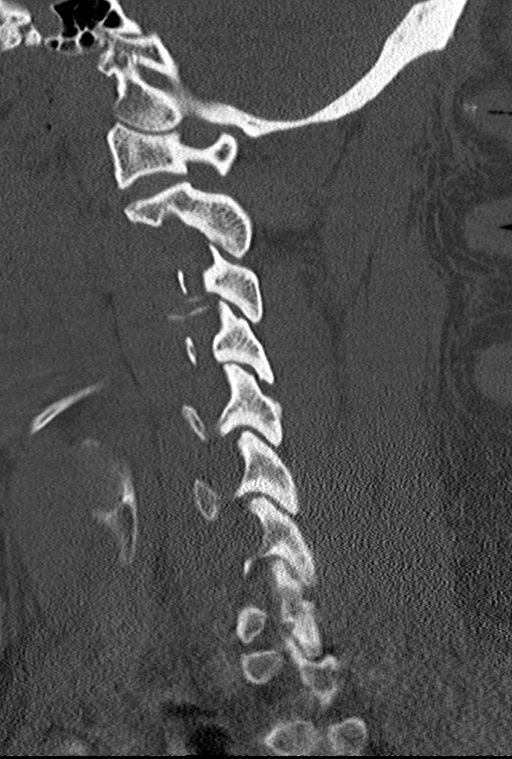
[im 26/61  bone]
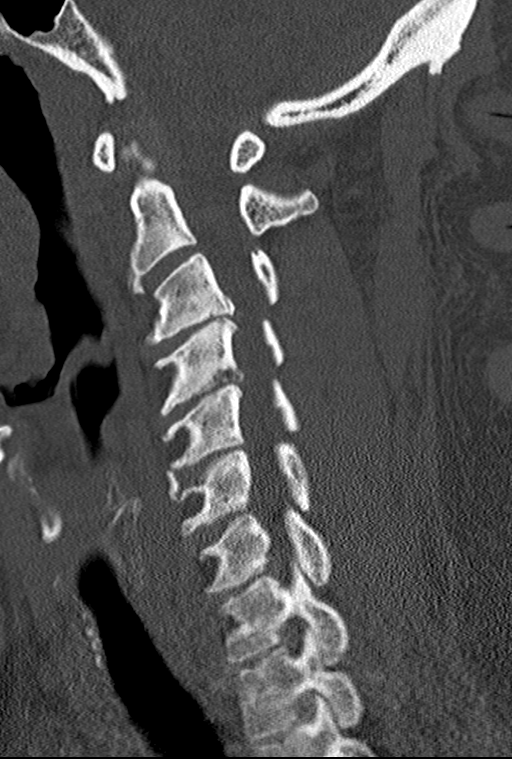
[im 31/61  soft-tissue]
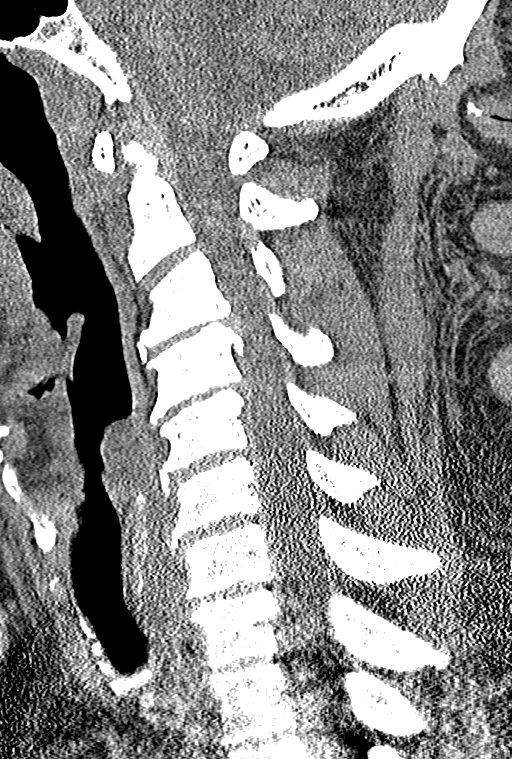
[im 31/61  bone]
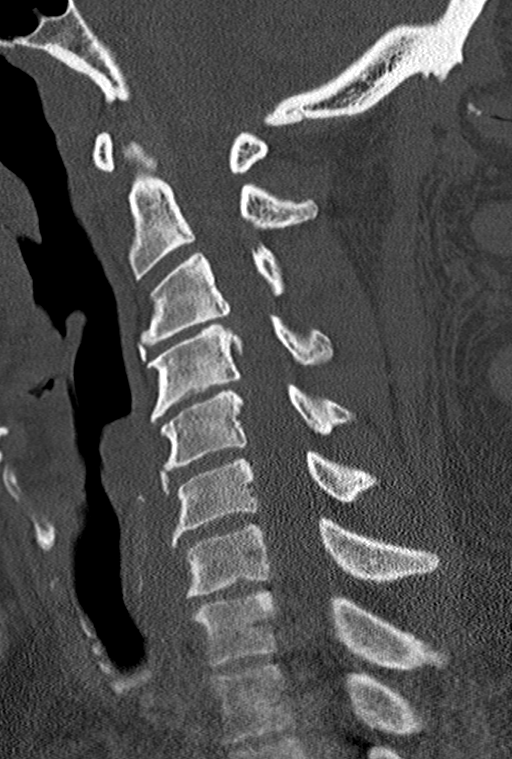
[im 36/61  bone]
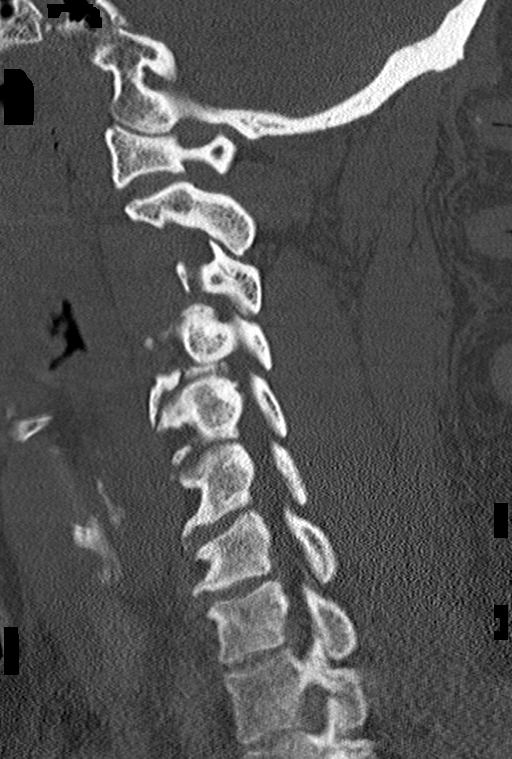
[im 41/61  bone]
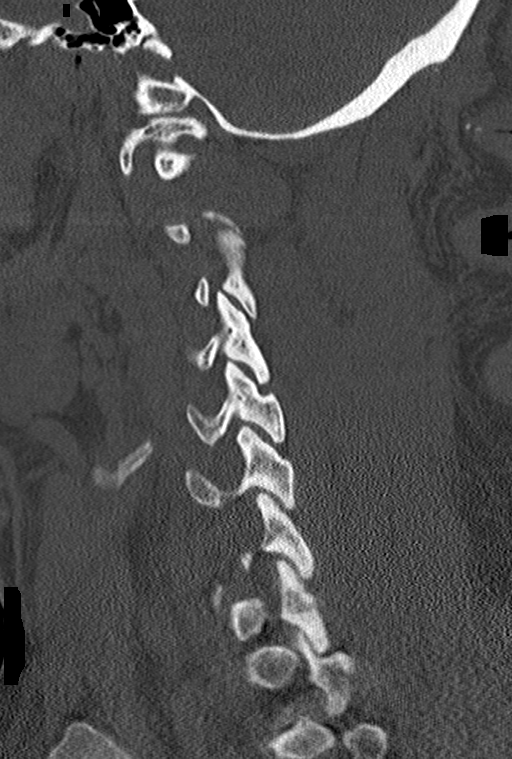

[Series 6: coronal bone · coronal · 0.29mm/px · 3 of 51 slices shown]
[im 11/51  bone]
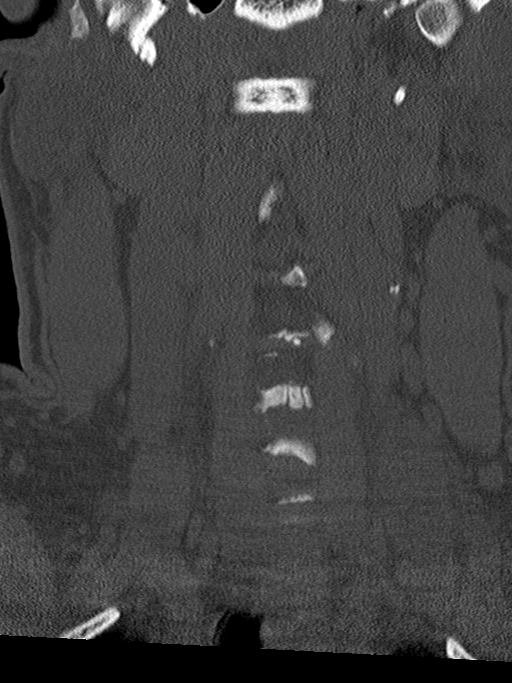
[im 21/51  bone]
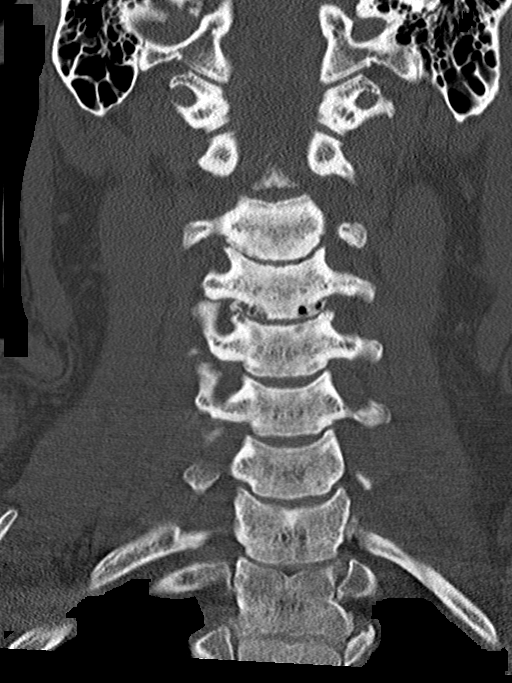
[im 31/51  bone]
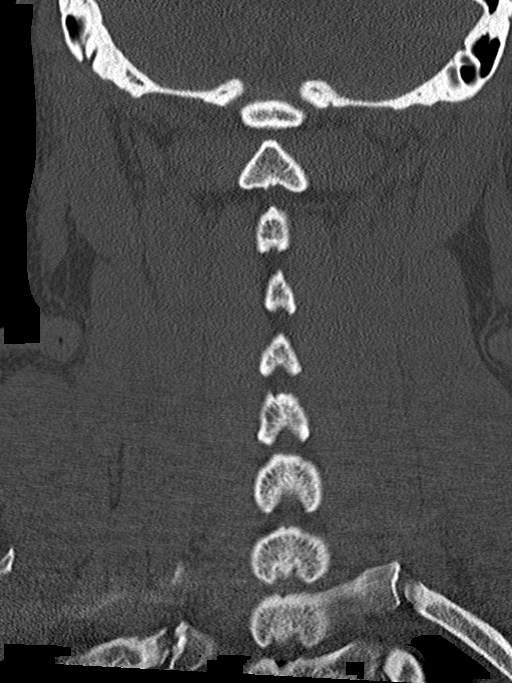

[Series 7: orthogonal bone · axial · 0.18mm/px · z∈[-270,-144]mm · 4 of 99 slices shown, 5 images]
[im 17/99  soft-tissue]
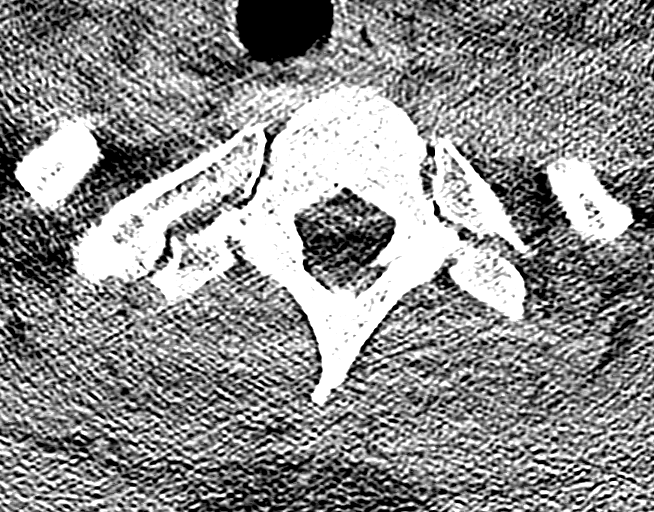
[im 17/99  bone]
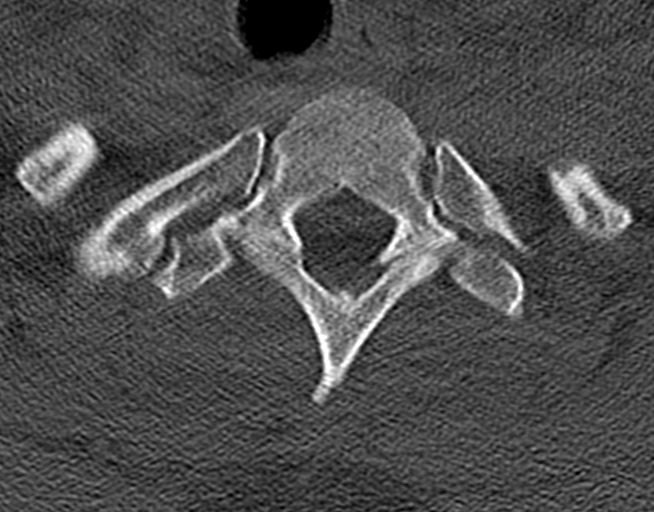
[im 33/99  bone]
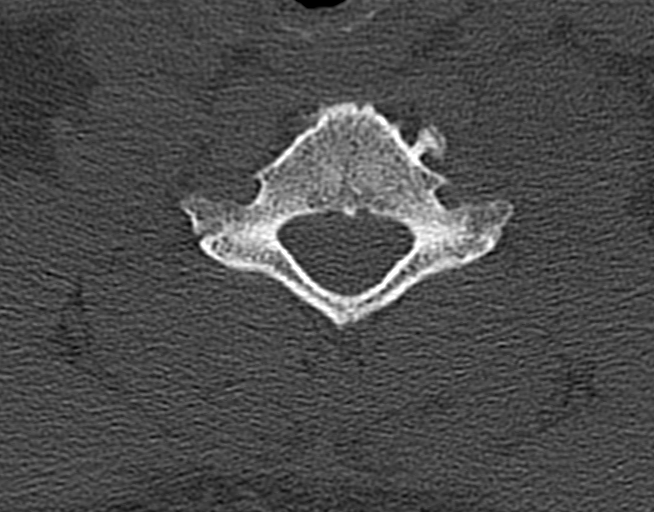
[im 66/99  bone]
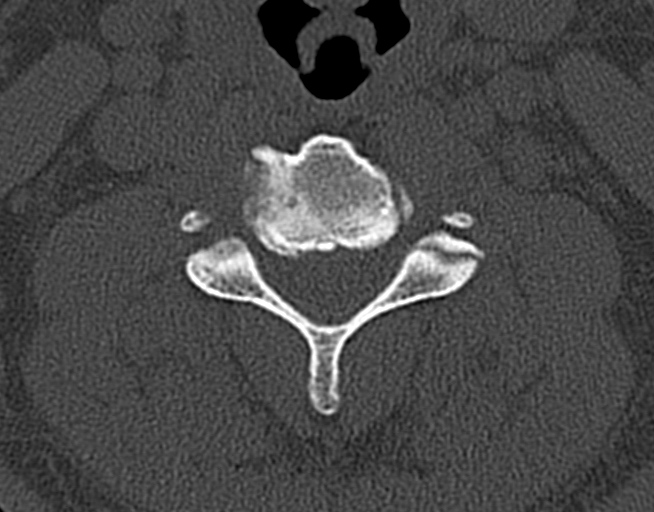
[im 82/99  bone]
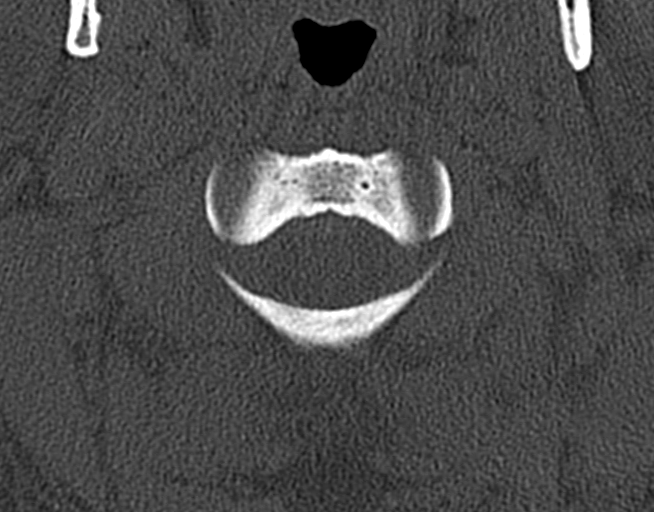

[12 of 33 positions shown; findings below may reference images not displayed]

FINDINGS: Alignment: No acute subluxation. There is straightening of normal
cervical lordosis which may be positional or due to muscle spasm.

Skull base and vertebrae: No acute fracture.

Soft tissues and spinal canal: No prevertebral fluid or swelling. No
visible canal hematoma.

Disc levels: Multilevel degenerative changes with disc space
narrowing and spurring.

Upper chest: Negative.

Other: None
IMPRESSION: 1. No acute fracture or subluxation of the cervical spine.
2. Multilevel degenerative changes.

## 2022-08-25 NOTE — Progress Notes (Signed)
Carelink Summary Report / Loop Recorder 

## 2022-09-19 ENCOUNTER — Ambulatory Visit (INDEPENDENT_AMBULATORY_CARE_PROVIDER_SITE_OTHER): Payer: BC Managed Care – PPO

## 2022-09-19 DIAGNOSIS — I63412 Cerebral infarction due to embolism of left middle cerebral artery: Secondary | ICD-10-CM | POA: Diagnosis not present

## 2022-09-19 LAB — CUP PACEART REMOTE DEVICE CHECK
Date Time Interrogation Session: 20240906230403
Implantable Pulse Generator Implant Date: 20210602

## 2022-09-29 NOTE — Progress Notes (Signed)
Carelink Summary Report / Loop Recorder

## 2022-10-24 ENCOUNTER — Ambulatory Visit (INDEPENDENT_AMBULATORY_CARE_PROVIDER_SITE_OTHER): Payer: Commercial Managed Care - HMO

## 2022-10-24 DIAGNOSIS — I63412 Cerebral infarction due to embolism of left middle cerebral artery: Secondary | ICD-10-CM

## 2022-10-24 LAB — CUP PACEART REMOTE DEVICE CHECK
Date Time Interrogation Session: 20241013230512
Implantable Pulse Generator Implant Date: 20210602

## 2022-11-07 NOTE — Progress Notes (Signed)
Carelink Summary Report / Loop Recorder 

## 2022-11-28 ENCOUNTER — Ambulatory Visit: Payer: Self-pay | Attending: Nurse Practitioner

## 2023-02-23 ENCOUNTER — Other Ambulatory Visit (HOSPITAL_BASED_OUTPATIENT_CLINIC_OR_DEPARTMENT_OTHER): Payer: Self-pay

## 2023-05-08 ENCOUNTER — Telehealth: Payer: Self-pay

## 2023-05-08 NOTE — Telephone Encounter (Signed)
 ILR reached RRT: 01/24/23  Marked "I" in Paceart: Done Enter note in Paceart: Done Canceled future remotes: Done Discontinued from website: Done Entered in Speciality Comments:  Advised if further questions arise to please call the device clinic at [phone].  Attempted to contact patient. No answer, left message to call back.

## 2023-05-09 NOTE — Telephone Encounter (Signed)
Attempted to contact patient. No answer, left message to call back

## 2023-05-10 NOTE — Telephone Encounter (Signed)
 Outreach made to Pt.  Left VM.  Will send letter to Pt advising of options now that loop RRT.  Await further needs.
